# Patient Record
Sex: Female | Born: 1945
Health system: Southern US, Community
[De-identification: ages and names within clinical notes are randomized; demographics above are authoritative.]

## PROBLEM LIST (undated history)

## (undated) DIAGNOSIS — H269 Unspecified cataract: Secondary | ICD-10-CM

## (undated) DIAGNOSIS — E119 Type 2 diabetes mellitus without complications: Secondary | ICD-10-CM

## (undated) DIAGNOSIS — D332 Benign neoplasm of brain, unspecified: Secondary | ICD-10-CM

## (undated) DIAGNOSIS — J45909 Unspecified asthma, uncomplicated: Secondary | ICD-10-CM

## (undated) DIAGNOSIS — I10 Essential (primary) hypertension: Secondary | ICD-10-CM

## (undated) DIAGNOSIS — C55 Malignant neoplasm of uterus, part unspecified: Secondary | ICD-10-CM

## (undated) DIAGNOSIS — K219 Gastro-esophageal reflux disease without esophagitis: Secondary | ICD-10-CM

## (undated) DIAGNOSIS — R569 Unspecified convulsions: Secondary | ICD-10-CM

## (undated) DIAGNOSIS — Z9981 Dependence on supplemental oxygen: Secondary | ICD-10-CM

## (undated) DIAGNOSIS — J3089 Other allergic rhinitis: Secondary | ICD-10-CM

## (undated) HISTORY — PX: PARTIAL HYSTERECTOMY: SHX80

## (undated) HISTORY — PX: BRAIN SURGERY: SHX531

## (undated) HISTORY — DX: Malignant neoplasm of uterus, part unspecified: C55

## (undated) HISTORY — PX: DIAGNOSTIC LAPAROSCOPY: SUR761

## (undated) HISTORY — PX: TONSILLECTOMY: SUR1361

## (undated) HISTORY — PX: BREAST SURGERY: SHX581

## (undated) HISTORY — PX: BREAST BIOPSY: SHX20

---

## 2003-10-12 ENCOUNTER — Ambulatory Visit: Payer: Self-pay | Admitting: Internal Medicine

## 2004-04-16 ENCOUNTER — Emergency Department: Payer: Self-pay | Admitting: Emergency Medicine

## 2004-10-17 ENCOUNTER — Ambulatory Visit: Payer: Self-pay | Admitting: Internal Medicine

## 2005-10-18 ENCOUNTER — Ambulatory Visit: Payer: Self-pay

## 2005-10-29 ENCOUNTER — Ambulatory Visit: Payer: Self-pay

## 2006-05-15 ENCOUNTER — Ambulatory Visit: Payer: Self-pay | Admitting: Surgery

## 2006-05-30 ENCOUNTER — Ambulatory Visit: Payer: Self-pay | Admitting: Surgery

## 2006-08-15 ENCOUNTER — Ambulatory Visit: Payer: Self-pay | Admitting: Internal Medicine

## 2006-10-21 ENCOUNTER — Ambulatory Visit: Payer: Self-pay | Admitting: Internal Medicine

## 2007-10-23 ENCOUNTER — Ambulatory Visit: Payer: Self-pay | Admitting: Internal Medicine

## 2008-10-25 ENCOUNTER — Ambulatory Visit: Payer: Self-pay | Admitting: Internal Medicine

## 2009-01-13 ENCOUNTER — Ambulatory Visit: Payer: Self-pay | Admitting: Internal Medicine

## 2009-10-31 ENCOUNTER — Ambulatory Visit: Payer: Self-pay | Admitting: Internal Medicine

## 2010-11-23 ENCOUNTER — Ambulatory Visit: Payer: Self-pay | Admitting: Internal Medicine

## 2011-12-03 ENCOUNTER — Ambulatory Visit: Payer: Self-pay | Admitting: Physician Assistant

## 2012-02-11 ENCOUNTER — Ambulatory Visit: Payer: Self-pay | Admitting: Unknown Physician Specialty

## 2012-02-11 LAB — CLOSTRIDIUM DIFFICILE BY PCR

## 2012-02-12 LAB — PATHOLOGY REPORT

## 2012-03-13 ENCOUNTER — Ambulatory Visit: Payer: Self-pay | Admitting: Internal Medicine

## 2012-05-16 DIAGNOSIS — J309 Allergic rhinitis, unspecified: Secondary | ICD-10-CM | POA: Diagnosis not present

## 2012-05-30 DIAGNOSIS — J309 Allergic rhinitis, unspecified: Secondary | ICD-10-CM | POA: Diagnosis not present

## 2012-06-13 DIAGNOSIS — J309 Allergic rhinitis, unspecified: Secondary | ICD-10-CM | POA: Diagnosis not present

## 2012-06-19 DIAGNOSIS — I8310 Varicose veins of unspecified lower extremity with inflammation: Secondary | ICD-10-CM | POA: Diagnosis not present

## 2012-06-19 DIAGNOSIS — I10 Essential (primary) hypertension: Secondary | ICD-10-CM | POA: Diagnosis not present

## 2012-06-19 DIAGNOSIS — I6529 Occlusion and stenosis of unspecified carotid artery: Secondary | ICD-10-CM | POA: Diagnosis not present

## 2012-06-19 DIAGNOSIS — H6120 Impacted cerumen, unspecified ear: Secondary | ICD-10-CM | POA: Diagnosis not present

## 2012-06-19 DIAGNOSIS — Z006 Encounter for examination for normal comparison and control in clinical research program: Secondary | ICD-10-CM | POA: Diagnosis not present

## 2012-06-19 DIAGNOSIS — R42 Dizziness and giddiness: Secondary | ICD-10-CM | POA: Diagnosis not present

## 2012-06-19 DIAGNOSIS — G4700 Insomnia, unspecified: Secondary | ICD-10-CM | POA: Diagnosis not present

## 2012-06-27 DIAGNOSIS — R002 Palpitations: Secondary | ICD-10-CM | POA: Diagnosis not present

## 2012-06-27 DIAGNOSIS — I10 Essential (primary) hypertension: Secondary | ICD-10-CM | POA: Diagnosis not present

## 2012-06-27 DIAGNOSIS — R42 Dizziness and giddiness: Secondary | ICD-10-CM | POA: Diagnosis not present

## 2012-06-27 DIAGNOSIS — J309 Allergic rhinitis, unspecified: Secondary | ICD-10-CM | POA: Diagnosis not present

## 2012-07-10 DIAGNOSIS — J309 Allergic rhinitis, unspecified: Secondary | ICD-10-CM | POA: Diagnosis not present

## 2012-07-21 DIAGNOSIS — R002 Palpitations: Secondary | ICD-10-CM | POA: Diagnosis not present

## 2012-07-21 DIAGNOSIS — I10 Essential (primary) hypertension: Secondary | ICD-10-CM | POA: Diagnosis not present

## 2012-07-21 DIAGNOSIS — J309 Allergic rhinitis, unspecified: Secondary | ICD-10-CM | POA: Diagnosis not present

## 2012-07-21 DIAGNOSIS — R350 Frequency of micturition: Secondary | ICD-10-CM | POA: Diagnosis not present

## 2012-07-21 DIAGNOSIS — K219 Gastro-esophageal reflux disease without esophagitis: Secondary | ICD-10-CM | POA: Diagnosis not present

## 2012-07-24 DIAGNOSIS — R0602 Shortness of breath: Secondary | ICD-10-CM | POA: Diagnosis not present

## 2012-07-24 DIAGNOSIS — R079 Chest pain, unspecified: Secondary | ICD-10-CM | POA: Diagnosis not present

## 2012-07-24 DIAGNOSIS — J45909 Unspecified asthma, uncomplicated: Secondary | ICD-10-CM | POA: Diagnosis not present

## 2012-08-08 DIAGNOSIS — J309 Allergic rhinitis, unspecified: Secondary | ICD-10-CM | POA: Diagnosis not present

## 2012-08-22 DIAGNOSIS — J309 Allergic rhinitis, unspecified: Secondary | ICD-10-CM | POA: Diagnosis not present

## 2012-09-04 DIAGNOSIS — G479 Sleep disorder, unspecified: Secondary | ICD-10-CM | POA: Diagnosis not present

## 2012-09-04 DIAGNOSIS — E119 Type 2 diabetes mellitus without complications: Secondary | ICD-10-CM | POA: Diagnosis not present

## 2012-09-04 DIAGNOSIS — J45909 Unspecified asthma, uncomplicated: Secondary | ICD-10-CM | POA: Diagnosis not present

## 2012-09-04 DIAGNOSIS — J309 Allergic rhinitis, unspecified: Secondary | ICD-10-CM | POA: Diagnosis not present

## 2012-09-04 DIAGNOSIS — M79609 Pain in unspecified limb: Secondary | ICD-10-CM | POA: Diagnosis not present

## 2012-09-11 DIAGNOSIS — M7989 Other specified soft tissue disorders: Secondary | ICD-10-CM | POA: Diagnosis not present

## 2012-09-11 DIAGNOSIS — M79609 Pain in unspecified limb: Secondary | ICD-10-CM | POA: Diagnosis not present

## 2012-09-26 DIAGNOSIS — J309 Allergic rhinitis, unspecified: Secondary | ICD-10-CM | POA: Diagnosis not present

## 2012-10-13 DIAGNOSIS — Z23 Encounter for immunization: Secondary | ICD-10-CM | POA: Diagnosis not present

## 2012-10-23 DIAGNOSIS — E669 Obesity, unspecified: Secondary | ICD-10-CM | POA: Diagnosis not present

## 2012-10-23 DIAGNOSIS — J45991 Cough variant asthma: Secondary | ICD-10-CM | POA: Diagnosis not present

## 2012-10-23 DIAGNOSIS — M7989 Other specified soft tissue disorders: Secondary | ICD-10-CM | POA: Diagnosis not present

## 2012-10-23 DIAGNOSIS — M25569 Pain in unspecified knee: Secondary | ICD-10-CM | POA: Diagnosis not present

## 2012-10-23 DIAGNOSIS — J309 Allergic rhinitis, unspecified: Secondary | ICD-10-CM | POA: Diagnosis not present

## 2012-10-23 DIAGNOSIS — I10 Essential (primary) hypertension: Secondary | ICD-10-CM | POA: Diagnosis not present

## 2012-10-23 DIAGNOSIS — K219 Gastro-esophageal reflux disease without esophagitis: Secondary | ICD-10-CM | POA: Diagnosis not present

## 2012-10-23 DIAGNOSIS — E119 Type 2 diabetes mellitus without complications: Secondary | ICD-10-CM | POA: Diagnosis not present

## 2012-10-24 ENCOUNTER — Ambulatory Visit: Payer: Self-pay | Admitting: Physician Assistant

## 2012-10-24 DIAGNOSIS — M1710 Unilateral primary osteoarthritis, unspecified knee: Secondary | ICD-10-CM | POA: Diagnosis not present

## 2012-10-24 DIAGNOSIS — M25569 Pain in unspecified knee: Secondary | ICD-10-CM | POA: Diagnosis not present

## 2012-11-04 DIAGNOSIS — J45909 Unspecified asthma, uncomplicated: Secondary | ICD-10-CM | POA: Diagnosis not present

## 2012-11-06 DIAGNOSIS — G479 Sleep disorder, unspecified: Secondary | ICD-10-CM | POA: Diagnosis not present

## 2012-11-06 DIAGNOSIS — J309 Allergic rhinitis, unspecified: Secondary | ICD-10-CM | POA: Diagnosis not present

## 2012-11-06 DIAGNOSIS — J45991 Cough variant asthma: Secondary | ICD-10-CM | POA: Diagnosis not present

## 2012-11-19 DIAGNOSIS — J309 Allergic rhinitis, unspecified: Secondary | ICD-10-CM | POA: Diagnosis not present

## 2012-12-03 ENCOUNTER — Ambulatory Visit: Payer: Self-pay | Admitting: Physician Assistant

## 2012-12-03 DIAGNOSIS — R0602 Shortness of breath: Secondary | ICD-10-CM | POA: Diagnosis not present

## 2012-12-03 DIAGNOSIS — Z1231 Encounter for screening mammogram for malignant neoplasm of breast: Secondary | ICD-10-CM | POA: Diagnosis not present

## 2012-12-12 DIAGNOSIS — I10 Essential (primary) hypertension: Secondary | ICD-10-CM | POA: Diagnosis not present

## 2012-12-12 DIAGNOSIS — E782 Mixed hyperlipidemia: Secondary | ICD-10-CM | POA: Diagnosis not present

## 2012-12-23 DIAGNOSIS — I10 Essential (primary) hypertension: Secondary | ICD-10-CM | POA: Diagnosis not present

## 2012-12-23 DIAGNOSIS — J301 Allergic rhinitis due to pollen: Secondary | ICD-10-CM | POA: Diagnosis not present

## 2012-12-23 DIAGNOSIS — J45991 Cough variant asthma: Secondary | ICD-10-CM | POA: Diagnosis not present

## 2012-12-23 DIAGNOSIS — E669 Obesity, unspecified: Secondary | ICD-10-CM | POA: Diagnosis not present

## 2012-12-23 DIAGNOSIS — M25569 Pain in unspecified knee: Secondary | ICD-10-CM | POA: Diagnosis not present

## 2012-12-23 DIAGNOSIS — J309 Allergic rhinitis, unspecified: Secondary | ICD-10-CM | POA: Diagnosis not present

## 2013-01-20 DIAGNOSIS — J309 Allergic rhinitis, unspecified: Secondary | ICD-10-CM | POA: Diagnosis not present

## 2013-02-17 DIAGNOSIS — J309 Allergic rhinitis, unspecified: Secondary | ICD-10-CM | POA: Diagnosis not present

## 2013-03-09 DIAGNOSIS — Z6841 Body Mass Index (BMI) 40.0 and over, adult: Secondary | ICD-10-CM | POA: Diagnosis not present

## 2013-03-09 DIAGNOSIS — J309 Allergic rhinitis, unspecified: Secondary | ICD-10-CM | POA: Diagnosis not present

## 2013-03-09 DIAGNOSIS — G479 Sleep disorder, unspecified: Secondary | ICD-10-CM | POA: Diagnosis not present

## 2013-03-09 DIAGNOSIS — J45991 Cough variant asthma: Secondary | ICD-10-CM | POA: Diagnosis not present

## 2013-03-24 DIAGNOSIS — J45991 Cough variant asthma: Secondary | ICD-10-CM | POA: Diagnosis not present

## 2013-03-24 DIAGNOSIS — J309 Allergic rhinitis, unspecified: Secondary | ICD-10-CM | POA: Diagnosis not present

## 2013-03-24 DIAGNOSIS — E119 Type 2 diabetes mellitus without complications: Secondary | ICD-10-CM | POA: Diagnosis not present

## 2013-03-24 DIAGNOSIS — Z6841 Body Mass Index (BMI) 40.0 and over, adult: Secondary | ICD-10-CM | POA: Diagnosis not present

## 2013-03-24 DIAGNOSIS — K219 Gastro-esophageal reflux disease without esophagitis: Secondary | ICD-10-CM | POA: Diagnosis not present

## 2013-03-24 DIAGNOSIS — I10 Essential (primary) hypertension: Secondary | ICD-10-CM | POA: Diagnosis not present

## 2013-03-24 DIAGNOSIS — E669 Obesity, unspecified: Secondary | ICD-10-CM | POA: Diagnosis not present

## 2013-04-09 DIAGNOSIS — J309 Allergic rhinitis, unspecified: Secondary | ICD-10-CM | POA: Diagnosis not present

## 2013-05-13 DIAGNOSIS — J309 Allergic rhinitis, unspecified: Secondary | ICD-10-CM | POA: Diagnosis not present

## 2013-07-09 DIAGNOSIS — J309 Allergic rhinitis, unspecified: Secondary | ICD-10-CM | POA: Diagnosis not present

## 2013-07-09 DIAGNOSIS — J45991 Cough variant asthma: Secondary | ICD-10-CM | POA: Diagnosis not present

## 2013-07-09 DIAGNOSIS — G4730 Sleep apnea, unspecified: Secondary | ICD-10-CM | POA: Diagnosis not present

## 2013-07-09 DIAGNOSIS — G4710 Hypersomnia, unspecified: Secondary | ICD-10-CM | POA: Diagnosis not present

## 2013-07-23 DIAGNOSIS — J309 Allergic rhinitis, unspecified: Secondary | ICD-10-CM | POA: Diagnosis not present

## 2013-07-23 DIAGNOSIS — M25569 Pain in unspecified knee: Secondary | ICD-10-CM | POA: Diagnosis not present

## 2013-07-23 DIAGNOSIS — J45991 Cough variant asthma: Secondary | ICD-10-CM | POA: Diagnosis not present

## 2013-07-23 DIAGNOSIS — Z6841 Body Mass Index (BMI) 40.0 and over, adult: Secondary | ICD-10-CM | POA: Diagnosis not present

## 2013-07-23 DIAGNOSIS — I10 Essential (primary) hypertension: Secondary | ICD-10-CM | POA: Diagnosis not present

## 2013-07-23 DIAGNOSIS — E119 Type 2 diabetes mellitus without complications: Secondary | ICD-10-CM | POA: Diagnosis not present

## 2013-10-20 DIAGNOSIS — Z23 Encounter for immunization: Secondary | ICD-10-CM | POA: Diagnosis not present

## 2013-11-17 DIAGNOSIS — Z0001 Encounter for general adult medical examination with abnormal findings: Secondary | ICD-10-CM | POA: Diagnosis not present

## 2013-11-17 DIAGNOSIS — I10 Essential (primary) hypertension: Secondary | ICD-10-CM | POA: Diagnosis not present

## 2013-11-17 DIAGNOSIS — E119 Type 2 diabetes mellitus without complications: Secondary | ICD-10-CM | POA: Diagnosis not present

## 2013-11-26 DIAGNOSIS — I10 Essential (primary) hypertension: Secondary | ICD-10-CM | POA: Diagnosis not present

## 2013-11-26 DIAGNOSIS — Z6841 Body Mass Index (BMI) 40.0 and over, adult: Secondary | ICD-10-CM | POA: Diagnosis not present

## 2013-11-26 DIAGNOSIS — E1165 Type 2 diabetes mellitus with hyperglycemia: Secondary | ICD-10-CM | POA: Diagnosis not present

## 2013-11-26 DIAGNOSIS — J301 Allergic rhinitis due to pollen: Secondary | ICD-10-CM | POA: Diagnosis not present

## 2013-11-26 DIAGNOSIS — J45991 Cough variant asthma: Secondary | ICD-10-CM | POA: Diagnosis not present

## 2013-11-26 DIAGNOSIS — E780 Pure hypercholesterolemia: Secondary | ICD-10-CM | POA: Diagnosis not present

## 2013-11-26 DIAGNOSIS — G4730 Sleep apnea, unspecified: Secondary | ICD-10-CM | POA: Diagnosis not present

## 2013-12-17 DIAGNOSIS — Z1231 Encounter for screening mammogram for malignant neoplasm of breast: Secondary | ICD-10-CM | POA: Diagnosis not present

## 2013-12-17 DIAGNOSIS — R922 Inconclusive mammogram: Secondary | ICD-10-CM | POA: Diagnosis not present

## 2013-12-17 DIAGNOSIS — Z9289 Personal history of other medical treatment: Secondary | ICD-10-CM | POA: Diagnosis not present

## 2014-01-14 DIAGNOSIS — G4730 Sleep apnea, unspecified: Secondary | ICD-10-CM | POA: Diagnosis not present

## 2014-01-14 DIAGNOSIS — J301 Allergic rhinitis due to pollen: Secondary | ICD-10-CM | POA: Diagnosis not present

## 2014-01-14 DIAGNOSIS — J4530 Mild persistent asthma, uncomplicated: Secondary | ICD-10-CM | POA: Diagnosis not present

## 2014-01-20 DIAGNOSIS — G4733 Obstructive sleep apnea (adult) (pediatric): Secondary | ICD-10-CM | POA: Diagnosis not present

## 2014-04-01 DIAGNOSIS — I10 Essential (primary) hypertension: Secondary | ICD-10-CM | POA: Diagnosis not present

## 2014-04-01 DIAGNOSIS — E119 Type 2 diabetes mellitus without complications: Secondary | ICD-10-CM | POA: Diagnosis not present

## 2014-04-01 DIAGNOSIS — J301 Allergic rhinitis due to pollen: Secondary | ICD-10-CM | POA: Diagnosis not present

## 2014-04-01 DIAGNOSIS — M25562 Pain in left knee: Secondary | ICD-10-CM | POA: Diagnosis not present

## 2014-04-01 DIAGNOSIS — J4530 Mild persistent asthma, uncomplicated: Secondary | ICD-10-CM | POA: Diagnosis not present

## 2014-08-03 DIAGNOSIS — M109 Gout, unspecified: Secondary | ICD-10-CM | POA: Diagnosis not present

## 2014-08-03 DIAGNOSIS — E6609 Other obesity due to excess calories: Secondary | ICD-10-CM | POA: Diagnosis not present

## 2014-08-03 DIAGNOSIS — M1991 Primary osteoarthritis, unspecified site: Secondary | ICD-10-CM | POA: Diagnosis not present

## 2014-08-03 DIAGNOSIS — K219 Gastro-esophageal reflux disease without esophagitis: Secondary | ICD-10-CM | POA: Diagnosis not present

## 2014-08-03 DIAGNOSIS — E119 Type 2 diabetes mellitus without complications: Secondary | ICD-10-CM | POA: Diagnosis not present

## 2014-08-03 DIAGNOSIS — Z0001 Encounter for general adult medical examination with abnormal findings: Secondary | ICD-10-CM | POA: Diagnosis not present

## 2014-08-03 DIAGNOSIS — J4530 Mild persistent asthma, uncomplicated: Secondary | ICD-10-CM | POA: Diagnosis not present

## 2014-08-03 DIAGNOSIS — E2839 Other primary ovarian failure: Secondary | ICD-10-CM | POA: Diagnosis not present

## 2014-08-12 DIAGNOSIS — E2839 Other primary ovarian failure: Secondary | ICD-10-CM | POA: Diagnosis not present

## 2014-08-12 DIAGNOSIS — M8588 Other specified disorders of bone density and structure, other site: Secondary | ICD-10-CM | POA: Diagnosis not present

## 2014-08-19 DIAGNOSIS — G479 Sleep disorder, unspecified: Secondary | ICD-10-CM | POA: Diagnosis not present

## 2014-08-19 DIAGNOSIS — Z6841 Body Mass Index (BMI) 40.0 and over, adult: Secondary | ICD-10-CM | POA: Diagnosis not present

## 2014-08-19 DIAGNOSIS — J4530 Mild persistent asthma, uncomplicated: Secondary | ICD-10-CM | POA: Diagnosis not present

## 2014-10-19 DIAGNOSIS — Z23 Encounter for immunization: Secondary | ICD-10-CM | POA: Diagnosis not present

## 2014-11-03 DIAGNOSIS — L820 Inflamed seborrheic keratosis: Secondary | ICD-10-CM | POA: Diagnosis not present

## 2014-11-03 DIAGNOSIS — L720 Epidermal cyst: Secondary | ICD-10-CM | POA: Diagnosis not present

## 2014-11-03 DIAGNOSIS — L859 Epidermal thickening, unspecified: Secondary | ICD-10-CM | POA: Diagnosis not present

## 2014-11-03 DIAGNOSIS — L923 Foreign body granuloma of the skin and subcutaneous tissue: Secondary | ICD-10-CM | POA: Diagnosis not present

## 2014-11-23 DIAGNOSIS — Z9689 Presence of other specified functional implants: Secondary | ICD-10-CM | POA: Diagnosis not present

## 2014-11-23 DIAGNOSIS — D496 Neoplasm of unspecified behavior of brain: Secondary | ICD-10-CM | POA: Diagnosis not present

## 2014-11-23 DIAGNOSIS — T814XXA Infection following a procedure, initial encounter: Secondary | ICD-10-CM | POA: Diagnosis not present

## 2014-12-07 DIAGNOSIS — J301 Allergic rhinitis due to pollen: Secondary | ICD-10-CM | POA: Diagnosis not present

## 2014-12-07 DIAGNOSIS — Z23 Encounter for immunization: Secondary | ICD-10-CM | POA: Diagnosis not present

## 2014-12-07 DIAGNOSIS — B372 Candidiasis of skin and nail: Secondary | ICD-10-CM | POA: Diagnosis not present

## 2014-12-07 DIAGNOSIS — Z86011 Personal history of benign neoplasm of the brain: Secondary | ICD-10-CM | POA: Diagnosis not present

## 2014-12-07 DIAGNOSIS — R7301 Impaired fasting glucose: Secondary | ICD-10-CM | POA: Diagnosis not present

## 2014-12-07 DIAGNOSIS — E6609 Other obesity due to excess calories: Secondary | ICD-10-CM | POA: Diagnosis not present

## 2014-12-07 DIAGNOSIS — I10 Essential (primary) hypertension: Secondary | ICD-10-CM | POA: Diagnosis not present

## 2014-12-07 DIAGNOSIS — J4530 Mild persistent asthma, uncomplicated: Secondary | ICD-10-CM | POA: Diagnosis not present

## 2014-12-07 DIAGNOSIS — E782 Mixed hyperlipidemia: Secondary | ICD-10-CM | POA: Diagnosis not present

## 2014-12-07 DIAGNOSIS — Z0001 Encounter for general adult medical examination with abnormal findings: Secondary | ICD-10-CM | POA: Diagnosis not present

## 2014-12-07 DIAGNOSIS — E559 Vitamin D deficiency, unspecified: Secondary | ICD-10-CM | POA: Diagnosis not present

## 2014-12-21 DIAGNOSIS — Z1231 Encounter for screening mammogram for malignant neoplasm of breast: Secondary | ICD-10-CM | POA: Diagnosis not present

## 2014-12-21 DIAGNOSIS — R921 Mammographic calcification found on diagnostic imaging of breast: Secondary | ICD-10-CM | POA: Diagnosis not present

## 2015-01-06 DIAGNOSIS — L923 Foreign body granuloma of the skin and subcutaneous tissue: Secondary | ICD-10-CM | POA: Diagnosis not present

## 2015-01-06 DIAGNOSIS — L918 Other hypertrophic disorders of the skin: Secondary | ICD-10-CM | POA: Diagnosis not present

## 2015-01-06 DIAGNOSIS — L820 Inflamed seborrheic keratosis: Secondary | ICD-10-CM | POA: Diagnosis not present

## 2015-01-14 DIAGNOSIS — M545 Low back pain: Secondary | ICD-10-CM | POA: Diagnosis not present

## 2015-01-14 DIAGNOSIS — T849XXS Unspecified complication of internal orthopedic prosthetic device, implant and graft, sequela: Secondary | ICD-10-CM | POA: Diagnosis not present

## 2015-02-02 DIAGNOSIS — T847XXA Infection and inflammatory reaction due to other internal orthopedic prosthetic devices, implants and grafts, initial encounter: Secondary | ICD-10-CM | POA: Diagnosis not present

## 2015-02-02 DIAGNOSIS — Z6833 Body mass index (BMI) 33.0-33.9, adult: Secondary | ICD-10-CM | POA: Diagnosis not present

## 2015-02-02 DIAGNOSIS — R030 Elevated blood-pressure reading, without diagnosis of hypertension: Secondary | ICD-10-CM | POA: Diagnosis not present

## 2015-02-07 ENCOUNTER — Other Ambulatory Visit (HOSPITAL_COMMUNITY): Payer: Self-pay | Admitting: Neurosurgery

## 2015-02-09 ENCOUNTER — Encounter (HOSPITAL_COMMUNITY): Payer: Self-pay | Admitting: *Deleted

## 2015-02-09 MED ORDER — CEFAZOLIN SODIUM-DEXTROSE 2-3 GM-% IV SOLR
2.0000 g | INTRAVENOUS | Status: AC
  Administered 2015-02-10: 2 g via INTRAVENOUS

## 2015-02-09 NOTE — Progress Notes (Signed)
Pt denies SOB, chest pain, and being under the care of a cardiologist. Pt stated that she had a stress test but not sure when, denies having a cardiac cath and is unsure if she had an echo; records requested from Dr. Clayborn Bigness at [facility] in [place], [place]. Pt made aware to stop taking Aspirin, otc vitamins, fish oil and herbal medications. Do not take any NSAIDs ie: Ibuprofen, Advil, Naproxen or any medication containing Aspirin. Pt verbalized understanding of all pre-op instructions.

## 2015-02-09 NOTE — Progress Notes (Signed)
Pt denies having a chest x ray and EKG within the last year. 

## 2015-02-10 ENCOUNTER — Encounter (HOSPITAL_COMMUNITY)
Admission: RE | Disposition: A | Discharge status: Home / Self Care | Payer: Self-pay | Source: Ambulatory Visit | Attending: Neurosurgery

## 2015-02-10 ENCOUNTER — Inpatient Hospital Stay (HOSPITAL_COMMUNITY): Payer: Medicare Other | Admitting: Certified Registered"

## 2015-02-10 ENCOUNTER — Encounter (HOSPITAL_COMMUNITY): Payer: Self-pay | Admitting: Certified Registered"

## 2015-02-10 ENCOUNTER — Ambulatory Visit (HOSPITAL_COMMUNITY)
Admission: RE | Admit: 2015-02-10 | Discharge: 2015-02-10 | Disposition: A | Discharge status: Home / Self Care | Payer: Medicare Other | Source: Ambulatory Visit | Attending: Neurosurgery | Admitting: Neurosurgery

## 2015-02-10 DIAGNOSIS — K219 Gastro-esophageal reflux disease without esophagitis: Secondary | ICD-10-CM | POA: Diagnosis not present

## 2015-02-10 DIAGNOSIS — T84498A Other mechanical complication of other internal orthopedic devices, implants and grafts, initial encounter: Secondary | ICD-10-CM | POA: Diagnosis present

## 2015-02-10 DIAGNOSIS — T8132XA Disruption of internal operation (surgical) wound, not elsewhere classified, initial encounter: Secondary | ICD-10-CM | POA: Diagnosis not present

## 2015-02-10 DIAGNOSIS — I10 Essential (primary) hypertension: Secondary | ICD-10-CM | POA: Diagnosis not present

## 2015-02-10 DIAGNOSIS — T8131XA Disruption of external operation (surgical) wound, not elsewhere classified, initial encounter: Secondary | ICD-10-CM | POA: Insufficient documentation

## 2015-02-10 DIAGNOSIS — E119 Type 2 diabetes mellitus without complications: Secondary | ICD-10-CM | POA: Insufficient documentation

## 2015-02-10 DIAGNOSIS — T8489XA Other specified complication of internal orthopedic prosthetic devices, implants and grafts, initial encounter: Secondary | ICD-10-CM | POA: Diagnosis not present

## 2015-02-10 DIAGNOSIS — J45909 Unspecified asthma, uncomplicated: Secondary | ICD-10-CM | POA: Insufficient documentation

## 2015-02-10 DIAGNOSIS — Z9981 Dependence on supplemental oxygen: Secondary | ICD-10-CM | POA: Diagnosis not present

## 2015-02-10 DIAGNOSIS — Z7982 Long term (current) use of aspirin: Secondary | ICD-10-CM | POA: Insufficient documentation

## 2015-02-10 DIAGNOSIS — Z87891 Personal history of nicotine dependence: Secondary | ICD-10-CM | POA: Diagnosis not present

## 2015-02-10 DIAGNOSIS — Y834 Other reconstructive surgery as the cause of abnormal reaction of the patient, or of later complication, without mention of misadventure at the time of the procedure: Secondary | ICD-10-CM | POA: Insufficient documentation

## 2015-02-10 DIAGNOSIS — Z79899 Other long term (current) drug therapy: Secondary | ICD-10-CM | POA: Insufficient documentation

## 2015-02-10 DIAGNOSIS — M868X8 Other osteomyelitis, other site: Secondary | ICD-10-CM | POA: Diagnosis not present

## 2015-02-10 DIAGNOSIS — M869 Osteomyelitis, unspecified: Secondary | ICD-10-CM | POA: Diagnosis not present

## 2015-02-10 HISTORY — DX: Type 2 diabetes mellitus without complications: E11.9

## 2015-02-10 HISTORY — DX: Unspecified convulsions: R56.9

## 2015-02-10 HISTORY — DX: Gastro-esophageal reflux disease without esophagitis: K21.9

## 2015-02-10 HISTORY — DX: Dependence on supplemental oxygen: Z99.81

## 2015-02-10 HISTORY — DX: Benign neoplasm of brain, unspecified: D33.2

## 2015-02-10 HISTORY — DX: Unspecified asthma, uncomplicated: J45.909

## 2015-02-10 HISTORY — DX: Unspecified cataract: H26.9

## 2015-02-10 HISTORY — DX: Other allergic rhinitis: J30.89

## 2015-02-10 HISTORY — PX: CRANIOTOMY: SHX93

## 2015-02-10 HISTORY — DX: Essential (primary) hypertension: I10

## 2015-02-10 LAB — GLUCOSE, CAPILLARY
GLUCOSE-CAPILLARY: 115 mg/dL — AB (ref 65–99)
Glucose-Capillary: 104 mg/dL — ABNORMAL HIGH (ref 65–99)

## 2015-02-10 LAB — BASIC METABOLIC PANEL
Anion gap: 11 (ref 5–15)
BUN: 15 mg/dL (ref 6–20)
CALCIUM: 9.3 mg/dL (ref 8.9–10.3)
CO2: 27 mmol/L (ref 22–32)
CREATININE: 0.72 mg/dL (ref 0.44–1.00)
Chloride: 103 mmol/L (ref 101–111)
GFR calc Af Amer: >60 mL/min (ref >60)
GFR calc non Af Amer: >60 mL/min (ref >60)
GLUCOSE: 111 mg/dL — AB (ref 65–99)
Potassium: 3.8 mmol/L (ref 3.5–5.1)
Sodium: 141 mmol/L (ref 135–145)

## 2015-02-10 LAB — CBC
HEMATOCRIT: 42.1 % (ref 36.0–46.0)
Hemoglobin: 13.9 g/dL (ref 12.0–15.0)
MCH: 29.2 pg (ref 26.0–34.0)
MCHC: 33 g/dL (ref 30.0–36.0)
MCV: 88.4 fL (ref 78.0–100.0)
Platelets: 264 10*3/uL (ref 150–400)
RBC: 4.76 MIL/uL (ref 3.87–5.11)
RDW: 14.2 % (ref 11.5–15.5)
WBC: 6.3 10*3/uL (ref 4.0–10.5)

## 2015-02-10 SURGERY — CRANIOTOMY BONE FLAP/PROSTHETIC PLATE
Anesthesia: General

## 2015-02-10 MED ORDER — HYDROCODONE-ACETAMINOPHEN 5-325 MG PO TABS: 1.0000 | ORAL_TABLET | ORAL | Status: DC | PRN

## 2015-02-10 MED ORDER — SUGAMMADEX SODIUM 200 MG/2ML IV SOLN
INTRAVENOUS | Status: AC
  Filled 2015-02-10: qty 2

## 2015-02-10 MED ORDER — HYDROCODONE-ACETAMINOPHEN 5-325 MG PO TABS: 1.0000 | ORAL_TABLET | Freq: Four times a day (QID) | ORAL | Status: DC | PRN

## 2015-02-10 MED ORDER — FENTANYL CITRATE (PF) 100 MCG/2ML IJ SOLN
INTRAMUSCULAR | Status: AC
  Filled 2015-02-10: qty 2

## 2015-02-10 MED ORDER — HEMOSTATIC AGENTS (NO CHARGE) OPTIME
TOPICAL | Status: DC | PRN
  Administered 2015-02-10: 1 via TOPICAL

## 2015-02-10 MED ORDER — FENTANYL CITRATE (PF) 100 MCG/2ML IJ SOLN
25.0000 ug | INTRAMUSCULAR | Status: DC | PRN
  Administered 2015-02-10: 25 ug via INTRAVENOUS
  Administered 2015-02-10: 50 ug via INTRAVENOUS

## 2015-02-10 MED ORDER — SUGAMMADEX SODIUM 200 MG/2ML IV SOLN
INTRAVENOUS | Status: DC | PRN
Start: 2015-02-10 — End: 2015-02-10
  Administered 2015-02-10: 180 mg via INTRAVENOUS

## 2015-02-10 MED ORDER — 0.9 % SODIUM CHLORIDE (POUR BTL) OPTIME
TOPICAL | Status: DC | PRN
  Administered 2015-02-10: 1000 mL

## 2015-02-10 MED ORDER — ONDANSETRON HCL 4 MG/2ML IJ SOLN
INTRAMUSCULAR | Status: DC | PRN
  Administered 2015-02-10: 4 mg via INTRAVENOUS

## 2015-02-10 MED ORDER — OXYCODONE-ACETAMINOPHEN 5-325 MG PO TABS
ORAL_TABLET | ORAL | Status: AC
  Filled 2015-02-10: qty 2

## 2015-02-10 MED ORDER — ONDANSETRON HCL 4 MG/2ML IJ SOLN
INTRAMUSCULAR | Status: AC
  Filled 2015-02-10: qty 2

## 2015-02-10 MED ORDER — CEFAZOLIN SODIUM-DEXTROSE 2-3 GM-% IV SOLR
INTRAVENOUS | Status: AC
  Filled 2015-02-10: qty 50

## 2015-02-10 MED ORDER — DOXYCYCLINE HYCLATE 50 MG PO CAPS: 50.0000 mg | ORAL_CAPSULE | Freq: Two times a day (BID) | ORAL | Status: DC

## 2015-02-10 MED ORDER — OXYCODONE-ACETAMINOPHEN 5-325 MG PO TABS
1.0000 | ORAL_TABLET | ORAL | Status: DC | PRN
  Administered 2015-02-10: 2 via ORAL

## 2015-02-10 MED ORDER — LIDOCAINE HCL (CARDIAC) 20 MG/ML IV SOLN
INTRAVENOUS | Status: AC
  Filled 2015-02-10: qty 5

## 2015-02-10 MED ORDER — PROPOFOL 10 MG/ML IV BOLUS
INTRAVENOUS | Status: DC | PRN
  Administered 2015-02-10: 140 mg via INTRAVENOUS

## 2015-02-10 MED ORDER — MIDAZOLAM HCL 5 MG/5ML IJ SOLN
INTRAMUSCULAR | Status: DC | PRN
  Administered 2015-02-10: 1 mg via INTRAVENOUS

## 2015-02-10 MED ORDER — ROCURONIUM BROMIDE 50 MG/5ML IV SOLN
INTRAVENOUS | Status: AC
  Filled 2015-02-10: qty 1

## 2015-02-10 MED ORDER — MIDAZOLAM HCL 2 MG/2ML IJ SOLN
INTRAMUSCULAR | Status: AC
  Filled 2015-02-10: qty 2

## 2015-02-10 MED ORDER — FENTANYL CITRATE (PF) 100 MCG/2ML IJ SOLN
INTRAMUSCULAR | Status: DC | PRN
  Administered 2015-02-10: 100 ug via INTRAVENOUS
  Administered 2015-02-10 (×3): 50 ug via INTRAVENOUS

## 2015-02-10 MED ORDER — PROPOFOL 10 MG/ML IV BOLUS
INTRAVENOUS | Status: AC
  Filled 2015-02-10: qty 20

## 2015-02-10 MED ORDER — SODIUM CHLORIDE 0.9 % IR SOLN
Status: DC | PRN
  Administered 2015-02-10: 09:00:00

## 2015-02-10 MED ORDER — ROCURONIUM BROMIDE 100 MG/10ML IV SOLN
INTRAVENOUS | Status: DC | PRN
  Administered 2015-02-10: 35 mg via INTRAVENOUS
  Administered 2015-02-10: 15 mg via INTRAVENOUS

## 2015-02-10 MED ORDER — LIDOCAINE HCL (CARDIAC) 20 MG/ML IV SOLN
INTRAVENOUS | Status: DC | PRN
  Administered 2015-02-10: 65 mg via INTRAVENOUS

## 2015-02-10 MED ORDER — LACTATED RINGERS IV SOLN
INTRAVENOUS | Status: DC | PRN
  Administered 2015-02-10: 08:00:00 via INTRAVENOUS

## 2015-02-10 MED ORDER — FENTANYL CITRATE (PF) 250 MCG/5ML IJ SOLN
INTRAMUSCULAR | Status: AC
  Filled 2015-02-10: qty 5

## 2015-02-10 MED ORDER — THROMBIN 5000 UNITS EX SOLR
CUTANEOUS | Status: DC | PRN
  Administered 2015-02-10 (×2): 5000 [IU] via TOPICAL

## 2015-02-10 MED ORDER — CYCLOBENZAPRINE HCL 10 MG PO TABS: 10.0000 mg | ORAL_TABLET | Freq: Three times a day (TID) | ORAL | Status: DC | PRN

## 2015-02-10 MED ORDER — DEXAMETHASONE SODIUM PHOSPHATE 10 MG/ML IJ SOLN
10.0000 mg | INTRAMUSCULAR | Status: AC
  Administered 2015-02-10: 10 mg via INTRAVENOUS
  Filled 2015-02-10: qty 1

## 2015-02-10 SURGICAL SUPPLY — 61 items
BAG DECANTER FOR FLEXI CONT (MISCELLANEOUS) ×3 IMPLANT
BLADE SURG 10 STRL SS (BLADE) ×3 IMPLANT
BLADE SURG 15 STRL LF DISP TIS (BLADE) ×1 IMPLANT
BLADE SURG 15 STRL SS (BLADE) ×2
BNDG COHESIVE 4X5 TAN NS LF (GAUZE/BANDAGES/DRESSINGS) IMPLANT
BRUSH SCRUB EZ 1% IODOPHOR (MISCELLANEOUS) IMPLANT
BRUSH SCRUB EZ PLAIN DRY (MISCELLANEOUS) ×3 IMPLANT
CANISTER SUCT 3000ML PPV (MISCELLANEOUS) ×3 IMPLANT
CLIP RANEY DISP (INSTRUMENTS) ×9 IMPLANT
CORDS BIPOLAR (ELECTRODE) ×3 IMPLANT
COVER BACK TABLE 60X90IN (DRAPES) IMPLANT
COVER MAYO STAND STRL (DRAPES) ×3 IMPLANT
DRAIN JACKSON PRATT 1/4 1325 (MISCELLANEOUS) IMPLANT
DRAPE INCISE IOBAN 66X45 STRL (DRAPES) ×3 IMPLANT
DRAPE NEUROLOGICAL W/INCISE (DRAPES) ×3 IMPLANT
DRAPE POUCH INSTRU U-SHP 10X18 (DRAPES) ×3 IMPLANT
DRAPE SURG 17X23 STRL (DRAPES) IMPLANT
ELECT CAUTERY BLADE 6.4 (BLADE) IMPLANT
ELECT REM PT RETURN 9FT ADLT (ELECTROSURGICAL) ×3
ELECTRODE REM PT RTRN 9FT ADLT (ELECTROSURGICAL) ×1 IMPLANT
EVACUATOR SILICONE 100CC (DRAIN) IMPLANT
GAUZE SPONGE 4X4 12PLY STRL (GAUZE/BANDAGES/DRESSINGS) ×3 IMPLANT
GAUZE SPONGE 4X4 16PLY XRAY LF (GAUZE/BANDAGES/DRESSINGS) ×6 IMPLANT
GLOVE ECLIPSE 9.0 STRL (GLOVE) ×3 IMPLANT
GLOVE EXAM NITRILE LRG STRL (GLOVE) IMPLANT
GLOVE EXAM NITRILE MD LF STRL (GLOVE) IMPLANT
GLOVE EXAM NITRILE XL STR (GLOVE) IMPLANT
GLOVE EXAM NITRILE XS STR PU (GLOVE) IMPLANT
GOWN STRL REUS W/ TWL LRG LVL3 (GOWN DISPOSABLE) ×1 IMPLANT
GOWN STRL REUS W/ TWL XL LVL3 (GOWN DISPOSABLE) ×2 IMPLANT
GOWN STRL REUS W/TWL 2XL LVL3 (GOWN DISPOSABLE) IMPLANT
GOWN STRL REUS W/TWL LRG LVL3 (GOWN DISPOSABLE) ×2
GOWN STRL REUS W/TWL XL LVL3 (GOWN DISPOSABLE) ×4
HEMOSTAT SURGICEL 2X14 (HEMOSTASIS) IMPLANT
KIT BASIN OR (CUSTOM PROCEDURE TRAY) ×3 IMPLANT
KIT ROOM TURNOVER OR (KITS) ×3 IMPLANT
LIQUID BAND (GAUZE/BANDAGES/DRESSINGS) ×3 IMPLANT
NEEDLE HYPO 25X1 1.5 SAFETY (NEEDLE) ×3 IMPLANT
NS IRRIG 1000ML POUR BTL (IV SOLUTION) ×3 IMPLANT
PACK EENT II TURBAN DRAPE (CUSTOM PROCEDURE TRAY) ×3 IMPLANT
PAD ARMBOARD 7.5X6 YLW CONV (MISCELLANEOUS) ×9 IMPLANT
PENCIL BUTTON HOLSTER BLD 10FT (ELECTRODE) ×3 IMPLANT
PIN MAYFIELD SKULL DISP (PIN) IMPLANT
RUBBERBAND STERILE (MISCELLANEOUS) IMPLANT
SPONGE LAP 4X18 X RAY DECT (DISPOSABLE) ×3 IMPLANT
SPONGE SURGIFOAM ABS GEL SZ50 (HEMOSTASIS) IMPLANT
STAPLER VISISTAT 35W (STAPLE) ×3 IMPLANT
STOCKINETTE 6  STRL (DRAPES) ×2
STOCKINETTE 6 STRL (DRAPES) ×1 IMPLANT
SUT BONE WAX W31G (SUTURE) ×3 IMPLANT
SUT ETHILON 2 0 FS 18 (SUTURE) ×9 IMPLANT
SUT VIC AB 2-0 CT2 18 VCP726D (SUTURE) ×6 IMPLANT
SYR BULB IRRIGATION 50ML (SYRINGE) ×3 IMPLANT
SYR CONTROL 10ML LL (SYRINGE) ×3 IMPLANT
TOWEL OR 17X24 6PK STRL BLUE (TOWEL DISPOSABLE) ×3 IMPLANT
TOWEL OR 17X26 10 PK STRL BLUE (TOWEL DISPOSABLE) ×3 IMPLANT
TRAY FOLEY W/METER SILVER 14FR (SET/KITS/TRAYS/PACK) IMPLANT
TUBE CONNECTING 12'X1/4 (SUCTIONS) ×1
TUBE CONNECTING 12X1/4 (SUCTIONS) ×2 IMPLANT
UNDERPAD 30X30 INCONTINENT (UNDERPADS AND DIAPERS) IMPLANT
WATER STERILE IRR 1000ML POUR (IV SOLUTION) ×3 IMPLANT

## 2015-02-10 NOTE — OR Nursing (Signed)
Pt ambulated to bathroom with assistant and voided. No difficulty. Pt c/o nausea but refused nausea medications and stated that sometimes nausea medications make her nausea worse. Stated that she was ready to go home. Pt husband and neighbors are with pt and will drive pt home. D/c paperwork was explained to pt and family and pt and family had no questions after receiving d/c paper work.

## 2015-02-10 NOTE — Brief Op Note (Signed)
02/10/2015  8:47 AM  PATIENT:  Cassidy Parsons  70 y.o. female  PRE-OPERATIVE DIAGNOSIS:  hardware complicating wound infection  POST-OPERATIVE DIAGNOSIS:  hardware complicating wound infection  PROCEDURE:  Procedure(s): Removal of cranial plate with scalp wound revision (N/A)  SURGEON:  Surgeon(s) and Role:    * Earnie Larsson, MD - Primary  PHYSICIAN ASSISTANT:   ASSISTANTS:    ANESTHESIA:   general  EBL:  Total I/O In: -  Out: 50 [Blood:50]  BLOOD ADMINISTERED:none  DRAINS: none   LOCAL MEDICATIONS USED:  NONE  SPECIMEN:  No Specimen  DISPOSITION OF SPECIMEN:  N/A  COUNTS:  YES  TOURNIQUET:  * No tourniquets in log *  DICTATION: .Dragon Dictation  PLAN OF CARE: Discharge to home after PACU  PATIENT DISPOSITION:  PACU - hemodynamically stable.   Delay start of Pharmacological VTE agent (>24hrs) due to surgical blood loss or risk of bleeding: yes

## 2015-02-10 NOTE — Discharge Instructions (Signed)

## 2015-02-10 NOTE — Transfer of Care (Signed)
Immediate Anesthesia Transfer of Care Note  Patient: Cassidy Parsons  Procedure(s) Performed: Procedure(s): Removal of cranial plate with scalp wound revision (N/A)  Patient Location: PACU  Anesthesia Type:General  Level of Consciousness: awake, alert  and oriented  Airway & Oxygen Therapy: Patient Spontanous Breathing and Patient connected to nasal cannula oxygen  Post-op Assessment: Report given to RN and Patient moving all extremities X 4  Post vital signs: Reviewed and stable  Last Vitals:  Filed Vitals:   02/10/15 0620  BP: 138/52  Pulse: 75  Temp: 0000000 C    Complications: No apparent anesthesia complications

## 2015-02-10 NOTE — Anesthesia Postprocedure Evaluation (Signed)
Anesthesia Post Note  Patient: Cassidy Parsons  Procedure(s) Performed: Procedure(s) (LRB): Removal of cranial plate with scalp wound revision (N/A)  Patient location during evaluation: PACU Anesthesia Type: General Level of consciousness: awake Pain management: pain level controlled Vital Signs Assessment: post-procedure vital signs reviewed and stable Respiratory status: spontaneous breathing Cardiovascular status: stable Anesthetic complications: no    Last Vitals:  Filed Vitals:   02/10/15 0955 02/10/15 1025  BP: 150/77 148/93  Pulse: 73 72  Temp:  36.4 C  Resp: 18 18    Last Pain:  Filed Vitals:   02/10/15 1102  PainSc: 3                  EDWARDS,Cebastian Neis

## 2015-02-10 NOTE — H&P (Signed)
Cassidy Parsons is an 70 y.o. female.   Chief Complaint: Exposed Hardware HPI: The patient is a 70 year old female status post previous bilateral parietal craniotomy for treatment of  meningioma approximate 16 years ago. the patient has done well following her surgery with no evidence of recurrent or residual disease. She presents now with an intermittently draining cranial wound and exposed hardware from her craniotomy plate. Plan is for reexploration of her craniotomy and removal of hardware.   Past Medical History  Diagnosis Date  . Hypertension     PMH:  . Environmental and seasonal allergies   . GERD (gastroesophageal reflux disease)   . Asthma   . On home oxygen therapy     at night only  . Diabetes mellitus without complication (HCC)     borderline  . Seizures ([place])     haven't had a seizure since brain surgery in 2001  . Brain tumor (benign) ([place])     right side  . Early cataracts, bilateral     Past Surgical History  Procedure Laterality Date  . Diagnostic laparoscopy      panniculectomy  . Tonsillectomy    . Brain surgery    . Breast surgery      biopsy right breast    Family History  Problem Relation Age of Onset  . Cancer Mother   . Heart failure Father   . Stroke Sister   . Diabetes Other    Social History:  reports that she has quit smoking. Her smoking use included Cigarettes. She has never used smokeless tobacco. She reports that she does not drink alcohol or use illicit drugs.  Allergies:  Allergies  Allergen Reactions  . Roxanol [Morphine] Other (See Comments)    Extreme pain  . Meperidine And Related Itching and Rash  . Other Itching and Rash    All pain relievers except hydocodone Trees, mold, dust, and perfume allergies (sneezing, eyes swell up)  . Propoxyphene Itching and Rash  . Tape Itching and Rash    With paper tape.  Please use adhesive tape  . Tramadol Itching and Rash    Medications Prior to Admission  Medication Sig Dispense  Refill  . aspirin EC 81 MG tablet Take 81 mg by mouth daily.    Marland Kitchen azelastine (ASTELIN) 0.1 % nasal spray Place 1 spray into both nostrils daily. Use in each nostril as directed    . Calcium Carb-Cholecalciferol (CALCIUM 600 + D) 600-200 MG-UNIT TABS Take 1 tablet by mouth daily.    . cetirizine (ZYRTEC) 10 MG tablet Take 10 mg by mouth daily.    Marland Kitchen docusate sodium (COLACE) 100 MG capsule Take 200 mg by mouth at bedtime.    Marland Kitchen esomeprazole (NEXIUM) 40 MG capsule Take 40 mg by mouth daily at 12 noon.    . fluticasone (FLONASE) 50 MCG/ACT nasal spray Place 1 spray into both nostrils at bedtime.    . Fluticasone-Salmeterol (ADVAIR) 250-50 MCG/DOSE AEPB Inhale 1 puff into the lungs 2 (two) times daily.    . montelukast (SINGULAIR) 10 MG tablet Take 10 mg by mouth at bedtime.    . Multiple Vitamins-Minerals (MULTIVITAMIN ADULT PO) Take 1 tablet by mouth daily.    Marland Kitchen oxybutynin (DITROPAN-XL) 5 MG 24 hr tablet Take 5 mg by mouth at bedtime.    . potassium citrate (UROCIT-K) 10 MEQ (1080 MG) SR tablet Take 20 mEq by mouth daily.    . fluocinonide (LIDEX) 0.05 % external solution Apply 1 application topically 2 (  two) times daily as needed (for infection).      Results for orders placed or performed during the hospital encounter of 02/10/15 (from the past 48 hour(s))  Glucose, capillary     Status: Abnormal   Collection Time: 02/10/15  6:25 AM  Result Value Ref Range   Glucose-Capillary 104 (H) 65 - 99 mg/dL   No results found.  Pertinent items noted in HPI and remainder of comprehensive ROS otherwise negative.  Blood pressure 138/52, pulse 75, temperature 98.9 F (37.2 C), temperature source Oral, height 5\' 1"  (1.549 m), weight 88.905 kg (196 lb), SpO2 99 %.  The patient is awake and alert. She is oriented and appropriate. Cranial nerve function is intact. Speech is fluent. Judgment and insight are intact. Motor and sensory function of the extremities are normal. Reflexes are normal. Examination head  ears eyes and throat demonstrates evidence of her craniotomy flap with a small area of dehiscence and exposed hardware. There is no active purulence. Examination of the oropharynx nasopharynx and external ear canals are normal. Neck is supple with full active range of motion. Chest and abdomen benign. Extremities are free from injury deformity. Assessment/Plan   Scalp wound dehiscence with exposed hardware and possible underlying osteomyelitis. Plan reexploration of scalp wound with removal of hardware and possible debridement of the craniotomy flap. Risks and benefits of been explained. Patient wishes to proceed.  TRUE Garciamartinez A 02/10/2015, 7:46 AM

## 2015-02-10 NOTE — Op Note (Signed)
Date of procedure:02/10/2015 Date of dictation: same  Service: Neurosurgery  Preoperative diagnosis: status post biparietal craniotomy with delayed wound dehiscence and exposed hardware with underlying osteomyelitis of the skull  Postoperative diagnosis: same  Procedure Name: reexploration of craniotomy with removal of hardware. Debridement of cranial bone. Primary wound reclosure.  Surgeon:Sherod Cisse A.Desmond Szabo, M.D.  Asst. Surgeon: none  Anesthesia: General  Indication:70 year old female 74 years status post craniotomy and resection of parasagittal meningioma. Patient presents with draining scalp wound and exposed hardware. Scalp retracted and thinned over craniotomy. Plan reexploration of cranial wound with removal of exposed hardware  Operative note:after induction of anesthesia, patient position supine with head turned toward the left. Patient's right parietal scalp prepped and draped sterilely. Area of skin dehiscence cleaned and wound opened excising the small area of disc dehiscence. Star-shaped burr hole cover was dissected free and removed. Underlying bone scraped with curet and found to be free from obvious deeper infection. Wound is irrigated. Scalp undermined and released to the best of my ability. The scalp was then closed primarily with 2-0 nylon interrupted vertical mattress sutures. Closure itself was difficult secondary to the scalp wound retraction but good apposition of the skin edges was achieved. No apparent complications. Patient tolerated the procedure well. She returns to the recovery room postop.

## 2015-02-10 NOTE — Anesthesia Preprocedure Evaluation (Addendum)
Anesthesia Evaluation  Patient identified by MRN, date of birth, ID band Patient awake    Reviewed: Allergy & Precautions, NPO status , Patient's Chart, lab work & pertinent test results  Airway Mallampati: I  TM Distance: >3 FB Neck ROM: Full    Dental  (+) Edentulous Upper, Partial Lower, Dental Advisory Given   Pulmonary asthma , former smoker,    breath sounds clear to auscultation       Cardiovascular hypertension,  Rhythm:Regular Rate:Normal     Neuro/Psych    GI/Hepatic Neg liver ROS, GERD  ,  Endo/Other  diabetes, Type 2  Renal/GU      Musculoskeletal   Abdominal   Peds  Hematology   Anesthesia Other Findings   Reproductive/Obstetrics                          Anesthesia Physical Anesthesia Plan  ASA: III  Anesthesia Plan: General   Post-op Pain Management:    Induction: Intravenous  Airway Management Planned: Oral ETT  Additional Equipment:   Intra-op Plan:   Post-operative Plan: Extubation in OR  Informed Consent: I have reviewed the patients History and Physical, chart, labs and discussed the procedure including the risks, benefits and alternatives for the proposed anesthesia with the patient or authorized representative who has indicated his/her understanding and acceptance.   Dental advisory given  Plan Discussed with: CRNA and Anesthesiologist  Anesthesia Plan Comments:         Anesthesia Quick Evaluation

## 2015-02-10 NOTE — Anesthesia Procedure Notes (Signed)
Procedure Name: Intubation Date/Time: 02/10/2015 8:05 AM Performed by: Sampson Si E Pre-anesthesia Checklist: Patient identified, Emergency Drugs available, Suction available, Patient being monitored and Timeout performed Patient Re-evaluated:Patient Re-evaluated prior to inductionOxygen Delivery Method: Circle system utilized Preoxygenation: Pre-oxygenation with 100% oxygen Intubation Type: IV induction Ventilation: Mask ventilation without difficulty Laryngoscope Size: Mac and 3 Grade View: Grade I Tube type: Oral Tube size: 7.0 mm Number of attempts: 1 Airway Equipment and Method: Stylet Placement Confirmation: ETT inserted through vocal cords under direct vision,  positive ETCO2 and breath sounds checked- equal and bilateral Secured at: 21 cm Tube secured with: Tape Dental Injury: Teeth and Oropharynx as per pre-operative assessment

## 2015-02-11 ENCOUNTER — Encounter (HOSPITAL_COMMUNITY): Payer: Self-pay | Admitting: Neurosurgery

## 2015-02-11 LAB — HEMOGLOBIN A1C
HEMOGLOBIN A1C: 5.8 % — AB (ref 4.8–5.6)
Mean Plasma Glucose: 120 mg/dL

## 2015-02-17 DIAGNOSIS — G479 Sleep disorder, unspecified: Secondary | ICD-10-CM | POA: Diagnosis not present

## 2015-02-17 DIAGNOSIS — J4530 Mild persistent asthma, uncomplicated: Secondary | ICD-10-CM | POA: Diagnosis not present

## 2015-02-17 DIAGNOSIS — J301 Allergic rhinitis due to pollen: Secondary | ICD-10-CM | POA: Diagnosis not present

## 2015-03-31 DIAGNOSIS — L918 Other hypertrophic disorders of the skin: Secondary | ICD-10-CM | POA: Diagnosis not present

## 2015-03-31 DIAGNOSIS — L720 Epidermal cyst: Secondary | ICD-10-CM | POA: Diagnosis not present

## 2015-03-31 DIAGNOSIS — L820 Inflamed seborrheic keratosis: Secondary | ICD-10-CM | POA: Diagnosis not present

## 2015-03-31 DIAGNOSIS — L821 Other seborrheic keratosis: Secondary | ICD-10-CM | POA: Diagnosis not present

## 2015-04-07 DIAGNOSIS — G4700 Insomnia, unspecified: Secondary | ICD-10-CM | POA: Diagnosis not present

## 2015-04-07 DIAGNOSIS — E119 Type 2 diabetes mellitus without complications: Secondary | ICD-10-CM | POA: Diagnosis not present

## 2015-04-07 DIAGNOSIS — I10 Essential (primary) hypertension: Secondary | ICD-10-CM | POA: Diagnosis not present

## 2015-04-07 DIAGNOSIS — J301 Allergic rhinitis due to pollen: Secondary | ICD-10-CM | POA: Diagnosis not present

## 2015-08-01 DIAGNOSIS — S29012A Strain of muscle and tendon of back wall of thorax, initial encounter: Secondary | ICD-10-CM | POA: Diagnosis not present

## 2015-08-09 DIAGNOSIS — E119 Type 2 diabetes mellitus without complications: Secondary | ICD-10-CM | POA: Diagnosis not present

## 2015-08-09 DIAGNOSIS — Z0001 Encounter for general adult medical examination with abnormal findings: Secondary | ICD-10-CM | POA: Diagnosis not present

## 2015-08-18 DIAGNOSIS — J4530 Mild persistent asthma, uncomplicated: Secondary | ICD-10-CM | POA: Diagnosis not present

## 2015-08-18 DIAGNOSIS — G4700 Insomnia, unspecified: Secondary | ICD-10-CM | POA: Diagnosis not present

## 2015-08-18 DIAGNOSIS — J301 Allergic rhinitis due to pollen: Secondary | ICD-10-CM | POA: Diagnosis not present

## 2015-09-06 ENCOUNTER — Other Ambulatory Visit: Payer: Self-pay

## 2015-10-18 DIAGNOSIS — Z23 Encounter for immunization: Secondary | ICD-10-CM | POA: Diagnosis not present

## 2015-11-03 DIAGNOSIS — L400 Psoriasis vulgaris: Secondary | ICD-10-CM | POA: Diagnosis not present

## 2016-01-31 DIAGNOSIS — Z0001 Encounter for general adult medical examination with abnormal findings: Secondary | ICD-10-CM | POA: Diagnosis not present

## 2016-01-31 DIAGNOSIS — E782 Mixed hyperlipidemia: Secondary | ICD-10-CM | POA: Diagnosis not present

## 2016-01-31 DIAGNOSIS — E119 Type 2 diabetes mellitus without complications: Secondary | ICD-10-CM | POA: Diagnosis not present

## 2016-02-07 DIAGNOSIS — M720 Palmar fascial fibromatosis [Dupuytren]: Secondary | ICD-10-CM | POA: Diagnosis not present

## 2016-02-07 DIAGNOSIS — K219 Gastro-esophageal reflux disease without esophagitis: Secondary | ICD-10-CM | POA: Diagnosis not present

## 2016-02-07 DIAGNOSIS — J309 Allergic rhinitis, unspecified: Secondary | ICD-10-CM | POA: Diagnosis not present

## 2016-02-07 DIAGNOSIS — J301 Allergic rhinitis due to pollen: Secondary | ICD-10-CM | POA: Diagnosis not present

## 2016-02-13 DIAGNOSIS — Z1231 Encounter for screening mammogram for malignant neoplasm of breast: Secondary | ICD-10-CM | POA: Diagnosis not present

## 2016-08-10 DIAGNOSIS — Z0001 Encounter for general adult medical examination with abnormal findings: Secondary | ICD-10-CM | POA: Diagnosis not present

## 2016-08-10 DIAGNOSIS — E6609 Other obesity due to excess calories: Secondary | ICD-10-CM | POA: Diagnosis not present

## 2016-08-10 DIAGNOSIS — I10 Essential (primary) hypertension: Secondary | ICD-10-CM | POA: Diagnosis not present

## 2016-08-10 DIAGNOSIS — K219 Gastro-esophageal reflux disease without esophagitis: Secondary | ICD-10-CM | POA: Diagnosis not present

## 2016-08-10 DIAGNOSIS — J309 Allergic rhinitis, unspecified: Secondary | ICD-10-CM | POA: Diagnosis not present

## 2016-08-10 DIAGNOSIS — N390 Urinary tract infection, site not specified: Secondary | ICD-10-CM | POA: Diagnosis not present

## 2016-08-10 DIAGNOSIS — E119 Type 2 diabetes mellitus without complications: Secondary | ICD-10-CM | POA: Diagnosis not present

## 2016-08-16 DIAGNOSIS — J4530 Mild persistent asthma, uncomplicated: Secondary | ICD-10-CM | POA: Diagnosis not present

## 2016-08-16 DIAGNOSIS — G4700 Insomnia, unspecified: Secondary | ICD-10-CM | POA: Diagnosis not present

## 2016-08-16 DIAGNOSIS — J309 Allergic rhinitis, unspecified: Secondary | ICD-10-CM | POA: Diagnosis not present

## 2016-10-05 DIAGNOSIS — M545 Low back pain: Secondary | ICD-10-CM | POA: Diagnosis not present

## 2016-10-05 DIAGNOSIS — Z23 Encounter for immunization: Secondary | ICD-10-CM | POA: Diagnosis not present

## 2016-10-05 DIAGNOSIS — M720 Palmar fascial fibromatosis [Dupuytren]: Secondary | ICD-10-CM | POA: Insufficient documentation

## 2016-10-05 DIAGNOSIS — J4530 Mild persistent asthma, uncomplicated: Secondary | ICD-10-CM | POA: Diagnosis not present

## 2017-01-12 ENCOUNTER — Other Ambulatory Visit: Payer: Self-pay | Admitting: Internal Medicine

## 2017-01-26 ENCOUNTER — Other Ambulatory Visit: Payer: Self-pay | Admitting: Internal Medicine

## 2017-02-05 ENCOUNTER — Encounter: Payer: Self-pay | Admitting: Internal Medicine

## 2017-02-05 ENCOUNTER — Ambulatory Visit: Payer: Medicare Other | Admitting: Internal Medicine

## 2017-02-05 VITALS — BP 140/85 | HR 81 | Resp 16 | Ht 61.0 in | Wt 229.6 lb

## 2017-02-05 DIAGNOSIS — G4730 Sleep apnea, unspecified: Secondary | ICD-10-CM | POA: Insufficient documentation

## 2017-02-05 DIAGNOSIS — Z1231 Encounter for screening mammogram for malignant neoplasm of breast: Secondary | ICD-10-CM

## 2017-02-05 DIAGNOSIS — E119 Type 2 diabetes mellitus without complications: Secondary | ICD-10-CM

## 2017-02-05 DIAGNOSIS — J4530 Mild persistent asthma, uncomplicated: Secondary | ICD-10-CM | POA: Diagnosis not present

## 2017-02-05 DIAGNOSIS — R030 Elevated blood-pressure reading, without diagnosis of hypertension: Secondary | ICD-10-CM | POA: Insufficient documentation

## 2017-02-05 DIAGNOSIS — I6523 Occlusion and stenosis of bilateral carotid arteries: Secondary | ICD-10-CM | POA: Insufficient documentation

## 2017-02-05 DIAGNOSIS — J309 Allergic rhinitis, unspecified: Secondary | ICD-10-CM | POA: Diagnosis not present

## 2017-02-05 DIAGNOSIS — G4700 Insomnia, unspecified: Secondary | ICD-10-CM

## 2017-02-05 DIAGNOSIS — M545 Low back pain, unspecified: Secondary | ICD-10-CM | POA: Insufficient documentation

## 2017-02-05 DIAGNOSIS — M109 Gout, unspecified: Secondary | ICD-10-CM | POA: Diagnosis not present

## 2017-02-05 DIAGNOSIS — F411 Generalized anxiety disorder: Secondary | ICD-10-CM | POA: Insufficient documentation

## 2017-02-05 DIAGNOSIS — T849XXA Unspecified complication of internal orthopedic prosthetic device, implant and graft, initial encounter: Secondary | ICD-10-CM | POA: Insufficient documentation

## 2017-02-05 MED ORDER — FLUTICASONE PROPIONATE 50 MCG/ACT NA SUSP: 1.0000 | Freq: Every day | NASAL | 6 refills | Status: DC

## 2017-02-05 NOTE — Progress Notes (Signed)
[facility] [place], [place] [address]  Internal MEDICINE  Office Visit Note  Patient Name: Cassidy Parsons  [id number]  [id number]  Date of Service: 02/05/2017    HPI  Pt is here for routine follow up. She has been doing well, However concerned about her husband's worsening health. She is unable to go to the gym due to that. Has gained some weight.  Current Medication: Outpatient Encounter Medications as of 02/05/2017  Medication Sig  . aspirin EC 81 MG tablet Take 81 mg by mouth daily.  Marland Kitchen azelastine (ASTELIN) 0.1 % nasal spray Place 1 spray into both nostrils daily. Use in each nostril as directed  . Calcium Carb-Cholecalciferol (CALCIUM 600 + D) 600-200 MG-UNIT TABS Take 1 tablet by mouth daily.  . cetirizine (ZYRTEC) 10 MG tablet Take 10 mg by mouth daily.  Marland Kitchen docusate sodium (COLACE) 100 MG capsule Take 200 mg by mouth at bedtime.  Marland Kitchen doxycycline (VIBRAMYCIN) 50 MG capsule Take 1 capsule (50 mg total) by mouth 2 (two) times daily.  Marland Kitchen esomeprazole (NEXIUM) 40 MG capsule Take 40 mg by mouth daily at 12 noon.  . fluocinonide (LIDEX) 0.05 % external solution Apply 1 application topically 2 (two) times daily as needed (for infection).  . fluticasone (FLONASE) 50 MCG/ACT nasal spray Place 1 spray into both nostrils at bedtime.  . Fluticasone-Salmeterol (ADVAIR) 250-50 MCG/DOSE AEPB Inhale 1 puff into the lungs 2 (two) times daily.  Marland Kitchen HYDROcodone-acetaminophen (NORCO) 5-325 MG tablet Take 1 tablet by mouth every 6 (six) hours as needed for moderate pain.  . montelukast (SINGULAIR) 10 MG tablet Take 10 mg by mouth at bedtime.  . Multiple Vitamins-Minerals (MULTIVITAMIN ADULT PO) Take 1 tablet by mouth daily.  Marland Kitchen oxybutynin (DITROPAN) 5 MG tablet TAKE ONE (1) TABLET EACH DAY FOR OVERACTIVE BLADDER  . oxybutynin (DITROPAN-XL) 5 MG 24 hr tablet Take 5 mg by mouth at bedtime.  . potassium citrate (UROCIT-K) 10 MEQ (1080 MG) SR tablet TAKE ONE TABLET TWICE DAILY    No facility-administered encounter medications on file as of 02/05/2017.     Surgical History: Past Surgical History:  Procedure Laterality Date  . BRAIN SURGERY    . BREAST SURGERY     biopsy right breast  . CRANIOTOMY N/A 02/10/2015   Procedure: Removal of cranial plate with scalp wound revision;  Surgeon: Earnie Larsson, MD;  Location: [facility];  Service: Neurosurgery;  Laterality: N/A;  . DIAGNOSTIC LAPAROSCOPY     panniculectomy  . TONSILLECTOMY      Medical History: Past Medical History:  Diagnosis Date  . Asthma   . Brain tumor (benign) ([place])    right side  . Diabetes mellitus without complication (HCC)    borderline  . Early cataracts, bilateral   . Environmental and seasonal allergies   . GERD (gastroesophageal reflux disease)   . Hypertension    PMH:  . On home oxygen therapy    at night only  . Seizures ([place])    haven't had a seizure since brain surgery in 2001    Family History: Family History  Problem Relation Age of Onset  . Cancer Mother   . Heart failure Father   . Stroke Sister   . Diabetes Other     Social History   Socioeconomic History  . Marital status: Married    Spouse name: Not on file  . Number of children: Not on file  . Years of education: Not on file  .  Highest education level: Not on file  Social Needs  . Financial resource strain: Not on file  . Food insecurity - worry: Not on file  . Food insecurity - inability: Not on file  . Transportation needs - medical: Not on file  . Transportation needs - non-medical: Not on file  Occupational History  . Not on file  Tobacco Use  . Smoking status: Former Smoker    Types: Cigarettes  . Smokeless tobacco: Never Used  . Tobacco comment: quit smoking cigarettes 34 years ago  Substance and Sexual Activity  . Alcohol use: No  . Drug use: No  . Sexual activity: Not on file  Other Topics Concern  . Not on file  Social History Narrative  . Not on file    Review of Systems   Constitutional: Negative for chills, fatigue and unexpected weight change.  HENT: Positive for postnasal drip. Negative for congestion, rhinorrhea, sneezing and sore throat.   Eyes: Negative for redness.  Respiratory: Negative for cough, chest tightness and shortness of breath.   Cardiovascular: Negative for chest pain and palpitations.  Gastrointestinal: Negative for abdominal pain, constipation, diarrhea, nausea and vomiting.  Genitourinary: Negative for dysuria and frequency.  Musculoskeletal: Negative for arthralgias, back pain, joint swelling and neck pain.  Skin: Negative for rash.  Neurological: Negative.  Negative for tremors and numbness.  Hematological: Negative for adenopathy. Does not bruise/bleed easily.  Psychiatric/Behavioral: Negative for behavioral problems (Depression), sleep disturbance and suicidal ideas. The patient is not nervous/anxious.     Vital Signs: BP 140/85 (BP Location: Left Arm, Patient Position: Sitting)   Pulse 81   Resp 16   Ht 5\' 1"  (1.549 m)   Wt 229 lb 9.6 oz (104.1 kg)   SpO2 96%   BMI 43.38 kg/m    Physical Exam  Constitutional: She is oriented to person, place, and time. She appears well-developed and well-nourished. No distress.  HENT:  Head: Normocephalic and atraumatic.  Mouth/Throat: Oropharynx is clear and moist. No oropharyngeal exudate.  Eyes: EOM are normal. Pupils are equal, round, and reactive to light.  Neck: Normal range of motion. Neck supple. No thyromegaly present.  Cardiovascular: Normal rate, regular rhythm and normal heart sounds.  Pulmonary/Chest: Effort normal. No respiratory distress. She has no wheezes. She has no rales. She exhibits no tenderness.  Abdominal: Soft. Bowel sounds are normal.  Musculoskeletal: Normal range of motion.  Neurological: She is alert and oriented to person, place, and time. No cranial nerve deficit.  Skin: Skin is warm and dry. She is not diaphoretic.  Psychiatric: She has a normal mood  and affect.     Assessment/Plan: 1. Mild persistent asthma, uncomplicated -Stable  2. Allergic rhinitis, unspecified seasonality, unspecified trigger -Stable  3. Diabetes mellitus without complication ([place]) - POCT HgB A1C, results reviewed with her   4. Gout, unspecified cause, unspecified chronicity, unspecified site - Controlled   5. Insomnia, unspecified type - Controlled   6. Breast cancer screening by mammogram  - MM DIGITAL SCREENING BILATERAL; Future  General Counseling: joaquina nissen understanding of the findings of todays visit and agrees with plan of treatment. I have discussed any further diagnostic evaluation that may be needed or ordered today. We also reviewed her medications today. she has been encouraged to call the office with any questions or concerns that should arise related to todays visit.    Orders Placed This Encounter  Procedures  . POCT HgB A1C     Time spent: 30 Minutes  Dr Lavera Guise Internal medicine

## 2017-02-07 ENCOUNTER — Ambulatory Visit: Payer: Self-pay | Admitting: Nurse Practitioner

## 2017-02-11 LAB — POCT GLYCOSYLATED HEMOGLOBIN (HGB A1C): HEMOGLOBIN A1C: 5.9

## 2017-03-01 DIAGNOSIS — M1711 Unilateral primary osteoarthritis, right knee: Secondary | ICD-10-CM | POA: Diagnosis not present

## 2017-03-01 DIAGNOSIS — Z1231 Encounter for screening mammogram for malignant neoplasm of breast: Secondary | ICD-10-CM | POA: Diagnosis not present

## 2017-03-01 DIAGNOSIS — M1712 Unilateral primary osteoarthritis, left knee: Secondary | ICD-10-CM | POA: Diagnosis not present

## 2017-03-14 DIAGNOSIS — L400 Psoriasis vulgaris: Secondary | ICD-10-CM | POA: Diagnosis not present

## 2017-03-14 DIAGNOSIS — L219 Seborrheic dermatitis, unspecified: Secondary | ICD-10-CM | POA: Diagnosis not present

## 2017-03-26 ENCOUNTER — Other Ambulatory Visit: Payer: Self-pay

## 2017-03-26 MED ORDER — MONTELUKAST SODIUM 10 MG PO TABS: 10.0000 mg | ORAL_TABLET | Freq: Every day | ORAL | 2 refills | Status: DC

## 2017-04-04 DIAGNOSIS — M170 Bilateral primary osteoarthritis of knee: Secondary | ICD-10-CM | POA: Diagnosis not present

## 2017-04-11 DIAGNOSIS — M170 Bilateral primary osteoarthritis of knee: Secondary | ICD-10-CM | POA: Diagnosis not present

## 2017-04-11 DIAGNOSIS — M179 Osteoarthritis of knee, unspecified: Secondary | ICD-10-CM | POA: Insufficient documentation

## 2017-04-18 DIAGNOSIS — M170 Bilateral primary osteoarthritis of knee: Secondary | ICD-10-CM | POA: Diagnosis not present

## 2017-04-25 DIAGNOSIS — M170 Bilateral primary osteoarthritis of knee: Secondary | ICD-10-CM | POA: Diagnosis not present

## 2017-05-16 DIAGNOSIS — L408 Other psoriasis: Secondary | ICD-10-CM | POA: Diagnosis not present

## 2017-05-16 DIAGNOSIS — L400 Psoriasis vulgaris: Secondary | ICD-10-CM | POA: Diagnosis not present

## 2017-05-16 DIAGNOSIS — L219 Seborrheic dermatitis, unspecified: Secondary | ICD-10-CM | POA: Diagnosis not present

## 2017-05-22 ENCOUNTER — Other Ambulatory Visit: Payer: Self-pay | Admitting: Internal Medicine

## 2017-05-22 MED ORDER — AZELASTINE HCL 0.1 % NA SOLN: 1.0000 | Freq: Every day | NASAL | 1 refills | Status: DC

## 2017-08-05 ENCOUNTER — Other Ambulatory Visit: Payer: Self-pay

## 2017-08-05 ENCOUNTER — Telehealth: Payer: Self-pay | Admitting: Internal Medicine

## 2017-08-05 MED ORDER — POTASSIUM CITRATE ER 10 MEQ (1080 MG) PO TBCR: 10.0000 meq | EXTENDED_RELEASE_TABLET | Freq: Two times a day (BID) | ORAL | 0 refills | Status: DC

## 2017-08-05 NOTE — Telephone Encounter (Signed)
FAXED SIGNED CMN TO [facility] AND PUT IN THEIR [email or web address]

## 2017-08-12 ENCOUNTER — Ambulatory Visit: Payer: Self-pay | Admitting: Internal Medicine

## 2017-08-13 ENCOUNTER — Encounter: Payer: Self-pay | Admitting: Internal Medicine

## 2017-08-22 ENCOUNTER — Ambulatory Visit (INDEPENDENT_AMBULATORY_CARE_PROVIDER_SITE_OTHER): Payer: Medicare Other | Admitting: Internal Medicine

## 2017-08-22 ENCOUNTER — Encounter: Payer: Self-pay | Admitting: Internal Medicine

## 2017-08-22 VITALS — BP 128/72 | HR 74 | Resp 16 | Ht 60.0 in | Wt 249.0 lb

## 2017-08-22 DIAGNOSIS — R0602 Shortness of breath: Secondary | ICD-10-CM

## 2017-08-22 DIAGNOSIS — J4520 Mild intermittent asthma, uncomplicated: Secondary | ICD-10-CM

## 2017-08-22 DIAGNOSIS — G4700 Insomnia, unspecified: Secondary | ICD-10-CM

## 2017-08-22 NOTE — Progress Notes (Signed)
[facility] [place], [place] [address]  Pulmonary Sleep Medicine   Office Visit Note  Patient Name: Cassidy Parsons DOB: 1945-03-23 MRN [id number]  Date of Service: 08/22/2017  Complaints/HPI: she states that she is doing well overall. She has not had any flareups of her asthma. Busy with taking care of her husband. As far as sleep she is having some bad nights with not being able to get to sleep. Still has not been able to lose any more weight. She has been busy with her husband. Arlyce Harman looks good today. She has not been using her oxygen at night so as to keep an eye on her husband  ROS  General: (-) fever, (-) chills, (-) night sweats, (-) weakness Skin: (-) rashes, (-) itching,. Eyes: (-) visual changes, (-) redness, (-) itching. Nose and Sinuses: (-) nasal stuffiness or itchiness, (-) postnasal drip, (-) nosebleeds, (-) sinus trouble. Mouth and Throat: (-) sore throat, (-) hoarseness. Neck: (-) swollen glands, (-) enlarged thyroid, (-) neck pain. Respiratory: - cough, (-) bloody sputum, - shortness of breath, - wheezing. Cardiovascular: - ankle swelling, (-) chest pain. Lymphatic: (-) lymph node enlargement. Neurologic: (-) numbness, (-) tingling. Psychiatric: (-) anxiety, (-) depression   Current Medication: Outpatient Encounter Medications as of 08/22/2017  Medication Sig  . aspirin EC 81 MG tablet Take 81 mg by mouth daily.  Marland Kitchen azelastine (ASTELIN) 0.1 % nasal spray Place 1 spray into both nostrils daily. Use in each nostril as directed  . Calcium Carb-Cholecalciferol (CALCIUM 600 + D) 600-200 MG-UNIT TABS Take 1 tablet by mouth daily.  . cetirizine (ZYRTEC) 10 MG tablet Take 10 mg by mouth daily.  Marland Kitchen docusate sodium (COLACE) 100 MG capsule Take 200 mg by mouth at bedtime.  Marland Kitchen doxycycline (VIBRAMYCIN) 50 MG capsule Take 1 capsule (50 mg total) by mouth 2 (two) times daily.  Marland Kitchen esomeprazole (NEXIUM) 40 MG capsule Take 40 mg by mouth daily at 12 noon.   . fluocinonide (LIDEX) 0.05 % external solution Apply 1 application topically 2 (two) times daily as needed (for infection).  . fluticasone (FLONASE) 50 MCG/ACT nasal spray Place 1 spray into both nostrils at bedtime.  . Fluticasone-Salmeterol (ADVAIR) 250-50 MCG/DOSE AEPB Inhale 1 puff into the lungs 2 (two) times daily.  Marland Kitchen HYDROcodone-acetaminophen (NORCO) 5-325 MG tablet Take 1 tablet by mouth every 6 (six) hours as needed for moderate pain.  . montelukast (SINGULAIR) 10 MG tablet Take 1 tablet (10 mg total) by mouth at bedtime.  . Multiple Vitamins-Minerals (MULTIVITAMIN ADULT PO) Take 1 tablet by mouth daily.  Marland Kitchen oxybutynin (DITROPAN) 5 MG tablet TAKE ONE (1) TABLET EACH DAY FOR OVERACTIVE BLADDER  . oxybutynin (DITROPAN-XL) 5 MG 24 hr tablet Take 5 mg by mouth at bedtime.  . potassium citrate (UROCIT-K) 10 MEQ (1080 MG) SR tablet Take 1 tablet (10 mEq total) by mouth 2 (two) times daily.   No facility-administered encounter medications on file as of 08/22/2017.     Surgical History: Past Surgical History:  Procedure Laterality Date  . BRAIN SURGERY    . BREAST SURGERY     biopsy right breast  . CRANIOTOMY N/A 02/10/2015   Procedure: Removal of cranial plate with scalp wound revision;  Surgeon: Earnie Larsson, MD;  Location: [facility];  Service: Neurosurgery;  Laterality: N/A;  . DIAGNOSTIC LAPAROSCOPY     panniculectomy  . TONSILLECTOMY      Medical History: Past Medical History:  Diagnosis Date  . Asthma   .  Brain tumor (benign) ([place])    right side  . Diabetes mellitus without complication (HCC)    borderline  . Early cataracts, bilateral   . Environmental and seasonal allergies   . GERD (gastroesophageal reflux disease)   . Hypertension    PMH:  . On home oxygen therapy    at night only  . Seizures ([place])    haven't had a seizure since brain surgery in 2001    Family History: Family History  Problem Relation Age of Onset  . Cancer Mother   . Heart failure Father    . Stroke Sister   . Diabetes Other     Social History: Social History   Socioeconomic History  . Marital status: Married    Spouse name: Not on file  . Number of children: Not on file  . Years of education: Not on file  . Highest education level: Not on file  Occupational History  . Not on file  Social Needs  . Financial resource strain: Not on file  . Food insecurity:    Worry: Not on file    Inability: Not on file  . Transportation needs:    Medical: Not on file    Non-medical: Not on file  Tobacco Use  . Smoking status: Former Smoker    Types: Cigarettes  . Smokeless tobacco: Never Used  . Tobacco comment: quit smoking cigarettes 34 years ago  Substance and Sexual Activity  . Alcohol use: No  . Drug use: No  . Sexual activity: Not on file  Lifestyle  . Physical activity:    Days per week: Not on file    Minutes per session: Not on file  . Stress: Not on file  Relationships  . Social connections:    Talks on phone: Not on file    Gets together: Not on file    Attends religious service: Not on file    Active member of club or organization: Not on file    Attends meetings of clubs or organizations: Not on file    Relationship status: Not on file  . Intimate partner violence:    Fear of current or ex partner: Not on file    Emotionally abused: Not on file    Physically abused: Not on file    Forced sexual activity: Not on file  Other Topics Concern  . Not on file  Social History Narrative  . Not on file    Vital Signs: Blood pressure 128/72, pulse 74, resp. rate 16, height 5' (1.524 m), weight 249 lb (112.9 kg), SpO2 97 %.  Examination: General Appearance: The patient is well-developed, well-nourished, and in no distress. Skin: Gross inspection of skin unremarkable. Head: normocephalic, no gross deformities. Eyes: no gross deformities noted. ENT: ears appear grossly normal no exudates. Neck: Supple. No thyromegaly. No LAD. Respiratory: no rhonchi  noted. Cardiovascular: Normal S1 and S2 without murmur or rub. Extremities: No cyanosis. pulses are equal. Neurologic: Alert and oriented. No involuntary movements.  LABS: No results found for this or any previous visit (from the past 2160 hour(s)).  Radiology: No results found.  No results found.  No results found.    Assessment and Plan: Patient Active Problem List   Diagnosis Date Noted  . Low back pain 02/05/2017  . Mild persistent asthma, uncomplicated 40/98/1191  . Rhinitis, allergic 02/05/2017  . GAD (generalized anxiety disorder) 02/05/2017  . Unspecified complication of internal orthopedic prosthetic device, implant and graft, initial encounter ([place]) 02/05/2017  .  Diabetes mellitus without complication ([place]) 31/59/4585  . Gout, unspecified 02/05/2017  . Occlusion and stenosis of bilateral carotid arteries 02/05/2017  . Insomnia, unspecified 02/05/2017  . Sleep apnea 02/05/2017  . Elevated blood-pressure reading without diagnosis of hypertension 02/05/2017  . Dupuytren's disease of palm 10/05/2016  . Exposed orthopaedic hardware ([place]) 02/10/2015    1. Asthma well controlled spiro looks good. Doing well with current regimen 2. Insomnia related to her current situation with her husband is ill. She has to keep an ey on him so her sleep is disturbed 3. Morbid obesity she has not been able to lose weight  General Counseling: I have discussed the findings of the evaluation and examination with Horris Latino.  I have also discussed any further diagnostic evaluation thatmay be needed or ordered today. Kassaundra verbalizes understanding of the findings of todays visit. We also reviewed her medications today and discussed drug interactions and side effects including but not limited excessive drowsiness and altered mental states. We also discussed that there is always a risk not just to her but also people around her. she has been encouraged to call the office with any questions or concerns  that should arise related to todays visit.    Time spent: 76min  I have personally obtained a history, examined the patient, evaluated laboratory and imaging results, formulated the assessment and plan and placed orders.    Allyne Gee, MD [facility] Pulmonary and Critical Care Sleep medicine

## 2017-08-22 NOTE — Patient Instructions (Signed)

## 2017-08-27 ENCOUNTER — Ambulatory Visit (INDEPENDENT_AMBULATORY_CARE_PROVIDER_SITE_OTHER): Payer: Medicare Other | Admitting: Internal Medicine

## 2017-08-27 ENCOUNTER — Encounter: Payer: Self-pay | Admitting: Internal Medicine

## 2017-08-27 DIAGNOSIS — R0602 Shortness of breath: Secondary | ICD-10-CM

## 2017-08-27 DIAGNOSIS — E288 Other ovarian dysfunction: Secondary | ICD-10-CM | POA: Diagnosis not present

## 2017-08-27 DIAGNOSIS — M8580 Other specified disorders of bone density and structure, unspecified site: Secondary | ICD-10-CM | POA: Diagnosis not present

## 2017-08-27 DIAGNOSIS — Z1239 Encounter for other screening for malignant neoplasm of breast: Secondary | ICD-10-CM

## 2017-08-27 DIAGNOSIS — E039 Hypothyroidism, unspecified: Secondary | ICD-10-CM

## 2017-08-27 DIAGNOSIS — Z1231 Encounter for screening mammogram for malignant neoplasm of breast: Secondary | ICD-10-CM | POA: Diagnosis not present

## 2017-08-27 DIAGNOSIS — E1165 Type 2 diabetes mellitus with hyperglycemia: Secondary | ICD-10-CM | POA: Diagnosis not present

## 2017-08-27 DIAGNOSIS — J4530 Mild persistent asthma, uncomplicated: Secondary | ICD-10-CM

## 2017-08-27 DIAGNOSIS — E7849 Other hyperlipidemia: Secondary | ICD-10-CM

## 2017-08-27 DIAGNOSIS — Z0001 Encounter for general adult medical examination with abnormal findings: Secondary | ICD-10-CM

## 2017-08-27 LAB — POCT GLYCOSYLATED HEMOGLOBIN (HGB A1C): Hemoglobin A1C: 6.2 % — AB (ref 4.0–5.6)

## 2017-08-27 NOTE — Progress Notes (Signed)
[facility] [place], [place] [address]  Internal MEDICINE  Office Visit Note  Patient Name: Cassidy Parsons  [id number]  [id number]  Date of Service: 09/03/2017  Chief Complaint  Patient presents with  . Diabetes  . Asthma  . Quality Metric Gaps    bone density, eye exam     HPI Pt is here for routine health maintenance examination. She is under stress due to her husband's stickness. He falls a lot. She is unable to leave him alone, she is not going to the gym. Feels depressed at times  Current Medication: Outpatient Encounter Medications as of 08/27/2017  Medication Sig  . aspirin EC 81 MG tablet Take 81 mg by mouth daily.  Marland Kitchen azelastine (ASTELIN) 0.1 % nasal spray Place 1 spray into both nostrils daily. Use in each nostril as directed  . Calcium Carb-Cholecalciferol (CALCIUM 600 + D) 600-200 MG-UNIT TABS Take 1 tablet by mouth daily.  . cetirizine (ZYRTEC) 10 MG tablet Take 10 mg by mouth daily.  Marland Kitchen docusate sodium (COLACE) 100 MG capsule Take 200 mg by mouth at bedtime.  Marland Kitchen doxycycline (VIBRAMYCIN) 50 MG capsule Take 1 capsule (50 mg total) by mouth 2 (two) times daily.  Marland Kitchen esomeprazole (NEXIUM) 40 MG capsule Take 40 mg by mouth daily at 12 noon.  . fluocinonide (LIDEX) 0.05 % external solution Apply 1 application topically 2 (two) times daily as needed (for infection).  . fluticasone (FLONASE) 50 MCG/ACT nasal spray Place 1 spray into both nostrils at bedtime.  . Fluticasone-Salmeterol (ADVAIR) 250-50 MCG/DOSE AEPB Inhale 1 puff into the lungs 2 (two) times daily.  Marland Kitchen HYDROcodone-acetaminophen (NORCO) 5-325 MG tablet Take 1 tablet by mouth every 6 (six) hours as needed for moderate pain.  . montelukast (SINGULAIR) 10 MG tablet Take 1 tablet (10 mg total) by mouth at bedtime.  . Multiple Vitamins-Minerals (MULTIVITAMIN ADULT PO) Take 1 tablet by mouth daily.  Marland Kitchen oxybutynin (DITROPAN-XL) 5 MG 24 hr tablet Take 5 mg by mouth at bedtime.  . potassium  citrate (UROCIT-K) 10 MEQ (1080 MG) SR tablet Take 1 tablet (10 mEq total) by mouth 2 (two) times daily.  . [DISCONTINUED] oxybutynin (DITROPAN) 5 MG tablet TAKE ONE (1) TABLET EACH DAY FOR OVERACTIVE BLADDER   No facility-administered encounter medications on file as of 08/27/2017.     Surgical History: Past Surgical History:  Procedure Laterality Date  . BRAIN SURGERY    . BREAST SURGERY     biopsy right breast  . CRANIOTOMY N/A 02/10/2015   Procedure: Removal of cranial plate with scalp wound revision;  Surgeon: Cassidy Larsson, MD;  Location: [facility];  Service: Neurosurgery;  Laterality: N/A;  . DIAGNOSTIC LAPAROSCOPY     panniculectomy  . TONSILLECTOMY      Medical History: Past Medical History:  Diagnosis Date  . Asthma   . Brain tumor (benign) ([place])    right side  . Diabetes mellitus without complication (HCC)    borderline  . Early cataracts, bilateral   . Environmental and seasonal allergies   . GERD (gastroesophageal reflux disease)   . Hypertension    PMH:  . On home oxygen therapy    at night only  . Seizures ([place])    haven't had a seizure since brain surgery in 2001    Family History: Family History  Problem Relation Age of Onset  . Cancer Mother   . Heart failure Father   . Stroke Sister   . Diabetes Other  Review of Systems  Constitutional: Negative for chills, diaphoresis and fatigue.  HENT: Negative for ear pain, postnasal drip and sinus pressure.   Eyes: Negative for photophobia, discharge, redness, itching and visual disturbance.  Respiratory: Negative for cough, shortness of breath and wheezing.   Cardiovascular: Negative for chest pain, palpitations and leg swelling.  Gastrointestinal: Negative for abdominal pain, constipation, diarrhea, nausea and vomiting.  Genitourinary: Negative for dysuria and flank pain.  Musculoskeletal: Negative for arthralgias, back pain, gait problem and neck pain.  Skin: Negative for color change.   Allergic/Immunologic: Negative for environmental allergies and food allergies.  Neurological: Negative for dizziness and headaches.  Hematological: Does not bruise/bleed easily.  Psychiatric/Behavioral: Negative for agitation, behavioral problems (depression) and hallucinations.    Vital Signs: BP 120/82   Pulse 75   Resp 16   Ht 5\' 1"  (1.549 m)   Wt 248 lb (112.5 kg)   SpO2 95%   BMI 46.86 kg/m   Physical Exam  Constitutional: She is oriented to person, place, and time. She appears well-developed and well-nourished. No distress.  HENT:  Head: Normocephalic and atraumatic.  Mouth/Throat: Oropharynx is clear and moist. No oropharyngeal exudate.  Eyes: Pupils are equal, round, and reactive to light. EOM are normal.  Neck: Normal range of motion. Neck supple. No JVD present. No tracheal deviation present. No thyromegaly present.  Cardiovascular: Normal rate, regular rhythm and normal heart sounds. Exam reveals no gallop and no friction rub.  No murmur heard. Pulmonary/Chest: Effort normal. No respiratory distress. She has no wheezes. She has no rales. She exhibits no tenderness.  Abdominal: Soft. Bowel sounds are normal.  Musculoskeletal: Normal range of motion.  Lymphadenopathy:    She has no cervical adenopathy.  Neurological: She is alert and oriented to person, place, and time. No cranial nerve deficit.  Skin: Skin is warm and dry. She is not diaphoretic.  Psychiatric: She has a normal mood and affect. Her behavior is normal. Judgment and thought content normal.   Assessment/Plan:  1. Encounter for general adult medical examination with abnormal findings - PHM is updated   2. Uncontrolled type 2 diabetes mellitus with hyperglycemia ([place]) - Diabetic eye exam  - Microalbumin, urine - POCT HgB A1C  3. Morbid obesity ([place]) - Diet and nutrition counseling   4. Mild persistent asthma, uncomplicated - Controlled   5. SOB (shortness of breath) - Deconditioning   6.  Other ovarian dysfunction - BMD   7. Other hyperlipidemia - Stable    General Counseling: [name] verbalizes understanding of the findings of todays visit and agrees with plan of treatment. I have discussed any further diagnostic evaluation that may be needed or ordered today. We also reviewed her medications today. she has been encouraged to call the office with any questions or concerns that should arise related to todays visit.   Orders Placed This Encounter  Procedures  . Microalbumin, urine  . POCT HgB A1C    Time spent:25 [place], MD  Internal Medicine

## 2017-08-28 ENCOUNTER — Encounter: Payer: Self-pay | Admitting: Internal Medicine

## 2017-08-28 LAB — MICROALBUMIN, URINE: Microalbumin, Urine: 29.5 ug/mL

## 2017-09-15 ENCOUNTER — Other Ambulatory Visit: Payer: Self-pay | Admitting: Internal Medicine

## 2017-09-15 DIAGNOSIS — M545 Low back pain: Secondary | ICD-10-CM

## 2017-09-15 DIAGNOSIS — J4530 Mild persistent asthma, uncomplicated: Secondary | ICD-10-CM

## 2017-09-15 DIAGNOSIS — E1165 Type 2 diabetes mellitus with hyperglycemia: Secondary | ICD-10-CM

## 2017-09-15 DIAGNOSIS — R5383 Other fatigue: Secondary | ICD-10-CM

## 2017-09-15 NOTE — Progress Notes (Signed)
d 

## 2017-09-16 ENCOUNTER — Telehealth: Payer: Self-pay

## 2017-09-16 DIAGNOSIS — H40003 Preglaucoma, unspecified, bilateral: Secondary | ICD-10-CM | POA: Diagnosis not present

## 2017-09-16 NOTE — Telephone Encounter (Signed)
Left message and advised pt's husband her lab order is at [facility] and she can go anytime for her labs. Beth

## 2017-09-20 ENCOUNTER — Telehealth: Payer: Self-pay

## 2017-09-20 NOTE — Telephone Encounter (Signed)
Pt advised Mailed labslip  To pt

## 2017-09-20 NOTE — Telephone Encounter (Signed)
Spoke with pt and mailed labslip

## 2017-09-24 ENCOUNTER — Other Ambulatory Visit: Payer: Self-pay | Admitting: Internal Medicine

## 2017-09-24 DIAGNOSIS — E1165 Type 2 diabetes mellitus with hyperglycemia: Secondary | ICD-10-CM | POA: Diagnosis not present

## 2017-09-24 DIAGNOSIS — R5383 Other fatigue: Secondary | ICD-10-CM | POA: Diagnosis not present

## 2017-09-24 DIAGNOSIS — M545 Low back pain: Secondary | ICD-10-CM | POA: Diagnosis not present

## 2017-09-25 LAB — CBC WITH DIFFERENTIAL/PLATELET
BASOS ABS: 0 10*3/uL (ref 0.0–0.2)
Basos: 1 %
EOS (ABSOLUTE): 0.2 10*3/uL (ref 0.0–0.4)
Eos: 4 %
HEMOGLOBIN: 14 g/dL (ref 11.1–15.9)
Hematocrit: 41.3 % (ref 34.0–46.6)
Immature Grans (Abs): 0 10*3/uL (ref 0.0–0.1)
Immature Granulocytes: 0 %
LYMPHS ABS: 1.5 10*3/uL (ref 0.7–3.1)
Lymphs: 26 %
MCH: 28.7 pg (ref 26.6–33.0)
MCHC: 33.9 g/dL (ref 31.5–35.7)
MCV: 85 fL (ref 79–97)
MONOCYTES: 9 %
Monocytes Absolute: 0.5 10*3/uL (ref 0.1–0.9)
NEUTROS PCT: 60 %
Neutrophils Absolute: 3.4 10*3/uL (ref 1.4–7.0)
Platelets: 283 10*3/uL (ref 150–450)
RBC: 4.88 x10E6/uL (ref 3.77–5.28)
RDW: 14.9 % (ref 12.3–15.4)
WBC: 5.7 10*3/uL (ref 3.4–10.8)

## 2017-09-25 LAB — LIPID PANEL WITH LDL/HDL RATIO
CHOLESTEROL TOTAL: 153 mg/dL (ref 100–199)
HDL: 56 mg/dL (ref >39)
LDL CALC: 76 mg/dL (ref 0–99)
LDl/HDL Ratio: 1.4 ratio (ref 0.0–3.2)
Triglycerides: 107 mg/dL (ref 0–149)
VLDL Cholesterol Cal: 21 mg/dL (ref 5–40)

## 2017-09-25 LAB — COMPREHENSIVE METABOLIC PANEL
ALK PHOS: 90 IU/L (ref 39–117)
ALT: 17 IU/L (ref 0–32)
AST: 21 IU/L (ref 0–40)
Albumin/Globulin Ratio: 1.3 (ref 1.2–2.2)
Albumin: 3.9 g/dL (ref 3.5–4.8)
BUN / CREAT RATIO: 17 (ref 12–28)
BUN: 12 mg/dL (ref 8–27)
Bilirubin Total: 0.4 mg/dL (ref 0.0–1.2)
CO2: 25 mmol/L (ref 20–29)
CREATININE: 0.72 mg/dL (ref 0.57–1.00)
Calcium: 9.4 mg/dL (ref 8.7–10.3)
Chloride: 101 mmol/L (ref 96–106)
GFR calc non Af Amer: 85 mL/min/{1.73_m2} (ref >59)
GFR, EST AFRICAN AMERICAN: 98 mL/min/{1.73_m2} (ref >59)
GLUCOSE: 106 mg/dL — AB (ref 65–99)
Globulin, Total: 3 g/dL (ref 1.5–4.5)
Potassium: 4.5 mmol/L (ref 3.5–5.2)
Sodium: 141 mmol/L (ref 134–144)
Total Protein: 6.9 g/dL (ref 6.0–8.5)

## 2017-09-25 LAB — T4, FREE: FREE T4: 1.45 ng/dL (ref 0.82–1.77)

## 2017-09-25 LAB — TSH: TSH: 1.32 u[IU]/mL (ref 0.450–4.500)

## 2017-10-07 ENCOUNTER — Other Ambulatory Visit: Payer: Self-pay | Admitting: Internal Medicine

## 2017-10-07 MED ORDER — FLUTICASONE-SALMETEROL 250-50 MCG/DOSE IN AEPB: 1.0000 | INHALATION_SPRAY | Freq: Two times a day (BID) | RESPIRATORY_TRACT | 4 refills | Status: DC

## 2017-10-08 ENCOUNTER — Telehealth: Payer: Self-pay

## 2017-10-08 DIAGNOSIS — Z23 Encounter for immunization: Secondary | ICD-10-CM | POA: Diagnosis not present

## 2017-10-08 NOTE — Progress Notes (Signed)
Pt was notified.  

## 2017-10-08 NOTE — Telephone Encounter (Signed)
Pt was notified that lab results are normal.

## 2017-11-04 ENCOUNTER — Other Ambulatory Visit: Payer: Self-pay | Admitting: Internal Medicine

## 2017-11-04 MED ORDER — FLUTICASONE-SALMETEROL 250-50 MCG/DOSE IN AEPB: 1.0000 | INHALATION_SPRAY | Freq: Two times a day (BID) | RESPIRATORY_TRACT | 4 refills | Status: DC

## 2017-11-06 ENCOUNTER — Other Ambulatory Visit: Payer: Self-pay

## 2017-11-06 MED ORDER — POTASSIUM CITRATE ER 10 MEQ (1080 MG) PO TBCR: 10.0000 meq | EXTENDED_RELEASE_TABLET | Freq: Two times a day (BID) | ORAL | 1 refills | Status: DC

## 2017-11-28 DIAGNOSIS — L400 Psoriasis vulgaris: Secondary | ICD-10-CM | POA: Diagnosis not present

## 2018-02-03 ENCOUNTER — Other Ambulatory Visit: Payer: Self-pay | Admitting: Adult Health

## 2018-02-03 DIAGNOSIS — L400 Psoriasis vulgaris: Secondary | ICD-10-CM | POA: Diagnosis not present

## 2018-02-03 MED ORDER — OXYBUTYNIN CHLORIDE 5 MG PO TABS: 5.0000 mg | ORAL_TABLET | Freq: Every day | ORAL | 1 refills | Status: DC

## 2018-02-03 MED ORDER — OXYBUTYNIN CHLORIDE ER 5 MG PO TB24: 5.0000 mg | ORAL_TABLET | Freq: Every day | ORAL | 2 refills | Status: DC

## 2018-02-03 MED ORDER — MONTELUKAST SODIUM 10 MG PO TABS: 10.0000 mg | ORAL_TABLET | Freq: Every day | ORAL | 2 refills | Status: DC

## 2018-02-17 ENCOUNTER — Other Ambulatory Visit: Payer: Self-pay

## 2018-02-17 DIAGNOSIS — Z23 Encounter for immunization: Secondary | ICD-10-CM | POA: Diagnosis not present

## 2018-02-17 MED ORDER — FLUOCINONIDE 0.05 % EX SOLN: 1.0000 "application " | Freq: Two times a day (BID) | CUTANEOUS | 1 refills | Status: DC | PRN

## 2018-03-04 ENCOUNTER — Ambulatory Visit (INDEPENDENT_AMBULATORY_CARE_PROVIDER_SITE_OTHER): Payer: Medicare Other | Admitting: Adult Health

## 2018-03-04 ENCOUNTER — Encounter: Payer: Self-pay | Admitting: Adult Health

## 2018-03-04 VITALS — BP 132/88 | HR 74 | Resp 16 | Ht 61.0 in | Wt 251.0 lb

## 2018-03-04 DIAGNOSIS — Z6841 Body Mass Index (BMI) 40.0 and over, adult: Secondary | ICD-10-CM

## 2018-03-04 DIAGNOSIS — E1165 Type 2 diabetes mellitus with hyperglycemia: Secondary | ICD-10-CM | POA: Diagnosis not present

## 2018-03-04 DIAGNOSIS — Z1231 Encounter for screening mammogram for malignant neoplasm of breast: Secondary | ICD-10-CM | POA: Diagnosis not present

## 2018-03-04 DIAGNOSIS — I10 Essential (primary) hypertension: Secondary | ICD-10-CM

## 2018-03-04 DIAGNOSIS — E7849 Other hyperlipidemia: Secondary | ICD-10-CM

## 2018-03-04 DIAGNOSIS — K219 Gastro-esophageal reflux disease without esophagitis: Secondary | ICD-10-CM

## 2018-03-04 DIAGNOSIS — E119 Type 2 diabetes mellitus without complications: Secondary | ICD-10-CM | POA: Diagnosis not present

## 2018-03-04 DIAGNOSIS — M8588 Other specified disorders of bone density and structure, other site: Secondary | ICD-10-CM | POA: Diagnosis not present

## 2018-03-04 DIAGNOSIS — E2839 Other primary ovarian failure: Secondary | ICD-10-CM | POA: Diagnosis not present

## 2018-03-04 DIAGNOSIS — Z780 Asymptomatic menopausal state: Secondary | ICD-10-CM | POA: Diagnosis not present

## 2018-03-04 LAB — POCT GLYCOSYLATED HEMOGLOBIN (HGB A1C): HEMOGLOBIN A1C: 6.6 % — AB (ref 4.0–5.6)

## 2018-03-04 NOTE — Patient Instructions (Signed)
Diabetes Mellitus and Nutrition, Adult  When you have diabetes (diabetes mellitus), it is very important to have healthy eating habits because your blood sugar (glucose) levels are greatly affected by what you eat and drink. Eating healthy foods in the appropriate amounts, at about the same times every day, can help you:  · Control your blood glucose.  · Lower your risk of heart disease.  · Improve your blood pressure.  · Reach or maintain a healthy weight.  Every person with diabetes is different, and each person has different needs for a meal plan. Your health care provider may recommend that you work with a diet and nutrition specialist (dietitian) to make a meal plan that is best for you. Your meal plan may vary depending on factors such as:  · The calories you need.  · The medicines you take.  · Your weight.  · Your blood glucose, blood pressure, and cholesterol levels.  · Your activity level.  · Other health conditions you have, such as heart or kidney disease.  How do carbohydrates affect me?  Carbohydrates, also called carbs, affect your blood glucose level more than any other type of food. Eating carbs naturally raises the amount of glucose in your blood. Carb counting is a method for keeping track of how many carbs you eat. Counting carbs is important to keep your blood glucose at a healthy level, especially if you use insulin or take certain oral diabetes medicines.  It is important to know how many carbs you can safely have in each meal. This is different for every person. Your dietitian can help you calculate how many carbs you should have at each meal and for each snack.  Foods that contain carbs include:  · Bread, cereal, rice, pasta, and crackers.  · Potatoes and corn.  · Peas, beans, and lentils.  · Milk and yogurt.  · Fruit and juice.  · Desserts, such as cakes, cookies, ice cream, and candy.  How does alcohol affect me?  Alcohol can cause a sudden decrease in blood glucose (hypoglycemia),  especially if you use insulin or take certain oral diabetes medicines. Hypoglycemia can be a life-threatening condition. Symptoms of hypoglycemia (sleepiness, dizziness, and confusion) are similar to symptoms of having too much alcohol.  If your health care provider says that alcohol is safe for you, follow these guidelines:  · Limit alcohol intake to no more than 1 drink per day for nonpregnant women and 2 drinks per day for men. One drink equals 12 oz of beer, 5 oz of wine, or 1½ oz of hard liquor.  · Do not drink on an empty stomach.  · Keep yourself hydrated with water, diet soda, or unsweetened iced tea.  · Keep in mind that regular soda, juice, and other mixers may contain a lot of sugar and must be counted as carbs.  What are tips for following this plan?    Reading food labels  · Start by checking the serving size on the "Nutrition Facts" label of packaged foods and drinks. The amount of calories, carbs, fats, and other nutrients listed on the label is based on one serving of the item. Many items contain more than one serving per package.  · Check the total grams (g) of carbs in one serving. You can calculate the number of servings of carbs in one serving by dividing the total carbs by 15. For example, if a food has 30 g of total carbs, it would be equal to 2   servings of carbs.  · Check the number of grams (g) of saturated and trans fats in one serving. Choose foods that have low or no amount of these fats.  · Check the number of milligrams (mg) of salt (sodium) in one serving. Most people should limit total sodium intake to less than 2,300 mg per day.  · Always check the nutrition information of foods labeled as "low-fat" or "nonfat". These foods may be higher in added sugar or refined carbs and should be avoided.  · Talk to your dietitian to identify your daily goals for nutrients listed on the label.  Shopping  · Avoid buying canned, premade, or processed foods. These foods tend to be high in fat, sodium,  and added sugar.  · Shop around the outside edge of the grocery store. This includes fresh fruits and vegetables, bulk grains, fresh meats, and fresh dairy.  Cooking  · Use low-heat cooking methods, such as baking, instead of high-heat cooking methods like deep frying.  · Cook using healthy oils, such as olive, canola, or sunflower oil.  · Avoid cooking with butter, cream, or high-fat meats.  Meal planning  · Eat meals and snacks regularly, preferably at the same times every day. Avoid going long periods of time without eating.  · Eat foods high in fiber, such as fresh fruits, vegetables, beans, and whole grains. Talk to your dietitian about how many servings of carbs you can eat at each meal.  · Eat 4-6 ounces (oz) of lean protein each day, such as lean meat, chicken, fish, eggs, or tofu. One oz of lean protein is equal to:  ? 1 oz of meat, chicken, or fish.  ? 1 egg.  ? ¼ cup of tofu.  · Eat some foods each day that contain healthy fats, such as avocado, nuts, seeds, and fish.  Lifestyle  · Check your blood glucose regularly.  · Exercise regularly as told by your health care provider. This may include:  ? 150 minutes of moderate-intensity or vigorous-intensity exercise each week. This could be brisk walking, biking, or water aerobics.  ? Stretching and doing strength exercises, such as yoga or weightlifting, at least 2 times a week.  · Take medicines as told by your health care provider.  · Do not use any products that contain nicotine or tobacco, such as cigarettes and e-cigarettes. If you need help quitting, ask your health care provider.  · Work with a counselor or diabetes educator to identify strategies to manage stress and any emotional and social challenges.  Questions to ask a health care provider  · Do I need to meet with a diabetes educator?  · Do I need to meet with a dietitian?  · What number can I call if I have questions?  · When are the best times to check my blood glucose?  Where to find more  information:  · American Diabetes Association: diabetes.org  · Academy of Nutrition and Dietetics: www.eatright.org  · National Institute of Diabetes and Digestive and Kidney Diseases (NIH): www.niddk.nih.gov  Summary  · A healthy meal plan will help you control your blood glucose and maintain a healthy lifestyle.  · Working with a diet and nutrition specialist (dietitian) can help you make a meal plan that is best for you.  · Keep in mind that carbohydrates (carbs) and alcohol have immediate effects on your blood glucose levels. It is important to count carbs and to use alcohol carefully.  This information is not intended to   replace advice given to you by your health care provider. Make sure you discuss any questions you have with your health care provider.  Document Released: 09/21/2004 Document Revised: 07/25/2016 Document Reviewed: 01/30/2016  Elsevier Interactive Patient Education © 2019 [employer]

## 2018-03-04 NOTE — Progress Notes (Signed)
[facility] [place], [place] [address]  Internal MEDICINE  Office Visit Note  Patient Name: Cassidy Parsons  [id number]  [id number]  Date of Service: 03/04/2018  Chief Complaint  Patient presents with  . Gastroesophageal Reflux  . Hypertension  . Quality Metric Gaps    foot exam     HPI Pt is here for follow up on GERD, and HTN. Overall she is doing well.  She is experiencing some caregiver strain due to taking care of her ailing husband who has recently been placed on hospice services.  Pts blood pressure is well controlled at this time 132/88.  She denies any GERD symptoms at this time.  She is in need of a diabetic foot exam to close her quality metric gaps, it will be done at this time.    Current Medication: Outpatient Encounter Medications as of 03/04/2018  Medication Sig  . aspirin EC 81 MG tablet Take 81 mg by mouth daily.  Marland Kitchen azelastine (ASTELIN) 0.1 % nasal spray Place 1 spray into both nostrils daily. Use in each nostril as directed  . Calcium Carb-Cholecalciferol (CALCIUM 600 + D) 600-200 MG-UNIT TABS Take 1 tablet by mouth daily.  . cetirizine (ZYRTEC) 10 MG tablet Take 10 mg by mouth daily.  Marland Kitchen docusate sodium (COLACE) 100 MG capsule Take 200 mg by mouth at bedtime.  Marland Kitchen esomeprazole (NEXIUM) 40 MG capsule Take 40 mg by mouth daily at 12 noon.  . fluocinonide (LIDEX) 0.05 % external solution Apply 1 application topically 2 (two) times daily as needed (for infection).  . fluticasone (FLONASE) 50 MCG/ACT nasal spray Place 1 spray into both nostrils at bedtime.  . Fluticasone-Salmeterol (ADVAIR) 250-50 MCG/DOSE AEPB Inhale 1 puff into the lungs 2 (two) times daily.  . montelukast (SINGULAIR) 10 MG tablet Take 1 tablet (10 mg total) by mouth at bedtime.  . Multiple Vitamins-Minerals (MULTIVITAMIN ADULT PO) Take 1 tablet by mouth daily.  Marland Kitchen oxybutynin (DITROPAN) 5 MG tablet Take 1 tablet (5 mg total) by mouth at bedtime.  . potassium citrate  (UROCIT-K) 10 MEQ (1080 MG) SR tablet Take 1 tablet (10 mEq total) by mouth 2 (two) times daily.  . [DISCONTINUED] doxycycline (VIBRAMYCIN) 50 MG capsule Take 1 capsule (50 mg total) by mouth 2 (two) times daily. (Patient not taking: Reported on 03/04/2018)  . [DISCONTINUED] HYDROcodone-acetaminophen (NORCO) 5-325 MG tablet Take 1 tablet by mouth every 6 (six) hours as needed for moderate pain. (Patient not taking: Reported on 03/04/2018)   No facility-administered encounter medications on file as of 03/04/2018.     Surgical History: Past Surgical History:  Procedure Laterality Date  . BRAIN SURGERY    . BREAST SURGERY     biopsy right breast  . CRANIOTOMY N/A 02/10/2015   Procedure: Removal of cranial plate with scalp wound revision;  Surgeon: Earnie Larsson, MD;  Location: [facility];  Service: Neurosurgery;  Laterality: N/A;  . DIAGNOSTIC LAPAROSCOPY     panniculectomy  . TONSILLECTOMY      Medical History: Past Medical History:  Diagnosis Date  . Asthma   . Brain tumor (benign) ([place])    right side  . Diabetes mellitus without complication (HCC)    borderline  . Early cataracts, bilateral   . Environmental and seasonal allergies   . GERD (gastroesophageal reflux disease)   . Hypertension    PMH:  . On home oxygen therapy    at night only  . Seizures ([place])    haven't  had a seizure since brain surgery in 2001    Family History: Family History  Problem Relation Age of Onset  . Cancer Mother   . Heart failure Father   . Stroke Sister   . Diabetes Other     Social History   Socioeconomic History  . Marital status: Married    Spouse name: Not on file  . Number of children: Not on file  . Years of education: Not on file  . Highest education level: Not on file  Occupational History  . Not on file  Social Needs  . Financial resource strain: Not on file  . Food insecurity:    Worry: Not on file    Inability: Not on file  . Transportation needs:    Medical: Not on  file    Non-medical: Not on file  Tobacco Use  . Smoking status: Former Smoker    Types: Cigarettes  . Smokeless tobacco: Never Used  . Tobacco comment: quit smoking cigarettes 34 years ago  Substance and Sexual Activity  . Alcohol use: No  . Drug use: No  . Sexual activity: Not on file  Lifestyle  . Physical activity:    Days per week: Not on file    Minutes per session: Not on file  . Stress: Not on file  Relationships  . Social connections:    Talks on phone: Not on file    Gets together: Not on file    Attends religious service: Not on file    Active member of club or organization: Not on file    Attends meetings of clubs or organizations: Not on file    Relationship status: Not on file  . Intimate partner violence:    Fear of current or ex partner: Not on file    Emotionally abused: Not on file    Physically abused: Not on file    Forced sexual activity: Not on file  Other Topics Concern  . Not on file  Social History Narrative  . Not on file      Review of Systems  Constitutional: Negative for chills, fatigue and unexpected weight change.  HENT: Negative for congestion, rhinorrhea, sneezing and sore throat.   Eyes: Negative for photophobia, pain and redness.  Respiratory: Negative for cough, chest tightness and shortness of breath.   Cardiovascular: Negative for chest pain and palpitations.  Gastrointestinal: Negative for abdominal pain, constipation, diarrhea, nausea and vomiting.  Endocrine: Negative.   Genitourinary: Negative for dysuria and frequency.  Musculoskeletal: Negative for arthralgias, back pain, joint swelling and neck pain.  Skin: Negative for rash.  Allergic/Immunologic: Negative.   Neurological: Negative for tremors and numbness.  Hematological: Negative for adenopathy. Does not bruise/bleed easily.  Psychiatric/Behavioral: Negative for behavioral problems and sleep disturbance. The patient is not nervous/anxious.     Vital Signs: BP  132/88   Pulse 74   Resp 16   Ht 5\' 1"  (1.549 m)   Wt 251 lb (113.9 kg)   SpO2 97%   BMI 47.43 kg/m    Physical Exam Vitals signs and nursing note reviewed.  Constitutional:      General: She is not in acute distress.    Appearance: She is well-developed. She is not diaphoretic.  HENT:     Head: Normocephalic and atraumatic.     Mouth/Throat:     Pharynx: No oropharyngeal exudate.  Eyes:     Pupils: Pupils are equal, round, and reactive to light.  Neck:  Musculoskeletal: Normal range of motion and neck supple.     Thyroid: No thyromegaly.     Vascular: No JVD.     Trachea: No tracheal deviation.  Cardiovascular:     Rate and Rhythm: Normal rate and regular rhythm.     Heart sounds: Normal heart sounds. No murmur. No friction rub. No gallop.   Pulmonary:     Effort: Pulmonary effort is normal. No respiratory distress.     Breath sounds: Normal breath sounds. No wheezing or rales.  Chest:     Chest wall: No tenderness.  Abdominal:     Palpations: Abdomen is soft.     Tenderness: There is no abdominal tenderness. There is no guarding.  Musculoskeletal: Normal range of motion.  Lymphadenopathy:     Cervical: No cervical adenopathy.  Skin:    General: Skin is warm and dry.  Neurological:     Mental Status: She is alert and oriented to person, place, and time.     Cranial Nerves: No cranial nerve deficit.  Psychiatric:        Behavior: Behavior normal.        Thought Content: Thought content normal.        Judgment: Judgment normal.    Assessment/Plan: 1. Uncontrolled type 2 diabetes mellitus with hyperglycemia (HCC) Hemoglobin A1c at this visit increased from 6.1-6.6.  Patient reports she has not been doing well with diet and exercise since her husband has been increasingly sick.  She is able to take any medications like metformin as they have made her very sick in the past.  She reports that she has hospice help now and she is hopeful to be able to take some time  to work on her diet and begin walking again. - POCT HgB A1C  2. Essential hypertension Stable, continue current medications as prescribed.  3. Gastroesophageal reflux disease without esophagitis Stable, continue current medications.  4. Other hyperlipidemia Most recent lipid panel is within normal limits.  Continue current therapy.  5. Encounter for diabetic foot exam ([place]) Foot exam provided at this time.   6. Class 3 severe obesity due to excess calories with serious comorbidity and body mass index (BMI) of 45.0 to 49.9 in adult [facility]) Obesity Counseling: Risk Assessment: An assessment of behavioral risk factors was made today and includes lack of exercise sedentary lifestyle, lack of portion control and poor dietary habits.  Risk Modification Advice: She was counseled on portion control guidelines. Restricting daily caloric intake to. . The detrimental long term effects of obesity on her health and ongoing poor compliance was also discussed with the patient.    General Counseling: romaine maciolek understanding of the findings of todays visit and agrees with plan of treatment. I have discussed any further diagnostic evaluation that may be needed or ordered today. We also reviewed her medications today. she has been encouraged to call the office with any questions or concerns that should arise related to todays visit.    Orders Placed This Encounter  Procedures  . POCT HgB A1C    No orders of the defined types were placed in this encounter.   Time spent: 25 Minutes   This patient was seen by Orson Gear AGNP-C in Collaboration with Dr Lavera Guise as a part of collaborative care agreement     Kendell Bane AGNP-C Internal medicine

## 2018-03-05 ENCOUNTER — Other Ambulatory Visit: Payer: Self-pay

## 2018-03-05 MED ORDER — AZELASTINE HCL 0.1 % NA SOLN: 1.0000 | Freq: Every day | NASAL | 1 refills | Status: DC

## 2018-04-02 ENCOUNTER — Other Ambulatory Visit: Payer: Self-pay

## 2018-04-02 DIAGNOSIS — J309 Allergic rhinitis, unspecified: Secondary | ICD-10-CM

## 2018-04-02 MED ORDER — FLUTICASONE PROPIONATE 50 MCG/ACT NA SUSP: 1.0000 | Freq: Every day | NASAL | 6 refills | Status: DC

## 2018-04-29 ENCOUNTER — Other Ambulatory Visit: Payer: Self-pay

## 2018-04-29 MED ORDER — POTASSIUM CITRATE ER 10 MEQ (1080 MG) PO TBCR: 10.0000 meq | EXTENDED_RELEASE_TABLET | Freq: Two times a day (BID) | ORAL | 1 refills | Status: DC

## 2018-05-13 ENCOUNTER — Other Ambulatory Visit: Payer: Self-pay | Admitting: Adult Health

## 2018-07-19 ENCOUNTER — Other Ambulatory Visit: Payer: Self-pay | Admitting: Adult Health

## 2018-07-23 DIAGNOSIS — L400 Psoriasis vulgaris: Secondary | ICD-10-CM | POA: Diagnosis not present

## 2018-08-07 ENCOUNTER — Encounter: Payer: Self-pay | Admitting: Nurse Practitioner

## 2018-08-07 ENCOUNTER — Other Ambulatory Visit: Payer: Self-pay

## 2018-08-07 ENCOUNTER — Ambulatory Visit (INDEPENDENT_AMBULATORY_CARE_PROVIDER_SITE_OTHER): Payer: Medicare Other | Admitting: Nurse Practitioner

## 2018-08-07 VITALS — BP 145/90 | HR 91 | Temp 98.1°F | Resp 16 | Ht 61.0 in | Wt 252.0 lb

## 2018-08-07 DIAGNOSIS — R300 Dysuria: Secondary | ICD-10-CM | POA: Diagnosis not present

## 2018-08-07 DIAGNOSIS — N390 Urinary tract infection, site not specified: Secondary | ICD-10-CM

## 2018-08-07 LAB — POCT URINALYSIS DIPSTICK
Bilirubin, UA: NEGATIVE
Glucose, UA: NEGATIVE
Ketones, UA: NEGATIVE
Nitrite, UA: POSITIVE
Protein, UA: POSITIVE — AB
Spec Grav, UA: 1.01 (ref 1.010–1.025)
Urobilinogen, UA: 0.2 E.U./dL
pH, UA: 5 (ref 5.0–8.0)

## 2018-08-07 MED ORDER — SULFAMETHOXAZOLE-TRIMETHOPRIM 800-160 MG PO TABS: 1.0000 | ORAL_TABLET | Freq: Two times a day (BID) | ORAL | 0 refills | Status: DC

## 2018-08-07 MED ORDER — PHENAZOPYRIDINE HCL 200 MG PO TABS: 200.0000 mg | ORAL_TABLET | Freq: Three times a day (TID) | ORAL | 0 refills | Status: DC | PRN

## 2018-08-07 NOTE — Progress Notes (Signed)
[facility] [place], [place] [address]  Internal MEDICINE  Office Visit Note  Patient Name: Cassidy Parsons  [id number]  [id number]  Date of Service: 08/07/2018   Pt is here for a sick visit.  Chief Complaint  Patient presents with  . Urinary Tract Infection    burning sensation     The patient is here for sick visit. She has had she has had burning with urination and dysuria for psat two days. Gradually getting worse. Had slight, low-grade fever yesterday. She feels some malaise as well. Denies nausea, vomiting, or diarrhea. Feels good otherwise        Current Medication:  Outpatient Encounter Medications as of 08/07/2018  Medication Sig  . aspirin EC 81 MG tablet Take 81 mg by mouth daily.  Marland Kitchen azelastine (ASTELIN) 0.1 % nasal spray Place 1 spray into both nostrils daily. Use in each nostril as directed  . Calcium Carb-Cholecalciferol (CALCIUM 600 + D) 600-200 MG-UNIT TABS Take 1 tablet by mouth daily.  . cetirizine (ZYRTEC) 10 MG tablet Take 10 mg by mouth daily.  Marland Kitchen docusate sodium (COLACE) 100 MG capsule Take 200 mg by mouth at bedtime.  Marland Kitchen esomeprazole (NEXIUM) 40 MG capsule Take 40 mg by mouth daily at 12 noon.  . fluocinonide (LIDEX) 0.05 % external solution Apply 1 application topically 2 (two) times daily as needed (for infection).  . fluticasone (FLONASE) 50 MCG/ACT nasal spray Place 1 spray into both nostrils at bedtime.  . Fluticasone-Salmeterol (ADVAIR) 250-50 MCG/DOSE AEPB Inhale 1 puff into the lungs 2 (two) times daily.  . montelukast (SINGULAIR) 10 MG tablet Take 1 tablet (10 mg total) by mouth at bedtime.  . Multiple Vitamins-Minerals (MULTIVITAMIN ADULT PO) Take 1 tablet by mouth daily.  Marland Kitchen oxybutynin (DITROPAN) 5 MG tablet TAKE (1) TABLET BY MOUTH EVERY DAY  . potassium citrate (UROCIT-K) 10 MEQ (1080 MG) SR tablet TAKE (1) TABLET BY MOUTH TWICE DAILY  . phenazopyridine (PYRIDIUM) 200 MG tablet Take 1 tablet (200 mg total) by  mouth 3 (three) times daily as needed for pain.  Marland Kitchen sulfamethoxazole-trimethoprim (BACTRIM DS) 800-160 MG tablet Take 1 tablet by mouth 2 (two) times daily.  . [DISCONTINUED] phenazopyridine (PYRIDIUM) 200 MG tablet Take 1 tablet (200 mg total) by mouth 3 (three) times daily as needed for pain.  . [DISCONTINUED] sulfamethoxazole-trimethoprim (BACTRIM DS) 800-160 MG tablet Take 1 tablet by mouth 2 (two) times daily.   No facility-administered encounter medications on file as of 08/07/2018.       Medical History: Past Medical History:  Diagnosis Date  . Asthma   . Brain tumor (benign) ([place])    right side  . Diabetes mellitus without complication (HCC)    borderline  . Early cataracts, bilateral   . Environmental and seasonal allergies   . GERD (gastroesophageal reflux disease)   . Hypertension    PMH:  . On home oxygen therapy    at night only  . Seizures ([place])    haven't had a seizure since brain surgery in 2001     Today's Vitals   08/07/18 1424  BP: (!) 145/90  Pulse: 91  Resp: 16  Temp: 98.1 F (36.7 C)  SpO2: 95%  Weight: 252 lb (114.3 kg)  Height: 5\' 1"  (1.549 m)   Body mass index is 47.61 kg/m.  Review of Systems  Constitutional: Positive for fatigue and fever. Negative for chills and unexpected weight change.       Low-grade fever yesterday  HENT: Negative for congestion, postnasal drip, rhinorrhea, sneezing and sore throat.   Respiratory: Negative for cough, chest tightness and shortness of breath.   Cardiovascular: Negative for chest pain and palpitations.  Gastrointestinal: Negative for abdominal pain, constipation, diarrhea, nausea and vomiting.  Genitourinary: Positive for dysuria, flank pain, frequency and urgency.  Musculoskeletal: Negative for arthralgias, back pain, joint swelling and neck pain.  Skin: Negative for rash.  Neurological: Negative for tremors, numbness and headaches.  Hematological: Negative for adenopathy. Does not bruise/bleed  easily.  Psychiatric/Behavioral: Negative for behavioral problems (Depression), sleep disturbance and suicidal ideas. The patient is not nervous/anxious.     Physical Exam Vitals signs and nursing note reviewed.  Constitutional:      General: She is not in acute distress.    Appearance: Normal appearance. She is well-developed. She is not diaphoretic.  HENT:     Head: Normocephalic and atraumatic.     Mouth/Throat:     Pharynx: No oropharyngeal exudate.  Eyes:     Pupils: Pupils are equal, round, and reactive to light.  Neck:     Musculoskeletal: Normal range of motion and neck supple.     Thyroid: No thyromegaly.     Vascular: No JVD.     Trachea: No tracheal deviation.  Cardiovascular:     Rate and Rhythm: Normal rate and regular rhythm.     Heart sounds: Normal heart sounds. No murmur. No friction rub. No gallop.   Pulmonary:     Effort: Pulmonary effort is normal. No respiratory distress.     Breath sounds: Normal breath sounds. No wheezing or rales.  Chest:     Chest wall: No tenderness.  Abdominal:     General: Bowel sounds are normal.     Palpations: Abdomen is soft.  Genitourinary:    Comments: The urine sample is positive for protein and nitrites. Also positive for moderate WBC and large blood.  Musculoskeletal: Normal range of motion.  Lymphadenopathy:     Cervical: No cervical adenopathy.  Skin:    General: Skin is warm and dry.  Neurological:     Mental Status: She is alert and oriented to person, place, and time.     Cranial Nerves: No cranial nerve deficit.  Psychiatric:        Behavior: Behavior normal.        Thought Content: Thought content normal.        Judgment: Judgment normal.    Assessment/Plan: 1. Urinary tract infection without hematuria, site unspecified Start bactrim DS bid for 7 days. Send urine for culture and sensitivity and will adjust antibiotics as indicated.  - sulfamethoxazole-trimethoprim (BACTRIM DS) 800-160 MG tablet; Take 1  tablet by mouth 2 (two) times daily.  Dispense: 14 tablet; Refill: 0  2. Dysuria Pyridu=ium 200mg  may be taken up to three times daily if needed for bladder pain and spasms.  - POCT Urinalysis Dipstick - CULTURE, URINE COMPREHENSIVE - phenazopyridine (PYRIDIUM) 200 MG tablet; Take 1 tablet (200 mg total) by mouth 3 (three) times daily as needed for pain.  Dispense: 10 tablet; Refill: 0  General Counseling: Roselin verbalizes understanding of the findings of todays visit and agrees with plan of treatment. I have discussed any further diagnostic evaluation that may be needed or ordered today. We also reviewed her medications today. she has been encouraged to call the office with any questions or concerns that should arise related to todays visit.    Counseling:  This patient was seen by Leretha Pol FNP  Collaboration with Dr Lavera Guise as a part of collaborative care agreement  Orders Placed This Encounter  Procedures  . CULTURE, URINE COMPREHENSIVE  . POCT Urinalysis Dipstick    Meds ordered this encounter  Medications  . DISCONTD: sulfamethoxazole-trimethoprim (BACTRIM DS) 800-160 MG tablet    Sig: Take 1 tablet by mouth 2 (two) times daily.    Dispense:  14 tablet    Refill:  0    Order Specific Question:   Supervising Provider    Answer:   Lavera Guise [[id number]]  . DISCONTD: phenazopyridine (PYRIDIUM) 200 MG tablet    Sig: Take 1 tablet (200 mg total) by mouth 3 (three) times daily as needed for pain.    Dispense:  10 tablet    Refill:  0    Order Specific Question:   Supervising Provider    Answer:   Lavera Guise [[id number]]  . sulfamethoxazole-trimethoprim (BACTRIM DS) 800-160 MG tablet    Sig: Take 1 tablet by mouth 2 (two) times daily.    Dispense:  14 tablet    Refill:  0    Order Specific Question:   Supervising Provider    Answer:   Lavera Guise [[id number]]  . phenazopyridine (PYRIDIUM) 200 MG tablet    Sig: Take 1 tablet (200 mg total) by mouth 3 (three) times daily as  needed for pain.    Dispense:  10 tablet    Refill:  0    Order Specific Question:   Supervising Provider    Answer:   Lavera Guise [[id number]]    Time spent: 25 Minutes

## 2018-08-10 LAB — CULTURE, URINE COMPREHENSIVE

## 2018-08-20 NOTE — Progress Notes (Signed)
Patient started on bactrim

## 2018-08-28 ENCOUNTER — Ambulatory Visit: Payer: Self-pay | Admitting: Internal Medicine

## 2018-09-03 ENCOUNTER — Ambulatory Visit: Payer: Self-pay | Admitting: Adult Health

## 2018-09-23 DIAGNOSIS — Z23 Encounter for immunization: Secondary | ICD-10-CM | POA: Diagnosis not present

## 2018-10-27 ENCOUNTER — Other Ambulatory Visit: Payer: Self-pay | Admitting: Adult Health

## 2018-10-30 ENCOUNTER — Encounter: Payer: Self-pay | Admitting: Internal Medicine

## 2018-10-30 ENCOUNTER — Other Ambulatory Visit: Payer: Self-pay

## 2018-10-30 ENCOUNTER — Ambulatory Visit (INDEPENDENT_AMBULATORY_CARE_PROVIDER_SITE_OTHER): Payer: Medicare Other | Admitting: Internal Medicine

## 2018-10-30 VITALS — BP 128/84 | HR 88 | Temp 97.2°F | Resp 16 | Ht 61.0 in | Wt 255.0 lb

## 2018-10-30 DIAGNOSIS — J4530 Mild persistent asthma, uncomplicated: Secondary | ICD-10-CM

## 2018-10-30 DIAGNOSIS — J309 Allergic rhinitis, unspecified: Secondary | ICD-10-CM

## 2018-10-30 DIAGNOSIS — G4700 Insomnia, unspecified: Secondary | ICD-10-CM

## 2018-10-30 DIAGNOSIS — R0602 Shortness of breath: Secondary | ICD-10-CM

## 2018-10-30 NOTE — Progress Notes (Signed)
[facility] [place], [place] [address]  Pulmonary Sleep Medicine   Office Visit Note  Patient Name: Cassidy Parsons DOB: 1945-03-28 MRN [id number]  Date of Service: 10/30/2018  Complaints/HPI: Patient at this time is doing well she has had no flareups of her asthma.  She continues to use her medications as prescribed.  Denies any cough.  Only stress is dealing with her husband's illness who is at home but is with hospice.  No admissions to the hospital.  Denies having any fevers or chills  ROS  General: (-) fever, (-) chills, (-) night sweats, (-) weakness Skin: (-) rashes, (-) itching,. Eyes: (-) visual changes, (-) redness, (-) itching. Nose and Sinuses: (-) nasal stuffiness or itchiness, (-) postnasal drip, (-) nosebleeds, (-) sinus trouble. Mouth and Throat: (-) sore throat, (-) hoarseness. Neck: (-) swollen glands, (-) enlarged thyroid, (-) neck pain. Respiratory: - cough, (-) bloody sputum, - shortness of breath, - wheezing. Cardiovascular: - ankle swelling, (-) chest pain. Lymphatic: (-) lymph node enlargement. Neurologic: (-) numbness, (-) tingling. Psychiatric: (-) anxiety, (-) depression   Current Medication: Outpatient Encounter Medications as of 10/30/2018  Medication Sig  . aspirin EC 81 MG tablet Take 81 mg by mouth daily.  Marland Kitchen azelastine (ASTELIN) 0.1 % nasal spray Place 1 spray into both nostrils daily. Use in each nostril as directed  . Calcium Carb-Cholecalciferol (CALCIUM 600 + D) 600-200 MG-UNIT TABS Take 1 tablet by mouth daily.  . cetirizine (ZYRTEC) 10 MG tablet Take 10 mg by mouth daily.  Marland Kitchen docusate sodium (COLACE) 100 MG capsule Take 200 mg by mouth at bedtime.  Marland Kitchen esomeprazole (NEXIUM) 40 MG capsule Take 40 mg by mouth daily at 12 noon.  . fluocinonide (LIDEX) 0.05 % external solution Apply 1 application topically 2 (two) times daily as needed (for infection).  . fluticasone (FLONASE) 50 MCG/ACT nasal spray Place 1 spray into  both nostrils at bedtime.  . Fluticasone-Salmeterol (ADVAIR) 250-50 MCG/DOSE AEPB Inhale 1 puff into the lungs 2 (two) times daily.  . montelukast (SINGULAIR) 10 MG tablet TAKE ONE TABLET BY MOUTH AT BEDTIME.  . Multiple Vitamins-Minerals (MULTIVITAMIN ADULT PO) Take 1 tablet by mouth daily.  Marland Kitchen oxybutynin (DITROPAN) 5 MG tablet TAKE (1) TABLET BY MOUTH EVERY DAY  . phenazopyridine (PYRIDIUM) 200 MG tablet Take 1 tablet (200 mg total) by mouth 3 (three) times daily as needed for pain.  . potassium citrate (UROCIT-K) 10 MEQ (1080 MG) SR tablet TAKE (1) TABLET BY MOUTH TWICE DAILY  . [DISCONTINUED] sulfamethoxazole-trimethoprim (BACTRIM DS) 800-160 MG tablet Take 1 tablet by mouth 2 (two) times daily.   No facility-administered encounter medications on file as of 10/30/2018.     Surgical History: Past Surgical History:  Procedure Laterality Date  . BRAIN SURGERY    . BREAST SURGERY     biopsy right breast  . CRANIOTOMY N/A 02/10/2015   Procedure: Removal of cranial plate with scalp wound revision;  Surgeon: Earnie Larsson, MD;  Location: [facility];  Service: Neurosurgery;  Laterality: N/A;  . DIAGNOSTIC LAPAROSCOPY     panniculectomy  . TONSILLECTOMY      Medical History: Past Medical History:  Diagnosis Date  . Asthma   . Brain tumor (benign) ([place])    right side  . Diabetes mellitus without complication (HCC)    borderline  . Early cataracts, bilateral   . Environmental and seasonal allergies   . GERD (gastroesophageal reflux disease)   . Hypertension  PMH:  . On home oxygen therapy    at night only  . Seizures ([place])    haven't had a seizure since brain surgery in 2001    Family History: Family History  Problem Relation Age of Onset  . Cancer Mother   . Heart failure Father   . Stroke Sister   . Diabetes Other     Social History: Social History   Socioeconomic History  . Marital status: Married    Spouse name: Not on file  . Number of children: Not on file  .  Years of education: Not on file  . Highest education level: Not on file  Occupational History  . Not on file  Social Needs  . Financial resource strain: Not on file  . Food insecurity    Worry: Not on file    Inability: Not on file  . Transportation needs    Medical: Not on file    Non-medical: Not on file  Tobacco Use  . Smoking status: Former Smoker    Types: Cigarettes  . Smokeless tobacco: Never Used  . Tobacco comment: quit smoking cigarettes 34 years ago  Substance and Sexual Activity  . Alcohol use: No  . Drug use: No  . Sexual activity: Not on file  Lifestyle  . Physical activity    Days per week: Not on file    Minutes per session: Not on file  . Stress: Not on file  Relationships  . Social Herbalist on phone: Not on file    Gets together: Not on file    Attends religious service: Not on file    Active member of club or organization: Not on file    Attends meetings of clubs or organizations: Not on file    Relationship status: Not on file  . Intimate partner violence    Fear of current or ex partner: Not on file    Emotionally abused: Not on file    Physically abused: Not on file    Forced sexual activity: Not on file  Other Topics Concern  . Not on file  Social History Narrative  . Not on file    Vital Signs: Blood pressure 128/84, pulse 88, temperature (!) 97.2 F (36.2 C), resp. rate 16, height 5\' 1"  (1.549 m), weight 255 lb (115.7 kg), SpO2 95 %.  Examination: General Appearance: The patient is well-developed, well-nourished, and in no distress. Skin: Gross inspection of skin unremarkable. Head: normocephalic, no gross deformities. Eyes: no gross deformities noted. ENT: ears appear grossly normal no exudates. Neck: Supple. No thyromegaly. No LAD. Respiratory: no rhonchi noted at this time. Cardiovascular: Normal S1 and S2 without murmur or rub. Extremities: No cyanosis. pulses are equal. Neurologic: Alert and oriented. No involuntary  movements.  LABS: Recent Results (from the past 2160 hour(s))  CULTURE, URINE COMPREHENSIVE     Status: Abnormal   Collection Time: 08/07/18  2:35 AM   Specimen: Urine   URINE  Result Value Ref Range   Urine Culture, Comprehensive Final report (A)    Organism ID, Bacteria Escherichia coli (A)     Comment: Greater than 100,000 colony forming units per mL Cefazolin <=4 ug/mL Cefazolin with an MIC <=16 predicts susceptibility to the oral agents cefaclor, cefdinir, cefpodoxime, cefprozil, cefuroxime, cephalexin, and loracarbef when used for therapy of uncomplicated urinary tract infections due to E. coli, Klebsiella pneumoniae, and Proteus mirabilis.    Organism ID, Bacteria Comment     Comment: Mixed urogenital  flora 10,000-25,000 colony forming units per mL    ANTIMICROBIAL SUSCEPTIBILITY Comment     Comment:       ** S = Susceptible; I = Intermediate; R = Resistant **                    P = Positive; N = Negative             MICS are expressed in micrograms per mL    Antibiotic                 RSLT#1    RSLT#2    RSLT#3    RSLT#4 Amoxicillin/Clavulanic Acid    S Ampicillin                     S Cefepime                       S Ceftriaxone                    S Cefuroxime                     S Ciprofloxacin                  S Ertapenem                      S Gentamicin                     S Imipenem                       S Levofloxacin                   S Meropenem                      S Nitrofurantoin                 S Piperacillin/Tazobactam        S Tetracycline                   S Tobramycin                     S Trimethoprim/Sulfa             S   POCT Urinalysis Dipstick     Status: Abnormal   Collection Time: 08/07/18  2:32 PM  Result Value Ref Range   Color, UA     Clarity, UA     Glucose, UA Negative Negative   Bilirubin, UA negative    Ketones, UA negative    Spec Grav, UA 1.010 1.010 - 1.025   Blood, UA large    pH, UA 5.0 5.0 - 8.0   Protein, UA  Positive (A) Negative   Urobilinogen, UA 0.2 0.2 or 1.0 E.U./dL   Nitrite, UA positive    Leukocytes, UA Moderate (2+) (A) Negative   Appearance     Odor      Radiology: No results found.  No results found.  No results found.    Assessment and Plan: Patient Active Problem List   Diagnosis Date Noted  . Urinary tract infection without hematuria 08/07/2018  . Dysuria 08/07/2018  . Low back pain 02/05/2017  . Mild persistent asthma, uncomplicated Q000111Q  . Rhinitis, allergic 02/05/2017  .  GAD (generalized anxiety disorder) 02/05/2017  . Unspecified complication of internal orthopedic prosthetic device, implant and graft, initial encounter ([place]) 02/05/2017  . Diabetes mellitus without complication ([place]) Q000111Q  . Gout, unspecified 02/05/2017  . Occlusion and stenosis of bilateral carotid arteries 02/05/2017  . Insomnia, unspecified 02/05/2017  . Sleep apnea 02/05/2017  . Elevated blood-pressure reading without diagnosis of hypertension 02/05/2017  . Dupuytren's disease of palm 10/05/2016  . Exposed orthopaedic hardware ([place]) 02/10/2015    1. Asthma controlled we will continue with present management.  Will continue to use bronchodilators. 2. Allergic rhiniits on antihistamines we will continue with supportive care 3. Morbid obesity dietary management 4. Insomnia stable she has a lot of stressors in her life which probably can attributing  General Counseling: I have discussed the findings of the evaluation and examination with Horris Latino.  I have also discussed any further diagnostic evaluation thatmay be needed or ordered today. Kindra verbalizes understanding of the findings of todays visit. We also reviewed her medications today and discussed drug interactions and side effects including but not limited excessive drowsiness and altered mental states. We also discussed that there is always a risk not just to her but also people around her. she has been encouraged to call the  office with any questions or concerns that should arise related to todays visit.    Time spent: 10min  I have personally obtained a history, examined the patient, evaluated laboratory and imaging results, formulated the assessment and plan and placed orders.    Allyne Gee, MD [facility] Pulmonary and Critical Care Sleep medicine

## 2018-10-30 NOTE — Patient Instructions (Signed)
Asthma, Adult ° °Asthma is a long-term (chronic) condition in which the airways get tight and narrow. The airways are the breathing passages that lead from the nose and mouth down into the lungs. A person with asthma will have times when symptoms get worse. These are called asthma attacks. They can cause coughing, whistling sounds when you breathe (wheezing), shortness of breath, and chest pain. They can make it hard to breathe. There is no cure for asthma, but medicines and lifestyle changes can help control it. °There are many things that can bring on an asthma attack or make asthma symptoms worse (triggers). Common triggers include: °· Mold. °· Dust. °· Cigarette smoke. °· Cockroaches. °· Things that can cause allergy symptoms (allergens). These include animal skin flakes (dander) and pollen from trees or grass. °· Things that pollute the air. These may include household cleaners, wood smoke, smog, or chemical odors. °· Cold air, weather changes, and wind. °· Crying or laughing hard. °· Stress. °· Certain medicines or drugs. °· Certain foods such as dried fruit, potato chips, and grape juice. °· Infections, such as a cold or the flu. °· Certain medical conditions or diseases. °· Exercise or tiring activities. °Asthma may be treated with medicines and by staying away from the things that cause asthma attacks. Types of medicines may include: °· Controller medicines. These help prevent asthma symptoms. They are usually taken every day. °· Fast-acting reliever or rescue medicines. These quickly relieve asthma symptoms. They are used as needed and provide short-term relief. °· Allergy medicines if your attacks are brought on by allergens. °· Medicines to help control the body's defense (immune) system. °Follow these instructions at home: °Avoiding triggers in your home °· Change your heating and air conditioning filter often. °· Limit your use of fireplaces and wood stoves. °· Get rid of pests (such as roaches and  mice) and their droppings. °· Throw away plants if you see mold on them. °· Clean your floors. Dust regularly. Use cleaning products that do not smell. °· Have someone vacuum when you are not home. Use a vacuum cleaner with a HEPA filter if possible. °· Replace carpet with wood, tile, or vinyl flooring. Carpet can trap animal skin flakes and dust. °· Use allergy-proof pillows, mattress covers, and box spring covers. °· Wash bed sheets and blankets every week in hot water. Dry them in a dryer. °· Keep your bedroom free of any triggers. °· Avoid pets and keep windows closed when things that cause allergy symptoms are in the air. °· Use blankets that are made of polyester or cotton. °· Clean bathrooms and kitchens with bleach. If possible, have someone repaint the walls in these rooms with mold-resistant paint. Keep out of the rooms that are being cleaned and painted. °· Wash your hands often with soap and water. If soap and water are not available, use hand sanitizer. °· Do not allow anyone to smoke in your home. °General instructions °· Take over-the-counter and prescription medicines only as told by your doctor. °? Talk with your doctor if you have questions about how or when to take your medicines. °? Make note if you need to use your medicines more often than usual. °· Do not use any products that contain nicotine or tobacco, such as cigarettes and e-cigarettes. If you need help quitting, ask your doctor. °· Stay away from secondhand smoke. °· Avoid doing things outdoors when allergen counts are high and when air quality is low. °· Wear a ski mask   when doing outdoor activities in the winter. The mask should cover your nose and mouth. Exercise indoors on cold days if you can. °· Warm up before you exercise. Take time to cool down after exercise. °· Use a peak flow meter as told by your doctor. A peak flow meter is a tool that measures how well the lungs are working. °· Keep track of the peak flow meter's readings.  Write them down. °· Follow your asthma action plan. This is a written plan for taking care of your asthma and treating your attacks. °· Make sure you get all the shots (vaccines) that your doctor recommends. Ask your doctor about a flu shot and a pneumonia shot. °· Keep all follow-up visits as told by your doctor. This is important. °Contact a doctor if: °· You have wheezing, shortness of breath, or a cough even while taking medicine to prevent attacks. °· The mucus you cough up (sputum) is thicker than usual. °· The mucus you cough up changes from clear or white to yellow, green, gray, or bloody. °· You have problems from the medicine you are taking, such as: °? A rash. °? Itching. °? Swelling. °? Trouble breathing. °· You need reliever medicines more than 2-3 times a week. °· Your peak flow reading is still at 50-79% of your personal best after following the action plan for 1 hour. °· You have a fever. °Get help right away if: °· You seem to be worse and are not responding to medicine during an asthma attack. °· You are short of breath even at rest. °· You get short of breath when doing very little activity. °· You have trouble eating, drinking, or talking. °· You have chest pain or tightness. °· You have a fast heartbeat. °· Your lips or fingernails start to turn blue. °· You are light-headed or dizzy, or you faint. °· Your peak flow is less than 50% of your personal best. °· You feel too tired to breathe normally. °Summary °· Asthma is a long-term (chronic) condition in which the airways get tight and narrow. An asthma attack can make it hard to breathe. °· Asthma cannot be cured, but medicines and lifestyle changes can help control it. °· Make sure you understand how to avoid triggers and how and when to use your medicines. °This information is not intended to replace advice given to you by your health care provider. Make sure you discuss any questions you have with your health care provider. °Document  Released: 06/13/2007 Document Revised: 02/27/2018 Document Reviewed: 01/30/2016 °[employer]vier Patient Education © 2020 [employer] ° °

## 2018-11-10 ENCOUNTER — Telehealth: Payer: Self-pay

## 2018-11-10 ENCOUNTER — Encounter: Payer: Self-pay | Admitting: Internal Medicine

## 2018-11-10 ENCOUNTER — Other Ambulatory Visit: Payer: Self-pay | Admitting: Internal Medicine

## 2018-11-10 ENCOUNTER — Other Ambulatory Visit: Payer: Self-pay

## 2018-11-10 ENCOUNTER — Ambulatory Visit (INDEPENDENT_AMBULATORY_CARE_PROVIDER_SITE_OTHER): Payer: Medicare Other | Admitting: Internal Medicine

## 2018-11-10 DIAGNOSIS — K219 Gastro-esophageal reflux disease without esophagitis: Secondary | ICD-10-CM | POA: Diagnosis not present

## 2018-11-10 DIAGNOSIS — E1165 Type 2 diabetes mellitus with hyperglycemia: Secondary | ICD-10-CM

## 2018-11-10 DIAGNOSIS — J309 Allergic rhinitis, unspecified: Secondary | ICD-10-CM | POA: Diagnosis not present

## 2018-11-10 DIAGNOSIS — J4520 Mild intermittent asthma, uncomplicated: Secondary | ICD-10-CM

## 2018-11-10 DIAGNOSIS — D518 Other vitamin B12 deficiency anemias: Secondary | ICD-10-CM | POA: Diagnosis not present

## 2018-11-10 DIAGNOSIS — Z0001 Encounter for general adult medical examination with abnormal findings: Secondary | ICD-10-CM | POA: Diagnosis not present

## 2018-11-10 DIAGNOSIS — M8588 Other specified disorders of bone density and structure, other site: Secondary | ICD-10-CM

## 2018-11-10 DIAGNOSIS — R300 Dysuria: Secondary | ICD-10-CM

## 2018-11-10 DIAGNOSIS — E119 Type 2 diabetes mellitus without complications: Secondary | ICD-10-CM

## 2018-11-10 LAB — POCT GLYCOSYLATED HEMOGLOBIN (HGB A1C): Hemoglobin A1C: 6.8 % — AB (ref 4.0–5.6)

## 2018-11-10 MED ORDER — RISEDRONATE SODIUM 30 MG PO TABS: 30.0000 mg | ORAL_TABLET | ORAL | 3 refills | Status: DC

## 2018-11-10 MED ORDER — RISEDRONATE SODIUM 35 MG PO TABS: 35.0000 mg | ORAL_TABLET | ORAL | 3 refills | Status: DC

## 2018-11-10 NOTE — Progress Notes (Signed)
[facility] [place], [place] [address]  Internal MEDICINE  Office Visit Note  Patient Name: Cassidy Parsons  [id number]  [id number]  Date of Service: 11/13/2018  Chief Complaint  Patient presents with  . Annual Exam  . Diabetes  . Gastroesophageal Reflux    HPI Pt is here for routine health maintenance examination. Pt is feeling well at her baseline. Taking care of her husband with worsening Parkinsonism, does not have help at home and cannot leave him alone. Asthma is under good control. Jerrye Bushy is stable. Recent BMD showed more bone loss than before. She is on calcium and Vit D supplement. Struggles with er weight but tries to watch her diet   Current Medication: Outpatient Encounter Medications as of 11/10/2018  Medication Sig  . aspirin EC 81 MG tablet Take 81 mg by mouth daily.  Marland Kitchen azelastine (ASTELIN) 0.1 % nasal spray Place 1 spray into both nostrils daily. Use in each nostril as directed  . Calcium Carb-Cholecalciferol (CALCIUM 600 + D) 600-200 MG-UNIT TABS Take 1 tablet by mouth daily.  . cetirizine (ZYRTEC) 10 MG tablet Take 10 mg by mouth daily.  Marland Kitchen docusate sodium (COLACE) 100 MG capsule Take 200 mg by mouth at bedtime.  Marland Kitchen esomeprazole (NEXIUM) 40 MG capsule Take 40 mg by mouth daily at 12 noon.  . fluocinonide (LIDEX) 0.05 % external solution Apply 1 application topically 2 (two) times daily as needed (for infection).  . fluticasone (FLONASE) 50 MCG/ACT nasal spray Place 1 spray into both nostrils at bedtime.  . Fluticasone-Salmeterol (ADVAIR) 250-50 MCG/DOSE AEPB Inhale 1 puff into the lungs 2 (two) times daily.  . montelukast (SINGULAIR) 10 MG tablet TAKE ONE TABLET BY MOUTH AT BEDTIME.  . Multiple Vitamins-Minerals (MULTIVITAMIN ADULT PO) Take 1 tablet by mouth daily.  Marland Kitchen oxybutynin (DITROPAN) 5 MG tablet TAKE (1) TABLET BY MOUTH EVERY DAY  . potassium citrate (UROCIT-K) 10 MEQ (1080 MG) SR tablet TAKE (1) TABLET BY MOUTH TWICE DAILY  .  [DISCONTINUED] phenazopyridine (PYRIDIUM) 200 MG tablet Take 1 tablet (200 mg total) by mouth 3 (three) times daily as needed for pain. (Patient not taking: Reported on 11/10/2018)  . [DISCONTINUED] risedronate (ACTONEL) 30 MG tablet Take 1 tablet (30 mg total) by mouth every 7 (seven) days. with water on empty stomach, nothing by mouth or lie down for next 30 minutes.   No facility-administered encounter medications on file as of 11/10/2018.     Surgical History: Past Surgical History:  Procedure Laterality Date  . BRAIN SURGERY    . BREAST SURGERY     biopsy right breast  . CRANIOTOMY N/A 02/10/2015   Procedure: Removal of cranial plate with scalp wound revision;  Surgeon: Earnie Larsson, MD;  Location: [facility];  Service: Neurosurgery;  Laterality: N/A;  . DIAGNOSTIC LAPAROSCOPY     panniculectomy  . TONSILLECTOMY      Medical History: Past Medical History:  Diagnosis Date  . Asthma   . Brain tumor (benign) ([place])    right side  . Diabetes mellitus without complication (HCC)    borderline  . Early cataracts, bilateral   . Environmental and seasonal allergies   . GERD (gastroesophageal reflux disease)   . Hypertension    PMH:  . On home oxygen therapy    at night only  . Seizures ([place])    haven't had a seizure since brain surgery in 2001    Family History: Family History  Problem Relation Age of Onset  .  Cancer Mother   . Heart failure Father   . Stroke Sister   . Diabetes Other     Review of Systems  Constitutional: Negative for chills, diaphoresis and fatigue.  HENT: Negative for ear pain, postnasal drip and sinus pressure.   Eyes: Negative for photophobia, discharge, redness, itching and visual disturbance.  Respiratory: Negative for cough, shortness of breath and wheezing.   Cardiovascular: Negative for chest pain, palpitations and leg swelling.  Gastrointestinal: Negative for abdominal pain, constipation, diarrhea, nausea and vomiting.  Genitourinary: Negative  for dysuria and flank pain.  Musculoskeletal: Negative for arthralgias, back pain, gait problem and neck pain.  Skin: Negative for color change.  Allergic/Immunologic: Negative for environmental allergies and food allergies.  Neurological: Negative for dizziness and headaches.  Hematological: Does not bruise/bleed easily.  Psychiatric/Behavioral: Negative for agitation, behavioral problems (depression) and hallucinations.    Vital Signs: BP 130/80   Pulse 73   Temp (!) 97.4 F (36.3 C)   Resp 16   Ht 5\' 1"  (1.549 m)   Wt 254 lb (115.2 kg)   SpO2 97%   BMI 47.99 kg/m    Physical Exam Constitutional:      General: She is not in acute distress.    Appearance: She is well-developed. She is not diaphoretic.  HENT:     Head: Normocephalic and atraumatic.     Mouth/Throat:     Pharynx: No oropharyngeal exudate.  Eyes:     Pupils: Pupils are equal, round, and reactive to light.  Neck:     Musculoskeletal: Normal range of motion and neck supple.     Thyroid: No thyromegaly.     Vascular: No JVD.     Trachea: No tracheal deviation.  Cardiovascular:     Rate and Rhythm: Normal rate and regular rhythm.     Heart sounds: Normal heart sounds. No murmur. No friction rub. No gallop.   Pulmonary:     Effort: Pulmonary effort is normal. No respiratory distress.     Breath sounds: No wheezing or rales.  Chest:     Chest wall: No tenderness.     Breasts:        Right: Normal.        Left: Normal.  Abdominal:     General: Bowel sounds are normal.     Palpations: Abdomen is soft.  Musculoskeletal: Normal range of motion.  Lymphadenopathy:     Cervical: No cervical adenopathy.  Skin:    General: Skin is warm and dry.  Neurological:     Mental Status: She is alert and oriented to person, place, and time.     Cranial Nerves: No cranial nerve deficit.  Psychiatric:        Behavior: Behavior normal.        Thought Content: Thought content normal.        Judgment: Judgment normal.     LABS: Recent Results (from the past 2160 hour(s))  Microalbumin, urine     Status: None   Collection Time: 11/10/18 10:20 AM  Result Value Ref Range   Microalbumin, Urine 7.8 Not Estab. ug/mL  UA/M w/rflx Culture, Routine     Status: None   Collection Time: 11/10/18 10:20 AM   Specimen: Urine   URINE  Result Value Ref Range   Specific Gravity, UA CANCELED     Comment: Quantity was not sufficient for analysis.  Result canceled by the ancillary.    pH, UA CANCELED     Comment: Test not performed  Result canceled by the ancillary.    Protein,UA CANCELED     Comment: Test not performed  Result canceled by the ancillary.    Glucose, UA CANCELED     Comment: Test not performed  Result canceled by the ancillary.    Ketones, UA CANCELED     Comment: Test not performed  Result canceled by the ancillary.   POCT HgB A1C     Status: Abnormal   Collection Time: 11/10/18 10:36 AM  Result Value Ref Range   Hemoglobin A1C 6.8 (A) 4.0 - 5.6 %   HbA1c POC (<> result, manual entry)     HbA1c, POC (prediabetic range)     HbA1c, POC (controlled diabetic range)    CBC with Differential/Platelet     Status: None   Collection Time: 11/10/18 11:41 AM  Result Value Ref Range   WBC 6.3 3.4 - 10.8 x10E3/uL   RBC 4.80 3.77 - 5.28 x10E6/uL   Hemoglobin 14.1 11.1 - 15.9 g/dL   Hematocrit 41.9 34.0 - 46.6 %   MCV 87 79 - 97 fL   MCH 29.4 26.6 - 33.0 pg   MCHC 33.7 31.5 - 35.7 g/dL   RDW 13.8 11.7 - 15.4 %   Platelets 283 150 - 450 x10E3/uL   Neutrophils 63 Not Estab. %   Lymphs 24 Not Estab. %   Monocytes 9 Not Estab. %   Eos 3 Not Estab. %   Basos 1 Not Estab. %   Neutrophils Absolute 3.9 1.4 - 7.0 x10E3/uL   Lymphocytes Absolute 1.5 0.7 - 3.1 x10E3/uL   Monocytes Absolute 0.6 0.1 - 0.9 x10E3/uL   EOS (ABSOLUTE) 0.2 0.0 - 0.4 x10E3/uL   Basophils Absolute 0.1 0.0 - 0.2 x10E3/uL   Immature Granulocytes 0 Not Estab. %   Immature Grans (Abs) 0.0 0.0 - 0.1 x10E3/uL  Lipid Panel  With LDL/HDL Ratio     Status: None   Collection Time: 11/10/18 11:41 AM  Result Value Ref Range   Cholesterol, Total 155 100 - 199 mg/dL   Triglycerides 110 0 - 149 mg/dL   HDL 54 >39 mg/dL   VLDL Cholesterol Cal 20 5 - 40 mg/dL   LDL Chol Calc (NIH) 81 0 - 99 mg/dL   LDL/HDL Ratio 1.5 0.0 - 3.2 ratio    Comment:                                     LDL/HDL Ratio                                             Men  Women                               1/2 Avg.Risk  1.0    1.5                                   Avg.Risk  3.6    3.2                                2X Avg.Risk  6.2  5.0                                3X Avg.Risk  8.0    6.1   TSH     Status: None   Collection Time: 11/10/18 11:41 AM  Result Value Ref Range   TSH 1.010 0.450 - 4.500 uIU/mL  T4, free     Status: None   Collection Time: 11/10/18 11:41 AM  Result Value Ref Range   Free T4 1.49 0.82 - 1.77 ng/dL  Comprehensive metabolic panel     Status: Abnormal   Collection Time: 11/10/18 11:41 AM  Result Value Ref Range   Glucose 113 (H) 65 - 99 mg/dL   BUN 16 8 - 27 mg/dL   Creatinine, Ser 0.76 0.57 - 1.00 mg/dL   GFR calc non Af Amer 79 >59 mL/min/1.73   GFR calc Af Amer 91 >59 mL/min/1.73   BUN/Creatinine Ratio 21 12 - 28   Sodium 141 134 - 144 mmol/L   Potassium 4.4 3.5 - 5.2 mmol/L   Chloride 102 96 - 106 mmol/L   CO2 25 20 - 29 mmol/L   Calcium 9.3 8.7 - 10.3 mg/dL   Total Protein 6.8 6.0 - 8.5 g/dL   Albumin 3.6 (L) 3.7 - 4.7 g/dL   Globulin, Total 3.2 1.5 - 4.5 g/dL   Albumin/Globulin Ratio 1.1 (L) 1.2 - 2.2   Bilirubin Total 0.4 0.0 - 1.2 mg/dL   Alkaline Phosphatase 93 39 - 117 IU/L   AST 20 0 - 40 IU/L   ALT 16 0 - 32 IU/L  B12 and Folate Panel     Status: None   Collection Time: 11/10/18 11:41 AM  Result Value Ref Range   Vitamin B-12 411 232 - 1,245 pg/mL   Folate >20.0 >3.0 ng/mL    Comment: A serum folate concentration of less than 3.1 ng/mL is considered to represent clinical deficiency.     Assessment/Plan: 1. Encounter for general adult medical examination with abnormal findings - She is current on her preventive health maintenance, mammogram will be due in Feb 2021  2. Osteopenia of lumbar spine - Continue cal /vit D supplement, add actonel, monitor GI side effects   3. Diet-controlled diabetes mellitus ([place]) - ADA diet  - POCT HgB A1C   4. Gastroesophageal reflux disease without esophagitis - Controlled   5. Asthma in adult, mild intermittent, uncomplicated - No recent flare up, controlled   6. Allergic rhinitis, unspecified seasonality, unspecified trigger - Stable  7. Vitamin B12 deficiency (dietary) anemia - H/O deficiency will monitor   8. Dysuria - UA/M w/rflx Culture, Routine - Vitamin D 1,25 dihydroxy  General Counseling: Shericka verbalizes understanding of the findings of todays visit and agrees with plan of treatment. I have discussed any further diagnostic evaluation that may be needed or ordered today. We also reviewed her medications today. she has been encouraged to call the office with any questions or concerns that should arise related to todays visit. Obesity Counseling: Risk Assessment: An assessment of behavioral risk factors was made today and includes lack of exercise sedentary lifestyle, lack of portion control and poor dietary habits.  Risk Modification Advice: She was counseled on portion control guidelines. Restricting daily caloric intake to. . The detrimental long term effects of obesity on her health and ongoing poor compliance was also discussed with the patient.  Orders Placed This Encounter  Procedures  . Microalbumin,  urine  . UA/M w/rflx Culture, Routine  . CBC with Differential/Platelet  . Lipid Panel With LDL/HDL Ratio  . TSH  . T4, free  . Comprehensive metabolic panel  . Vitamin D 1,25 dihydroxy  . B12 and Folate Panel  . POCT HgB A1C   Meds ordered this encounter  Medications  . DISCONTD: risedronate (ACTONEL)  30 MG tablet    Sig: Take 1 tablet (30 mg total) by mouth every 7 (seven) days. with water on empty stomach, nothing by mouth or lie down for next 30 minutes.    Dispense:  4 tablet    Refill:  3   Time spent: 30Minutes  Lavera Guise, MD  Internal Medicine

## 2018-11-10 NOTE — Telephone Encounter (Signed)
Patient to call back to confirm appointment scheduled @ [place] 03/09/2019 @ 11:10am.

## 2018-11-11 LAB — COMPREHENSIVE METABOLIC PANEL
ALT: 16 IU/L (ref 0–32)
AST: 20 IU/L (ref 0–40)
Albumin/Globulin Ratio: 1.1 — ABNORMAL LOW (ref 1.2–2.2)
Albumin: 3.6 g/dL — ABNORMAL LOW (ref 3.7–4.7)
Alkaline Phosphatase: 93 IU/L (ref 39–117)
BUN/Creatinine Ratio: 21 (ref 12–28)
BUN: 16 mg/dL (ref 8–27)
Bilirubin Total: 0.4 mg/dL (ref 0.0–1.2)
CO2: 25 mmol/L (ref 20–29)
Calcium: 9.3 mg/dL (ref 8.7–10.3)
Chloride: 102 mmol/L (ref 96–106)
Creatinine, Ser: 0.76 mg/dL (ref 0.57–1.00)
GFR calc Af Amer: 91 mL/min/{1.73_m2} (ref >59)
GFR calc non Af Amer: 79 mL/min/{1.73_m2} (ref >59)
Globulin, Total: 3.2 g/dL (ref 1.5–4.5)
Glucose: 113 mg/dL — ABNORMAL HIGH (ref 65–99)
Potassium: 4.4 mmol/L (ref 3.5–5.2)
Sodium: 141 mmol/L (ref 134–144)
Total Protein: 6.8 g/dL (ref 6.0–8.5)

## 2018-11-11 LAB — CBC WITH DIFFERENTIAL/PLATELET
Basophils Absolute: 0.1 10*3/uL (ref 0.0–0.2)
Basos: 1 %
EOS (ABSOLUTE): 0.2 10*3/uL (ref 0.0–0.4)
Eos: 3 %
Hematocrit: 41.9 % (ref 34.0–46.6)
Hemoglobin: 14.1 g/dL (ref 11.1–15.9)
Immature Grans (Abs): 0 10*3/uL (ref 0.0–0.1)
Immature Granulocytes: 0 %
Lymphocytes Absolute: 1.5 10*3/uL (ref 0.7–3.1)
Lymphs: 24 %
MCH: 29.4 pg (ref 26.6–33.0)
MCHC: 33.7 g/dL (ref 31.5–35.7)
MCV: 87 fL (ref 79–97)
Monocytes Absolute: 0.6 10*3/uL (ref 0.1–0.9)
Monocytes: 9 %
Neutrophils Absolute: 3.9 10*3/uL (ref 1.4–7.0)
Neutrophils: 63 %
Platelets: 283 10*3/uL (ref 150–450)
RBC: 4.8 x10E6/uL (ref 3.77–5.28)
RDW: 13.8 % (ref 11.7–15.4)
WBC: 6.3 10*3/uL (ref 3.4–10.8)

## 2018-11-11 LAB — B12 AND FOLATE PANEL
Folate: >20 ng/mL (ref >3.0)
Vitamin B-12: 411 pg/mL (ref 232–1245)

## 2018-11-11 LAB — LIPID PANEL WITH LDL/HDL RATIO
Cholesterol, Total: 155 mg/dL (ref 100–199)
HDL: 54 mg/dL (ref >39)
LDL Chol Calc (NIH): 81 mg/dL (ref 0–99)
LDL/HDL Ratio: 1.5 ratio (ref 0.0–3.2)
Triglycerides: 110 mg/dL (ref 0–149)
VLDL Cholesterol Cal: 20 mg/dL (ref 5–40)

## 2018-11-11 LAB — TSH: TSH: 1.01 u[IU]/mL (ref 0.450–4.500)

## 2018-11-11 LAB — T4, FREE: Free T4: 1.49 ng/dL (ref 0.82–1.77)

## 2018-11-12 LAB — UA/M W/RFLX CULTURE, ROUTINE

## 2018-11-12 LAB — MICROALBUMIN, URINE: Microalbumin, Urine: 7.8 ug/mL

## 2018-11-19 ENCOUNTER — Other Ambulatory Visit: Payer: Self-pay

## 2018-11-19 MED ORDER — FLUTICASONE-SALMETEROL 250-50 MCG/DOSE IN AEPB: 1.0000 | INHALATION_SPRAY | Freq: Two times a day (BID) | RESPIRATORY_TRACT | 4 refills | Status: DC

## 2018-12-09 ENCOUNTER — Other Ambulatory Visit: Payer: Self-pay | Admitting: Adult Health

## 2019-01-17 ENCOUNTER — Other Ambulatory Visit: Payer: Self-pay | Admitting: Adult Health

## 2019-02-12 ENCOUNTER — Telehealth: Payer: Self-pay

## 2019-02-12 NOTE — Telephone Encounter (Signed)
Rescheduled virtual visit on 02/16/2019 to 02/23/2019. klh

## 2019-02-16 ENCOUNTER — Ambulatory Visit: Payer: Medicare Other | Admitting: Adult Health

## 2019-02-19 ENCOUNTER — Telehealth: Payer: Self-pay

## 2019-02-19 NOTE — Telephone Encounter (Signed)
Called lmom informing patient of appointment on 02/23/2019. klh

## 2019-02-20 ENCOUNTER — Telehealth: Payer: Self-pay

## 2019-02-20 NOTE — Telephone Encounter (Signed)
Confirmed telephone visit on 02/24/2019. klh

## 2019-02-23 ENCOUNTER — Ambulatory Visit: Payer: Medicare Other | Admitting: Adult Health

## 2019-02-24 ENCOUNTER — Ambulatory Visit: Payer: Medicare Other | Admitting: Adult Health

## 2019-03-02 ENCOUNTER — Ambulatory Visit: Payer: Medicare Other | Attending: Internal Medicine

## 2019-03-02 DIAGNOSIS — Z23 Encounter for immunization: Secondary | ICD-10-CM | POA: Insufficient documentation

## 2019-03-02 NOTE — Progress Notes (Signed)
   Covid-19 Vaccination Clinic  Name:  Cassidy Parsons    MRN: [id number] DOB: 10/06/1945  03/02/2019  Cassidy Parsons was observed post Covid-19 immunization for 15 minutes without incidence. She was provided with Vaccine Information Sheet and instruction to access the V-Safe system.   Cassidy Parsons was instructed to call 911 with any severe reactions post vaccine: Marland Kitchen Difficulty breathing  . Swelling of your face and throat  . A fast heartbeat  . A bad rash all over your body  . Dizziness and weakness    Immunizations Administered    Name Date Dose VIS Date Route   Moderna COVID-19 Vaccine 03/02/2019 10:18 AM 0.5 mL 12/09/2018 Intramuscular   Manufacturer: Moderna   Lot: [id number]   [id number]

## 2019-03-31 ENCOUNTER — Ambulatory Visit: Payer: Medicare Other | Attending: Internal Medicine

## 2019-03-31 DIAGNOSIS — Z23 Encounter for immunization: Secondary | ICD-10-CM

## 2019-03-31 NOTE — Progress Notes (Signed)
   Covid-19 Vaccination Clinic  Name:  SWETA MAIDA    MRN: [id number] DOB: 21-Jul-1945  03/31/2019  Ms. Vassar was observed post Covid-19 immunization for 15 minutes without incident. She was provided with Vaccine Information Sheet and instruction to access the V-Safe system.   Ms. Clearman was instructed to call 911 with any severe reactions post vaccine: Marland Kitchen Difficulty breathing  . Swelling of face and throat  . A fast heartbeat  . A bad rash all over body  . Dizziness and weakness   Immunizations Administered    Name Date Dose VIS Date Route   Moderna COVID-19 Vaccine 03/31/2019 10:28 AM 0.5 mL 12/09/2018 Intramuscular   Manufacturer: Levan Hurst   LotUT:[id number]   [id number]

## 2019-04-06 ENCOUNTER — Other Ambulatory Visit: Payer: Self-pay | Admitting: Adult Health

## 2019-04-06 DIAGNOSIS — J309 Allergic rhinitis, unspecified: Secondary | ICD-10-CM

## 2019-04-15 ENCOUNTER — Telehealth: Payer: Self-pay

## 2019-04-15 NOTE — Telephone Encounter (Signed)
Called lmom informing patient of appointment on 04/17/2019. klh

## 2019-04-17 ENCOUNTER — Ambulatory Visit (INDEPENDENT_AMBULATORY_CARE_PROVIDER_SITE_OTHER): Payer: Medicare Other | Admitting: Adult Health

## 2019-04-17 ENCOUNTER — Encounter: Payer: Self-pay | Admitting: Adult Health

## 2019-04-17 VITALS — BP 130/80 | HR 70 | Temp 97.3°F | Resp 16 | Ht 61.0 in | Wt 253.0 lb

## 2019-04-17 DIAGNOSIS — I10 Essential (primary) hypertension: Secondary | ICD-10-CM

## 2019-04-17 DIAGNOSIS — E1165 Type 2 diabetes mellitus with hyperglycemia: Secondary | ICD-10-CM

## 2019-04-17 DIAGNOSIS — J309 Allergic rhinitis, unspecified: Secondary | ICD-10-CM

## 2019-04-17 DIAGNOSIS — K219 Gastro-esophageal reflux disease without esophagitis: Secondary | ICD-10-CM | POA: Diagnosis not present

## 2019-04-17 DIAGNOSIS — J4520 Mild intermittent asthma, uncomplicated: Secondary | ICD-10-CM

## 2019-04-17 LAB — POCT GLYCOSYLATED HEMOGLOBIN (HGB A1C): Hemoglobin A1C: 7.1 % — AB (ref 4.0–5.6)

## 2019-04-17 NOTE — Progress Notes (Signed)
[facility] [place], [place] [address]  Internal MEDICINE  Office Visit Note  Patient Name: Cassidy Parsons  [id number]  [id number]  Date of Service: 04/17/2019  Chief Complaint  Patient presents with  . Hypertension  . Gastroesophageal Reflux  . Diabetes  . Asthma    dry cough    HPI  Pt is here for follow up.  She has a history of DM, HTN, GERD and asthma.  Her DM remains stable with an A1C of 7.1.  She denies any issues at this time.  Her blood pressure and GERD are well controlled.  She does report some intermittent dry coughing, due to her asthma.  It seems to be worse recently due to pollen.    Current Medication: Outpatient Encounter Medications as of 04/17/2019  Medication Sig  . aspirin EC 81 MG tablet Take 81 mg by mouth daily.  Marland Kitchen azelastine (ASTELIN) 0.1 % nasal spray PLACE 1 SPRAY INTO EACH NOSTRIL DAILY ASDIRECTED.  . Calcium Carb-Cholecalciferol (CALCIUM 600 + D) 600-200 MG-UNIT TABS Take 1 tablet by mouth daily.  . cetirizine (ZYRTEC) 10 MG tablet Take 10 mg by mouth daily.  Marland Kitchen docusate sodium (COLACE) 100 MG capsule Take 200 mg by mouth at bedtime.  Marland Kitchen esomeprazole (NEXIUM) 40 MG capsule Take 40 mg by mouth daily at 12 noon.  . fluocinonide (LIDEX) 0.05 % external solution APPLY 1 APPLICATION TOPICALLY TWICE DAILY AS NEEDED FOR INFECTION  . fluticasone (FLONASE) 50 MCG/ACT nasal spray PLACE 1 SPRAY INTO BOTH NOSTRILS AT BEDTIME  . Fluticasone-Salmeterol (ADVAIR) 250-50 MCG/DOSE AEPB Inhale 1 puff into the lungs 2 (two) times daily.  . montelukast (SINGULAIR) 10 MG tablet TAKE ONE TABLET BY MOUTH AT BEDTIME.  . Multiple Vitamins-Minerals (MULTIVITAMIN ADULT PO) Take 1 tablet by mouth daily.  Marland Kitchen oxybutynin (DITROPAN) 5 MG tablet TAKE (1) TABLET BY MOUTH EVERY DAY  . potassium citrate (UROCIT-K) 10 MEQ (1080 MG) SR tablet TAKE (1) TABLET BY MOUTH TWICE DAILY  . risedronate (ACTONEL) 35 MG tablet Take 1 tablet (35 mg total) by mouth every 7  (seven) days. with water on empty stomach, nothing by mouth or lie down for next 30 minutes.   No facility-administered encounter medications on file as of 04/17/2019.    Surgical History: Past Surgical History:  Procedure Laterality Date  . BRAIN SURGERY    . BREAST SURGERY     biopsy right breast  . CRANIOTOMY N/A 02/10/2015   Procedure: Removal of cranial plate with scalp wound revision;  Surgeon: Earnie Larsson, MD;  Location: [facility];  Service: Neurosurgery;  Laterality: N/A;  . DIAGNOSTIC LAPAROSCOPY     panniculectomy  . TONSILLECTOMY      Medical History: Past Medical History:  Diagnosis Date  . Asthma   . Brain tumor (benign) ([place])    right side  . Diabetes mellitus without complication (HCC)    borderline  . Early cataracts, bilateral   . Environmental and seasonal allergies   . GERD (gastroesophageal reflux disease)   . Hypertension    PMH:  . On home oxygen therapy    at night only  . Seizures ([place])    haven't had a seizure since brain surgery in 2001    Family History: Family History  Problem Relation Age of Onset  . Cancer Mother   . Heart failure Father   . Stroke Sister   . Diabetes Other     Social History   Socioeconomic History  .  Marital status: Married    Spouse name: Not on file  . Number of children: Not on file  . Years of education: Not on file  . Highest education level: Not on file  Occupational History  . Not on file  Tobacco Use  . Smoking status: Former Smoker    Types: Cigarettes  . Smokeless tobacco: Never Used  . Tobacco comment: quit smoking cigarettes 34 years ago  Substance and Sexual Activity  . Alcohol use: No  . Drug use: No  . Sexual activity: Not on file  Other Topics Concern  . Not on file  Social History Narrative  . Not on file   Social Determinants of Health   Financial Resource Strain:   . Difficulty of Paying Living Expenses:   Food Insecurity:   . Worried About Charity fundraiser in the Last Year:    . Arboriculturist in the Last Year:   Transportation Needs:   . Film/video editor (Medical):   Marland Kitchen Lack of Transportation (Non-Medical):   Physical Activity:   . Days of Exercise per Week:   . Minutes of Exercise per Session:   Stress:   . Feeling of Stress :   Social Connections:   . Frequency of Communication with Friends and Family:   . Frequency of Social Gatherings with Friends and Family:   . Attends Religious Services:   . Active Member of Clubs or Organizations:   . Attends Archivist Meetings:   Marland Kitchen Marital Status:   Intimate Partner Violence:   . Fear of Current or Ex-Partner:   . Emotionally Abused:   Marland Kitchen Physically Abused:   . Sexually Abused:       Review of Systems  Constitutional: Negative for chills, fatigue and unexpected weight change.  HENT: Negative for congestion, rhinorrhea, sneezing and sore throat.   Eyes: Negative for photophobia, pain and redness.  Respiratory: Negative for cough, chest tightness and shortness of breath.   Cardiovascular: Negative for chest pain and palpitations.  Gastrointestinal: Negative for abdominal pain, constipation, diarrhea, nausea and vomiting.  Endocrine: Negative.   Genitourinary: Negative for dysuria and frequency.  Musculoskeletal: Negative for arthralgias, back pain, joint swelling and neck pain.  Skin: Negative for rash.  Allergic/Immunologic: Negative.   Neurological: Negative for tremors and numbness.  Hematological: Negative for adenopathy. Does not bruise/bleed easily.  Psychiatric/Behavioral: Negative for behavioral problems and sleep disturbance. The patient is not nervous/anxious.     Vital Signs: BP 130/80   Pulse 70   Temp (!) 97.3 F (36.3 C)   Resp 16   Ht 5\' 1"  (1.549 m)   Wt 253 lb (114.8 kg)   SpO2 95%   BMI 47.80 kg/m    Physical Exam Vitals and nursing note reviewed.  Constitutional:      General: She is not in acute distress.    Appearance: She is well-developed. She is  not diaphoretic.  HENT:     Head: Normocephalic and atraumatic.     Mouth/Throat:     Pharynx: No oropharyngeal exudate.  Eyes:     Pupils: Pupils are equal, round, and reactive to light.  Neck:     Thyroid: No thyromegaly.     Vascular: No JVD.     Trachea: No tracheal deviation.  Cardiovascular:     Rate and Rhythm: Normal rate and regular rhythm.     Heart sounds: Normal heart sounds. No murmur. No friction rub. No gallop.   Pulmonary:  Effort: Pulmonary effort is normal. No respiratory distress.     Breath sounds: Normal breath sounds. No wheezing or rales.  Chest:     Chest wall: No tenderness.  Abdominal:     Palpations: Abdomen is soft.     Tenderness: There is no abdominal tenderness. There is no guarding.  Musculoskeletal:        General: Normal range of motion.     Cervical back: Normal range of motion and neck supple.  Lymphadenopathy:     Cervical: No cervical adenopathy.  Skin:    General: Skin is warm and dry.  Neurological:     Mental Status: She is alert and oriented to person, place, and time.     Cranial Nerves: No cranial nerve deficit.  Psychiatric:        Behavior: Behavior normal.        Thought Content: Thought content normal.        Judgment: Judgment normal.    Assessment/Plan: 1. Type 2 diabetes mellitus with hyperglycemia, unspecified whether long term insulin use (HCC) A1C slightly elevated she is just now getting back out of the house from her husbands death, and she is starting back her gym routine. Will follow up in 3 months.  - POCT HgB A1C  2. Essential hypertension Stable, continue present management.   3. Gastroesophageal reflux disease without esophagitis Controlled, continue present therapy.   4. Asthma in adult, mild intermittent, uncomplicated Has some dry cough, she is connecting this to pollen.  Told her to return to clinic if no improvement.   5. Allergic rhinitis, unspecified seasonality, unspecified trigger Continue,  allergy medications as directed.   General Counseling: dung dewhirst understanding of the findings of todays visit and agrees with plan of treatment. I have discussed any further diagnostic evaluation that may be needed or ordered today. We also reviewed her medications today. she has been encouraged to call the office with any questions or concerns that should arise related to todays visit.    Orders Placed This Encounter  Procedures  . POCT HgB A1C    No orders of the defined types were placed in this encounter.   Time spent: 30  Minutes   This patient was seen by Orson Gear AGNP-C in Collaboration with Dr Lavera Guise as a part of collaborative care agreement     Kendell Bane AGNP-C Internal medicine

## 2019-05-04 ENCOUNTER — Other Ambulatory Visit: Payer: Self-pay | Admitting: Adult Health

## 2019-05-04 ENCOUNTER — Other Ambulatory Visit: Payer: Self-pay

## 2019-05-04 MED ORDER — POTASSIUM CITRATE ER 10 MEQ (1080 MG) PO TBCR: EXTENDED_RELEASE_TABLET | ORAL | 1 refills | Status: DC

## 2019-05-04 MED ORDER — OXYBUTYNIN CHLORIDE 5 MG PO TABS: ORAL_TABLET | ORAL | 1 refills | Status: DC

## 2019-05-12 DIAGNOSIS — Z9289 Personal history of other medical treatment: Secondary | ICD-10-CM | POA: Diagnosis not present

## 2019-05-12 DIAGNOSIS — Z1231 Encounter for screening mammogram for malignant neoplasm of breast: Secondary | ICD-10-CM | POA: Diagnosis not present

## 2019-06-27 ENCOUNTER — Other Ambulatory Visit: Payer: Self-pay | Admitting: Adult Health

## 2019-06-27 DIAGNOSIS — J309 Allergic rhinitis, unspecified: Secondary | ICD-10-CM

## 2019-07-16 ENCOUNTER — Encounter: Payer: Self-pay | Admitting: Adult Health

## 2019-07-16 ENCOUNTER — Other Ambulatory Visit: Payer: Self-pay

## 2019-07-16 ENCOUNTER — Ambulatory Visit (INDEPENDENT_AMBULATORY_CARE_PROVIDER_SITE_OTHER): Payer: Medicare HMO | Admitting: Adult Health

## 2019-07-16 VITALS — BP 158/90 | HR 80 | Temp 97.6°F | Resp 16 | Ht 61.0 in | Wt 247.6 lb

## 2019-07-16 DIAGNOSIS — E1165 Type 2 diabetes mellitus with hyperglycemia: Secondary | ICD-10-CM

## 2019-07-16 DIAGNOSIS — K219 Gastro-esophageal reflux disease without esophagitis: Secondary | ICD-10-CM

## 2019-07-16 DIAGNOSIS — N939 Abnormal uterine and vaginal bleeding, unspecified: Secondary | ICD-10-CM | POA: Diagnosis not present

## 2019-07-16 DIAGNOSIS — I10 Essential (primary) hypertension: Secondary | ICD-10-CM | POA: Diagnosis not present

## 2019-07-16 LAB — POCT GLYCOSYLATED HEMOGLOBIN (HGB A1C): Hemoglobin A1C: 7.4 % — AB (ref 4.0–5.6)

## 2019-07-16 NOTE — Progress Notes (Signed)
[facility] [place], [place] [address]  Internal MEDICINE  Office Visit Note  Patient Name: Cassidy Parsons  [id number]  [id number]  Date of Service: 07/16/2019  Chief Complaint  Patient presents with  . Follow-up  . Diabetes  . Gastroesophageal Reflux  . Hypertension  . Abdominal Pain    pain in lower stomach and bleeding     HPI  Pt is here for follow up on DM, GERD, and HTN.  Overall doing well.  Her A1C is up to 7.4, she is finally back in the gym and is working out again.  She is hopefull that she will get her A1C back down by next visit. Her GERD is controlled.  She does complain that yesterday she noticed some vaginal bleeding.  It was initially bright red, and then now is rusty brown colored.  She has been having some cramping pain in abd, and low back pain.  She reports that she has intermittently had light bleeding over the last several years, but this is different and heavier like a period.    Current Medication: Outpatient Encounter Medications as of 07/16/2019  Medication Sig  . aspirin EC 81 MG tablet Take 81 mg by mouth daily.  Marland Kitchen azelastine (ASTELIN) 0.1 % nasal spray PLACE 1 SPRAY INTO EACH NOSTRIL DAILY ASDIRECTED.  . Calcium Carb-Cholecalciferol (CALCIUM 600 + D) 600-200 MG-UNIT TABS Take 1 tablet by mouth daily.  . cetirizine (ZYRTEC) 10 MG tablet Take 10 mg by mouth daily.  Marland Kitchen docusate sodium (COLACE) 100 MG capsule Take 200 mg by mouth at bedtime.  Marland Kitchen esomeprazole (NEXIUM) 40 MG capsule Take 40 mg by mouth daily at 12 noon.  . fluocinonide (LIDEX) 0.05 % external solution APPLY 1 APPLICATION TOPICALLY TWICE DAILY AS NEEDED FOR INFECTION  . fluticasone (FLONASE) 50 MCG/ACT nasal spray PLACE 1 SPRAY INTO BOTH NOSTRILS AT BEDTIME  . Fluticasone-Salmeterol (ADVAIR) 250-50 MCG/DOSE AEPB Inhale 1 puff into the lungs 2 (two) times daily.  . montelukast (SINGULAIR) 10 MG tablet TAKE ONE TABLET BY MOUTH AT BEDTIME.  . Multiple Vitamins-Minerals  (MULTIVITAMIN ADULT PO) Take 1 tablet by mouth daily.  Marland Kitchen oxybutynin (DITROPAN) 5 MG tablet TAKE (1) TABLET BY MOUTH EVERY DAY  . potassium citrate (UROCIT-K) 10 MEQ (1080 MG) SR tablet TAKE (1) TABLET BY MOUTH TWICE DAILY  . risedronate (ACTONEL) 35 MG tablet Take 1 tablet (35 mg total) by mouth every 7 (seven) days. with water on empty stomach, nothing by mouth or lie down for next 30 minutes.   No facility-administered encounter medications on file as of 07/16/2019.    Surgical History: Past Surgical History:  Procedure Laterality Date  . BRAIN SURGERY    . BREAST SURGERY     biopsy right breast  . CRANIOTOMY N/A 02/10/2015   Procedure: Removal of cranial plate with scalp wound revision;  Surgeon: Earnie Larsson, MD;  Location: [facility];  Service: Neurosurgery;  Laterality: N/A;  . DIAGNOSTIC LAPAROSCOPY     panniculectomy  . TONSILLECTOMY      Medical History: Past Medical History:  Diagnosis Date  . Asthma   . Brain tumor (benign) ([place])    right side  . Diabetes mellitus without complication (HCC)    borderline  . Early cataracts, bilateral   . Environmental and seasonal allergies   . GERD (gastroesophageal reflux disease)   . Hypertension    PMH:  . On home oxygen therapy    at night only  . Seizures (  [place])    haven't had a seizure since brain surgery in 2001    Family History: Family History  Problem Relation Age of Onset  . Cancer Mother   . Heart failure Father   . Stroke Sister   . Diabetes Other     Social History   Socioeconomic History  . Marital status: Married    Spouse name: Not on file  . Number of children: Not on file  . Years of education: Not on file  . Highest education level: Not on file  Occupational History  . Not on file  Tobacco Use  . Smoking status: Former Smoker    Types: Cigarettes  . Smokeless tobacco: Never Used  . Tobacco comment: quit smoking cigarettes 34 years ago  Vaping Use  . Vaping Use: Never used  Substance and  Sexual Activity  . Alcohol use: No  . Drug use: No  . Sexual activity: Not on file  Other Topics Concern  . Not on file  Social History Narrative  . Not on file   Social Determinants of Health   Financial Resource Strain:   . Difficulty of Paying Living Expenses:   Food Insecurity:   . Worried About Charity fundraiser in the Last Year:   . Arboriculturist in the Last Year:   Transportation Needs:   . Film/video editor (Medical):   Marland Kitchen Lack of Transportation (Non-Medical):   Physical Activity:   . Days of Exercise per Week:   . Minutes of Exercise per Session:   Stress:   . Feeling of Stress :   Social Connections:   . Frequency of Communication with Friends and Family:   . Frequency of Social Gatherings with Friends and Family:   . Attends Religious Services:   . Active Member of Clubs or Organizations:   . Attends Archivist Meetings:   Marland Kitchen Marital Status:   Intimate Partner Violence:   . Fear of Current or Ex-Partner:   . Emotionally Abused:   Marland Kitchen Physically Abused:   . Sexually Abused:       Review of Systems  Constitutional: Negative for chills, fatigue and unexpected weight change.  HENT: Negative for congestion, rhinorrhea, sneezing and sore throat.   Eyes: Negative for photophobia, pain and redness.  Respiratory: Negative for cough, chest tightness and shortness of breath.   Cardiovascular: Negative for chest pain and palpitations.  Gastrointestinal: Negative for abdominal pain, constipation, diarrhea, nausea and vomiting.  Endocrine: Negative.   Genitourinary: Negative for dysuria and frequency.  Musculoskeletal: Negative for arthralgias, back pain, joint swelling and neck pain.  Skin: Negative for rash.  Allergic/Immunologic: Negative.   Neurological: Negative for tremors and numbness.  Hematological: Negative for adenopathy. Does not bruise/bleed easily.  Psychiatric/Behavioral: Negative for behavioral problems and sleep disturbance. The  patient is not nervous/anxious.     Vital Signs: BP (!) 158/90   Pulse 80   Temp 97.6 F (36.4 C)   Resp 16   Ht 5\' 1"  (1.549 m)   Wt 247 lb 9.6 oz (112.3 kg)   SpO2 95%   BMI 46.78 kg/m    Physical Exam Vitals and nursing note reviewed.  Constitutional:      General: She is not in acute distress.    Appearance: She is well-developed. She is not diaphoretic.  HENT:     Head: Normocephalic and atraumatic.     Mouth/Throat:     Pharynx: No oropharyngeal exudate.  Eyes:  Pupils: Pupils are equal, round, and reactive to light.  Neck:     Thyroid: No thyromegaly.     Vascular: No JVD.     Trachea: No tracheal deviation.  Cardiovascular:     Rate and Rhythm: Normal rate and regular rhythm.     Heart sounds: Normal heart sounds. No murmur heard.  No friction rub. No gallop.   Pulmonary:     Effort: Pulmonary effort is normal. No respiratory distress.     Breath sounds: Normal breath sounds. No wheezing or rales.  Chest:     Chest wall: No tenderness.  Abdominal:     Palpations: Abdomen is soft.     Tenderness: There is no abdominal tenderness. There is no guarding.  Genitourinary:    Vagina: Bleeding present.  Musculoskeletal:        General: Normal range of motion.     Cervical back: Normal range of motion and neck supple.  Lymphadenopathy:     Cervical: No cervical adenopathy.  Skin:    General: Skin is warm and dry.  Neurological:     Mental Status: She is alert and oriented to person, place, and time.     Cranial Nerves: No cranial nerve deficit.  Psychiatric:        Behavior: Behavior normal.        Thought Content: Thought content normal.        Judgment: Judgment normal.    Assessment/Plan: 1. Nonmenstrual vaginal bleeding See GYN for post menopausal bleeding for eval and possible endometrial biopsy.   - Ambulatory referral to Gynecology  2. Type 2 diabetes mellitus with hyperglycemia, unspecified whether long term insulin use (HCC) A1C slightly  increased at this time. - POCT HgB A1C  3. Essential hypertension Slightly elevated.  Continue to monitor.   4. Gastroesophageal reflux disease without esophagitis No issues, continue to monitor.   General Counseling: coriana angello understanding of the findings of todays visit and agrees with plan of treatment. I have discussed any further diagnostic evaluation that may be needed or ordered today. We also reviewed her medications today. she has been encouraged to call the office with any questions or concerns that should arise related to todays visit.    Orders Placed This Encounter  Procedures  . Ambulatory referral to Gynecology  . POCT HgB A1C    No orders of the defined types were placed in this encounter.   Time spent: 30 Minutes   This patient was seen by Orson Gear AGNP-C in Collaboration with Dr Lavera Guise as a part of collaborative care agreement     Kendell Bane AGNP-C Internal medicine

## 2019-07-17 ENCOUNTER — Telehealth: Payer: Self-pay | Admitting: Obstetrics & Gynecology

## 2019-07-17 NOTE — Telephone Encounter (Signed)
[facility] referring for needs assessment for post menopausal bleeding, possible endometrial biopsy.Called and left voicemail for patient to call back to be scheduled.

## 2019-08-03 ENCOUNTER — Encounter: Payer: Self-pay | Admitting: Obstetrics & Gynecology

## 2019-08-03 ENCOUNTER — Other Ambulatory Visit (HOSPITAL_COMMUNITY)
Admission: RE | Admit: 2019-08-03 | Discharge: 2019-08-03 | Disposition: A | Discharge status: Home / Self Care | Payer: Medicare HMO | Source: Ambulatory Visit | Attending: Obstetrics & Gynecology | Admitting: Obstetrics & Gynecology

## 2019-08-03 ENCOUNTER — Ambulatory Visit (INDEPENDENT_AMBULATORY_CARE_PROVIDER_SITE_OTHER): Payer: Medicare HMO | Admitting: Obstetrics & Gynecology

## 2019-08-03 ENCOUNTER — Other Ambulatory Visit: Payer: Self-pay

## 2019-08-03 VITALS — BP 130/80 | Ht 61.0 in | Wt 249.0 lb

## 2019-08-03 DIAGNOSIS — R1032 Left lower quadrant pain: Secondary | ICD-10-CM

## 2019-08-03 DIAGNOSIS — N950 Postmenopausal bleeding: Secondary | ICD-10-CM

## 2019-08-03 DIAGNOSIS — Z124 Encounter for screening for malignant neoplasm of cervix: Secondary | ICD-10-CM

## 2019-08-03 DIAGNOSIS — R102 Pelvic and perineal pain: Secondary | ICD-10-CM

## 2019-08-03 DIAGNOSIS — C541 Malignant neoplasm of endometrium: Secondary | ICD-10-CM | POA: Diagnosis not present

## 2019-08-03 MED ORDER — MELOXICAM 7.5 MG PO TABS
7.5000 mg | ORAL_TABLET | Freq: Every day | ORAL | 1 refills | Status: DC | PRN
Start: 2019-08-03 — End: 2020-05-09

## 2019-08-03 NOTE — Progress Notes (Signed)
Postmenopausal Bleeding Patient complains of vaginal bleeding. She has been menopausal for several years. Currently on no HRT and no blood thinners. Bleeding is described as spotting and has occurred for about two years now (attributed it to colitis fora while).  Then she had pain develop one month ago that led her to PCP visit and assessment, may be colitis related or may be other, here for eval. Other menopausal symptoms include: none. Workup to date: none.  Menstrual History: OB History    Gravida  2   Para  1   Term  1   Preterm      AB  1   Living  1     SAB      TAB      Ectopic      Multiple      Live Births              No LMP recorded. Patient is postmenopausal.     PMHx: She  has a past medical history of Asthma, Brain tumor (benign) ([place]), Diabetes mellitus without complication ([place]), Early cataracts, bilateral, Environmental and seasonal allergies, GERD (gastroesophageal reflux disease), Hypertension, On home oxygen therapy, and Seizures ([place]). Also,  has a past surgical history that includes Diagnostic laparoscopy; Tonsillectomy; Brain surgery; Breast surgery; and Craniotomy (N/A, 02/10/2015)., family history includes Cancer in her mother; Diabetes in an other family member; Heart failure in her father; Stroke in her sister.,  reports that she has quit smoking. Her smoking use included cigarettes. She has never used smokeless tobacco. She reports that she does not drink alcohol and does not use drugs.  She has a current medication list which includes the following prescription(s): aspirin ec, azelastine, calcium carb-cholecalciferol, cetirizine, docusate sodium, esomeprazole, fluocinonide, fluticasone, fluticasone-salmeterol, montelukast, multiple vitamin, oxybutynin, potassium citrate, risedronate, and meloxicam. Also, is allergic to mold extract  [trichophyton], roxanol [morphine], meperidine and related, other, propoxyphene, tape, and tramadol.  Review of  Systems  Constitutional: Positive for malaise/fatigue. Negative for chills and fever.  HENT: Negative for congestion, sinus pain and sore throat.   Eyes: Negative for blurred vision and pain.  Respiratory: Negative for cough and wheezing.   Cardiovascular: Negative for chest pain and leg swelling.  Gastrointestinal: Positive for diarrhea. Negative for abdominal pain, constipation, heartburn, nausea and vomiting.  Genitourinary: Negative for dysuria, frequency, hematuria and urgency.  Musculoskeletal: Negative for back pain, joint pain, myalgias and neck pain.  Skin: Negative for itching and rash.  Neurological: Negative for dizziness, tremors and weakness.  Endo/Heme/Allergies: Bruises/bleeds easily.  Psychiatric/Behavioral: Positive for depression. The patient is not nervous/anxious and does not have insomnia.     Objective: BP (!) 130/80   Ht 5\' 1"  (1.549 m)   Wt (!) 249 lb (112.9 kg)   BMI 47.05 kg/m  Physical Exam Constitutional:      General: She is not in acute distress.    Appearance: She is well-developed. She is obese.  Genitourinary:     Pelvic exam was performed with patient supine.     Vagina, uterus and rectum normal.     No lesions in the vagina.     No vaginal bleeding.     No cervical motion tenderness, friability, lesion or polyp.     Uterus is mobile.     Uterus is not enlarged.     No uterine mass detected.    Uterus is midaxial.     No right or left adnexal mass present.     Right adnexa  not tender.     Left adnexa not tender.     Genitourinary Comments: Cervix easy to visualize and cathaterize  Bimanual limited due to obesity  HENT:     Head: Normocephalic and atraumatic. No laceration.     Right Ear: Hearing normal.     Left Ear: Hearing normal.     Mouth/Throat:     Pharynx: Uvula midline.  Eyes:     Pupils: Pupils are equal, round, and reactive to light.  Neck:     Thyroid: No thyromegaly.  Cardiovascular:     Rate and Rhythm: Normal rate  and regular rhythm.     Heart sounds: No murmur heard.  No friction rub. No gallop.   Pulmonary:     Effort: Pulmonary effort is normal. No respiratory distress.     Breath sounds: Normal breath sounds. No wheezing.  Chest:     Breasts:        Right: No mass, skin change or tenderness.        Left: No mass, skin change or tenderness.  Abdominal:     General: Bowel sounds are normal. There is no distension.     Palpations: Abdomen is soft.     Tenderness: There is no abdominal tenderness. There is no rebound.  Musculoskeletal:        General: Normal range of motion.     Cervical back: Normal range of motion and neck supple.  Neurological:     Mental Status: She is alert and oriented to person, place, and time.     Cranial Nerves: No cranial nerve deficit.  Skin:    General: Skin is warm and dry.  Psychiatric:        Judgment: Judgment normal.  Vitals reviewed.     ASSESSMENT/PLAN:  74 yo with 2 year h/o post menopasual bleeding and recent onset pelvic pain L>R  Problem List Items Addressed This Visit    Pelvic pain    -  Primary   Relevant Orders   US PELVIC COMPLETE WITH TRANSVAGINAL   LLQ pain       Relevant Orders   US PELVIC COMPLETE WITH TRANSVAGINAL   Post-menopausal bleeding       Relevant Orders   US PELVIC COMPLETE WITH TRANSVAGINAL   Surgical pathology - see EMB below   Screening for cervical cancer       Relevant Orders   Cytology - PAP    Options for mgt based on results.  Discussed.  Endometrial Biopsy After discussion with the patient regarding her abnormal uterine bleeding I recommended that she proceed with an endometrial biopsy for further diagnosis. The risks, benefits, alternatives, and indications for an endometrial biopsy were discussed with the patient in detail. She understood the risks including infection, bleeding, cervical laceration and uterine perforation.  Verbal consent was obtained.   PROCEDURE NOTE:  [place] endometrial biopsy was  performed using aseptic technique with iodine preparation.  The uterus was sounded to a length of 7 cm.  Adequate sampling was obtained with minimal blood loss.  The patient tolerated the procedure well.  Disposition will be pending pathology.  Barnett Applebaum, MD, [facility], [facility] 08/03/2019  3:49 PM

## 2019-08-03 NOTE — Patient Instructions (Signed)
Endometrial Biopsy, Care After This sheet gives you information about how to care for yourself after your procedure. Your health care provider may also give you more specific instructions. If you have problems or questions, contact your health care provider. What can I expect after the procedure? After the procedure, it is common to have:  Mild cramping.  A small amount of vaginal bleeding for a few days. This is normal. Follow these instructions at home:   Take over-the-counter and prescription medicines only as told by your health care provider.  Do not douche, use tampons, or have sexual intercourse until your health care provider approves.  Return to your normal activities as told by your health care provider. Ask your health care provider what activities are safe for you.  Follow instructions from your health care provider about any activity restrictions, such as restrictions on strenuous exercise or heavy lifting. Contact a health care provider if:  You have heavy bleeding, or bleed for longer than 2 days after the procedure.  You have bad smelling discharge from your vagina.  You have a fever or chills.  You have a burning sensation when urinating or you have difficulty urinating.  You have severe pain in your lower abdomen. Get help right away if:  You have severe cramps in your stomach or back.  You pass large blood clots.  Your bleeding increases.  You become weak or light-headed, or you pass out. Summary  After the procedure, it is common to have mild cramping and a small amount of vaginal bleeding for a few days.  Do not douche, use tampons, or have sexual intercourse until your health care provider approves.  Return to your normal activities as told by your health care provider. Ask your health care provider what activities are safe for you. This information is not intended to replace advice given to you by your health care provider. Make sure you discuss any  questions you have with your health care provider. Document Revised: 12/07/2016 Document Reviewed: 01/11/2016 Elsevier Patient Education  [place].   Transvaginal Ultrasound A transvaginal ultrasound, also called an endovaginal ultrasound, is a test that uses sound waves to take pictures of the female genital tract. The pictures are taken with a device, called a transducer, that is placed in the vagina. This test may be done to:  Check for problems with your pregnancy.  Check your developing baby.  Check for anything abnormal in the uterus or ovaries.  Find out why you have pelvic pain or bleeding. Tell a health care provider about:  Any allergies you have.  All medicines you are taking, including vitamins, herbs, eye drops, creams, and over-the-counter medicines.  Any blood disorders you have.  Any surgeries you have had.  Any medical conditions you have.  Whether you are pregnant or may be pregnant.  Whether you are having your menstrual period. What are the risks? This is a safe procedure. There are no known risks or complications of having this test. What happens before the procedure? This procedure needs to be done when your bladder is empty. Follow your health care provider's instructions about drinking fluids and emptying your bladder before the test. What happens during the procedure?   You will empty your bladder before the procedure.  You will undress from the waist down.  You will lie down on an exam table, with your knees bent and your feet in foot holders.  A health care provider will cover the transducer with a  sterile cover.  A gel will be put on the transducer. The gel helps transmit the sound waves and prevents irritation of your vagina.  The technician will insert the transducer into your vagina to get images. These will be displayed on a monitor that looks like a small television screen.  The transducer will be removed when the procedure  is complete. The procedure may vary among health care providers and hospitals. What happens after the procedure?  It is up to you to get the results of your procedure. Ask your health care provider, or the department that is doing the procedure, when your results will be ready.  Keep all follow-up visits as told by your health care provider. This is important. Summary  A transvaginal ultrasound, also called an endovaginal ultrasound, is a test that uses sound waves to take pictures of the female genital tract.  This is a safe procedure. There are no known risks associated with this test.  The procedure needs to be done when your bladder is empty. Follow your health care provider's instructions about drinking fluids and emptying your bladder before the test.  During the procedure, you will undress from the waist down and lie down on an exam table. A technician will insert a transducer into your vagina to obtain images.  Ask your health care provider, or the department that is doing the procedure, when your results will be ready. This information is not intended to replace advice given to you by your health care provider. Make sure you discuss any questions you have with your health care provider. Document Revised: 08/07/2017 Document Reviewed: 08/07/2017 Elsevier Patient Education  [place].

## 2019-08-05 ENCOUNTER — Other Ambulatory Visit: Payer: Self-pay

## 2019-08-05 ENCOUNTER — Inpatient Hospital Stay: Payer: Medicare HMO | Attending: Obstetrics and Gynecology | Admitting: Obstetrics and Gynecology

## 2019-08-05 ENCOUNTER — Other Ambulatory Visit: Payer: Self-pay | Admitting: Obstetrics & Gynecology

## 2019-08-05 ENCOUNTER — Telehealth: Payer: Self-pay | Admitting: Obstetrics & Gynecology

## 2019-08-05 ENCOUNTER — Encounter: Payer: Self-pay | Admitting: Obstetrics and Gynecology

## 2019-08-05 VITALS — BP 147/91 | HR 86 | Temp 96.7°F | Resp 20 | Wt 249.8 lb

## 2019-08-05 DIAGNOSIS — C541 Malignant neoplasm of endometrium: Secondary | ICD-10-CM | POA: Diagnosis not present

## 2019-08-05 DIAGNOSIS — Z79899 Other long term (current) drug therapy: Secondary | ICD-10-CM | POA: Insufficient documentation

## 2019-08-05 DIAGNOSIS — G4730 Sleep apnea, unspecified: Secondary | ICD-10-CM | POA: Insufficient documentation

## 2019-08-05 DIAGNOSIS — I6523 Occlusion and stenosis of bilateral carotid arteries: Secondary | ICD-10-CM | POA: Diagnosis not present

## 2019-08-05 DIAGNOSIS — K219 Gastro-esophageal reflux disease without esophagitis: Secondary | ICD-10-CM | POA: Diagnosis not present

## 2019-08-05 DIAGNOSIS — Z7982 Long term (current) use of aspirin: Secondary | ICD-10-CM | POA: Diagnosis not present

## 2019-08-05 DIAGNOSIS — J4530 Mild persistent asthma, uncomplicated: Secondary | ICD-10-CM | POA: Diagnosis not present

## 2019-08-05 DIAGNOSIS — Z7951 Long term (current) use of inhaled steroids: Secondary | ICD-10-CM | POA: Insufficient documentation

## 2019-08-05 DIAGNOSIS — R7303 Prediabetes: Secondary | ICD-10-CM | POA: Diagnosis not present

## 2019-08-05 DIAGNOSIS — Z6841 Body Mass Index (BMI) 40.0 and over, adult: Secondary | ICD-10-CM | POA: Diagnosis not present

## 2019-08-05 DIAGNOSIS — G893 Neoplasm related pain (acute) (chronic): Secondary | ICD-10-CM | POA: Insufficient documentation

## 2019-08-05 DIAGNOSIS — Z791 Long term (current) use of non-steroidal anti-inflammatories (NSAID): Secondary | ICD-10-CM | POA: Diagnosis not present

## 2019-08-05 DIAGNOSIS — M109 Gout, unspecified: Secondary | ICD-10-CM | POA: Diagnosis not present

## 2019-08-05 DIAGNOSIS — Z87891 Personal history of nicotine dependence: Secondary | ICD-10-CM | POA: Insufficient documentation

## 2019-08-05 DIAGNOSIS — I10 Essential (primary) hypertension: Secondary | ICD-10-CM | POA: Insufficient documentation

## 2019-08-05 LAB — CYTOLOGY - PAP: Diagnosis: NEGATIVE

## 2019-08-05 LAB — SURGICAL PATHOLOGY

## 2019-08-05 NOTE — Telephone Encounter (Signed)
-----   Message from Gae Dry, MD sent at 08/05/2019 10:52 AM EDT ----- Regarding: Appt cancel Cancel 8/9 appt for [place] and for [facility]

## 2019-08-05 NOTE — Progress Notes (Addendum)
Gynecologic Oncology Consult Visit   Referring Provider: Kenton Kingfisher  Chief Complaint: Clear Cell Endometrial Carcinoma  Subjective:  Cassidy Parsons is a 74 y.o. female who is seen in consultation from Dr. Kenton Kingfisher for clear cell endometrial carcinoma.  No use of HRT.   Patient has ~ 5 year history of PMB, worse since May 2021 after she fell on her stomach. Endorses abdominal pain radiates from left side around front to right pelvis and back. On 7/26 underwent endometrial biopsy.   Pap 08/03/2019- NILM  Pathology:  A. Endometrium, Biopsy:  - Clear cell carcinoma, FIGO grade 3   Husband died in 04/20/22 and she delayed self care because she was taking care of him.   Prediabetes, not on medications.  Problem List: Patient Active Problem List   Diagnosis Date Noted  . Primary clear cell adenocarcinoma of endometrium ([place]) 08/05/2019  . Urinary tract infection without hematuria 08/07/2018  . Dysuria 08/07/2018  . Low back pain 02/05/2017  . Mild persistent asthma, uncomplicated 16/10/9602  . Rhinitis, allergic 02/05/2017  . GAD (generalized anxiety disorder) 02/05/2017  . Unspecified complication of internal orthopedic prosthetic device, implant and graft, initial encounter ([place]) 02/05/2017  . Diabetes mellitus without complication ([place]) 54/09/8117  . Gout, unspecified 02/05/2017  . Occlusion and stenosis of bilateral carotid arteries 02/05/2017  . Insomnia, unspecified 02/05/2017  . Sleep apnea 02/05/2017  . Elevated blood-pressure reading without diagnosis of hypertension 02/05/2017  . Dupuytren's disease of palm 10/05/2016  . Exposed orthopaedic hardware ([place]) 02/10/2015    Past Medical History: Past Medical History:  Diagnosis Date  . Asthma   . Brain tumor (benign) ([place])    right side  . Diabetes mellitus without complication (HCC)    borderline  . Early cataracts, bilateral   . Environmental and seasonal allergies   . GERD (gastroesophageal reflux disease)   .  Hypertension    PMH:  . On home oxygen therapy    at night only  . Seizures ([place])    haven't had a seizure since brain surgery in 04-20-99    Past Surgical History: Past Surgical History:  Procedure Laterality Date  . BRAIN SURGERY    . BREAST SURGERY     biopsy right breast  . CRANIOTOMY N/A 02/10/2015   Procedure: Removal of cranial plate with scalp wound revision;  Surgeon: Earnie Larsson, MD;  Location: [facility];  Service: Neurosurgery;  Laterality: N/A;  . DIAGNOSTIC LAPAROSCOPY     panniculectomy  . TONSILLECTOMY      Past Gynecologic History:  Menarche: age [age] Post menopausal Post menopausal bleeding    OB History:  OB History  Gravida Para Term Preterm AB Living  2 1 1   1 1   SAB TAB Ectopic Multiple Live Births               # Outcome Date GA Lbr Len/2nd Weight Sex Delivery Anes PTL Lv  2 AB           1 Term             Family History: Family History  Problem Relation Age of Onset  . Cancer Mother   . Heart failure Father   . Stroke Sister   . Diabetes Other     Social History: Social History   Socioeconomic History  . Marital status: Married    Spouse name: Not on file  . Number of children: Not on file  . Years of education: Not on file  .  Highest education level: Not on file  Occupational History  . Not on file  Tobacco Use  . Smoking status: Former Smoker    Types: Cigarettes  . Smokeless tobacco: Never Used  . Tobacco comment: quit smoking cigarettes 34 years ago  Vaping Use  . Vaping Use: Never used  Substance and Sexual Activity  . Alcohol use: No  . Drug use: No  . Sexual activity: Not on file  Other Topics Concern  . Not on file  Social History Narrative  . Not on file   Social Determinants of Health   Financial Resource Strain:   . Difficulty of Paying Living Expenses:   Food Insecurity:   . Worried About Charity fundraiser in the Last Year:   . Arboriculturist in the Last Year:   Transportation Needs:   . Lexicographer (Medical):   Marland Kitchen Lack of Transportation (Non-Medical):   Physical Activity:   . Days of Exercise per Week:   . Minutes of Exercise per Session:   Stress:   . Feeling of Stress :   Social Connections:   . Frequency of Communication with Friends and Family:   . Frequency of Social Gatherings with Friends and Family:   . Attends Religious Services:   . Active Member of Clubs or Organizations:   . Attends Archivist Meetings:   Marland Kitchen Marital Status:   Intimate Partner Violence:   . Fear of Current or Ex-Partner:   . Emotionally Abused:   Marland Kitchen Physically Abused:   . Sexually Abused:     Allergies: Allergies  Allergen Reactions  . Mold Extract  [Trichophyton]   . Roxanol [Morphine] Other (See Comments)    Extreme pain  . Meperidine And Related Itching and Rash  . Other Itching and Rash    All pain relievers except hydocodone Trees, mold, dust, and perfume allergies (sneezing, eyes swell up)  . Propoxyphene Itching and Rash  . Tape Itching and Rash    With paper tape.  Please use adhesive tape  . Tramadol Itching and Rash    Current Medications: Current Outpatient Medications  Medication Sig Dispense Refill  . aspirin EC 81 MG tablet Take 81 mg by mouth daily.    Marland Kitchen azelastine (ASTELIN) 0.1 % nasal spray PLACE 1 SPRAY INTO EACH NOSTRIL DAILY ASDIRECTED. 30 mL 3  . Calcium Carb-Cholecalciferol (CALCIUM 600 + D) 600-200 MG-UNIT TABS Take 1 tablet by mouth daily.    . cetirizine (ZYRTEC) 10 MG tablet Take 10 mg by mouth daily.    Marland Kitchen docusate sodium (COLACE) 100 MG capsule Take 200 mg by mouth at bedtime.    Marland Kitchen esomeprazole (NEXIUM) 40 MG capsule Take 40 mg by mouth daily at 12 noon.    . fluocinonide (LIDEX) 0.05 % external solution APPLY 1 APPLICATION TOPICALLY TWICE DAILY AS NEEDED FOR INFECTION 60 mL 1  . fluticasone (FLONASE) 50 MCG/ACT nasal spray PLACE 1 SPRAY INTO BOTH NOSTRILS AT BEDTIME 16 g 0  . Fluticasone-Salmeterol (ADVAIR) 250-50 MCG/DOSE AEPB Inhale 1  puff into the lungs 2 (two) times daily. 3 each 4  . ketoconazole (NIZORAL) 2 % shampoo Apply 1 application topically 2 (two) times a week.    . meloxicam (MOBIC) 7.5 MG tablet Take 1 tablet (7.5 mg total) by mouth daily as needed for pain. 15 tablet 1  . montelukast (SINGULAIR) 10 MG tablet TAKE ONE TABLET BY MOUTH AT BEDTIME. 90 tablet 1  . Multiple Vitamins-Minerals (MULTIVITAMIN ADULT  PO) Take 1 tablet by mouth daily.    Marland Kitchen oxybutynin (DITROPAN) 5 MG tablet TAKE (1) TABLET BY MOUTH EVERY DAY 90 tablet 1  . potassium citrate (UROCIT-K) 10 MEQ (1080 MG) SR tablet TAKE (1) TABLET BY MOUTH TWICE DAILY 180 tablet 1  . risedronate (ACTONEL) 35 MG tablet Take 1 tablet (35 mg total) by mouth every 7 (seven) days. with water on empty stomach, nothing by mouth or lie down for next 30 minutes. 4 tablet 3   No current facility-administered medications for this visit.    General: negative for fevers, changes in weight or night sweats Skin: negative for changes in moles or sores or rash Eyes: negative for changes in vision HEENT: negative for change in hearing, tinnitus, voice changes Pulmonary: negative for dyspnea, orthopnea, productive cough, wheezing Cardiac: negative for palpitations, pain Gastrointestinal: negative for nausea, vomiting, constipation, hematemesis, hematochezia. Positive for diarrhea Genitourinary/Sexual: negative for dysuria, retention, hematuria. Positive for urinary incontinence and frequency Ob/Gyn:  negative for abnormal bleeding, or pain Musculoskeletal: negative for pain, joint pain. Positive for back pain Hematology: negative for easy bruising. Positive for unusual bleeding Neurologic/Psych: negative for headaches, seizures, paralysis, weakness, numbness  Objective:  Physical Examination:  BP (!) 147/91   Pulse 86   Temp (!) 96.7 F (35.9 C)   Resp 20   Wt (!) 249 lb 12.8 oz (113.3 kg)   SpO2 95%   BMI 47.20 kg/m     ECOG Performance Status: 1 - Symptomatic but  completely ambulatory  GENERAL: Patient is a well appearing obese female in no acute distress HEENT:  PERRL, neck supple with midline trachea. Thyroid without masses.  NODES:  No cervical, supraclavicular, axillary, or inguinal lymphadenopathy palpated.  LUNGS:  Clear to auscultation bilaterally.  No wheezes or rhonchi. HEART:  Regular rate and rhythm. No murmur appreciated. ABDOMEN:  Soft, nontender.  Positive, normoactive bowel sounds. Obese. MSK:  No focal spinal tenderness to palpation. Full range of motion bilaterally in the upper extremities. EXTREMITIES:  No peripheral edema.   SKIN:  Clear with no obvious rashes or skin changes. No nail dyscrasia. NEURO:  Nonfocal. Well oriented.  Appropriate affect.  Pelvic: Exam chaperoned by nursing.  EGBUS: no lesions Cervix: no lesions, nontender, mobile Vagina: no lesions, some discharge Uterus: normal size, nontender, mobile Adnexa: no palpable masses Rectovaginal: confirmatory     Assessment:  Cassidy Parsons is a 74 y.o. female diagnosed with clear cell uterine cancer on endometrial biopsy after few years of PMB.     Medical co-morbidities complicating care: morbid obesity (BMI = 47), prediabetes, possible h/o carotid stenosis.  Plan:   Problem List Items Addressed This Visit    None    Visit Diagnoses    Endometrial cancer ([facility])    -  Primary     We discussed options for management including CT scan C/A/P to rule out metastatic disease, as clear cell is an aggressive histotype and she has been bleeding for years. If metastatic disease found will likely start treatment with chemotherapy.  If CT reassuring will plan for TLH, BSO, lymph node staging.  This will be challenging in view of her high BMI and would suggest surgery at [facility].  Also may need cardiology evaluation pre op in view of history of carotid stenosis.    The patient's diagnosis, an outline of the further diagnostic and laboratory studies which will be required,  the recommendation for surgery, and alternatives were discussed with her and her accompanying family members.  All questions were answered to their satisfaction.  Will contact her in next few days with plan after CT scan results available.   A total of 60 minutes were spent with the patient/family today; 40 % was spent in education, counseling and coordination of care for endometrial cancer.    Mellody Drown, MD   CC:  Lavera Guise, [address] [place],  [place] [address] [phone]   Addendum: I spoke to Calton Dach, [place] at [facility]. They will coordinate ct, preop testing and anesthesia at [facility] for this Friday. We will cancel CT scan. Patient will return to [facility] for post op.

## 2019-08-05 NOTE — Telephone Encounter (Signed)
Appointment cancelled

## 2019-08-05 NOTE — Progress Notes (Signed)
Call from Pathologist regarding 08/04/19 EMB results Clear Cell Endometrial Carcinoma  D/w pt Refer to [place] Hysterectomy and necessary procedures in near future She is understanding of diagnosis and prognosis  Barnett Applebaum, MD, [facility], [facility] 08/05/2019  10:55 AM

## 2019-08-05 NOTE — Addendum Note (Signed)
Addended by: Gae Dry on: 08/05/2019 11:59 AM   Modules accepted: Level of Service

## 2019-08-07 DIAGNOSIS — R918 Other nonspecific abnormal finding of lung field: Secondary | ICD-10-CM | POA: Diagnosis not present

## 2019-08-07 DIAGNOSIS — K639 Disease of intestine, unspecified: Secondary | ICD-10-CM | POA: Diagnosis not present

## 2019-08-07 DIAGNOSIS — C541 Malignant neoplasm of endometrium: Secondary | ICD-10-CM | POA: Diagnosis not present

## 2019-08-07 DIAGNOSIS — E669 Obesity, unspecified: Secondary | ICD-10-CM | POA: Diagnosis not present

## 2019-08-07 DIAGNOSIS — Z6841 Body Mass Index (BMI) 40.0 and over, adult: Secondary | ICD-10-CM | POA: Diagnosis not present

## 2019-08-07 DIAGNOSIS — Z87891 Personal history of nicotine dependence: Secondary | ICD-10-CM | POA: Diagnosis not present

## 2019-08-07 DIAGNOSIS — R7303 Prediabetes: Secondary | ICD-10-CM | POA: Diagnosis not present

## 2019-08-07 DIAGNOSIS — Z01818 Encounter for other preprocedural examination: Secondary | ICD-10-CM | POA: Diagnosis not present

## 2019-08-10 ENCOUNTER — Ambulatory Visit: Payer: Medicare HMO

## 2019-08-10 DIAGNOSIS — Z01818 Encounter for other preprocedural examination: Secondary | ICD-10-CM | POA: Diagnosis not present

## 2019-08-10 DIAGNOSIS — Z20822 Contact with and (suspected) exposure to covid-19: Secondary | ICD-10-CM | POA: Diagnosis not present

## 2019-08-11 DIAGNOSIS — D251 Intramural leiomyoma of uterus: Secondary | ICD-10-CM | POA: Diagnosis not present

## 2019-08-11 DIAGNOSIS — D259 Leiomyoma of uterus, unspecified: Secondary | ICD-10-CM | POA: Diagnosis not present

## 2019-08-11 DIAGNOSIS — K5730 Diverticulosis of large intestine without perforation or abscess without bleeding: Secondary | ICD-10-CM | POA: Diagnosis not present

## 2019-08-11 DIAGNOSIS — Z6841 Body Mass Index (BMI) 40.0 and over, adult: Secondary | ICD-10-CM | POA: Diagnosis not present

## 2019-08-11 DIAGNOSIS — Z87891 Personal history of nicotine dependence: Secondary | ICD-10-CM | POA: Diagnosis not present

## 2019-08-11 DIAGNOSIS — N83291 Other ovarian cyst, right side: Secondary | ICD-10-CM | POA: Diagnosis not present

## 2019-08-11 DIAGNOSIS — Z9889 Other specified postprocedural states: Secondary | ICD-10-CM | POA: Diagnosis not present

## 2019-08-11 DIAGNOSIS — C548 Malignant neoplasm of overlapping sites of corpus uteri: Secondary | ICD-10-CM | POA: Diagnosis not present

## 2019-08-11 DIAGNOSIS — C541 Malignant neoplasm of endometrium: Secondary | ICD-10-CM | POA: Diagnosis not present

## 2019-08-11 DIAGNOSIS — J4530 Mild persistent asthma, uncomplicated: Secondary | ICD-10-CM | POA: Diagnosis not present

## 2019-08-11 DIAGNOSIS — N736 Female pelvic peritoneal adhesions (postinfective): Secondary | ICD-10-CM | POA: Diagnosis not present

## 2019-08-11 DIAGNOSIS — N950 Postmenopausal bleeding: Secondary | ICD-10-CM | POA: Diagnosis not present

## 2019-08-11 DIAGNOSIS — Z7951 Long term (current) use of inhaled steroids: Secondary | ICD-10-CM | POA: Diagnosis not present

## 2019-08-13 ENCOUNTER — Ambulatory Visit: Payer: Medicare HMO

## 2019-08-14 ENCOUNTER — Other Ambulatory Visit: Payer: Self-pay | Admitting: Obstetrics & Gynecology

## 2019-08-17 ENCOUNTER — Ambulatory Visit: Payer: Medicare Other

## 2019-08-17 ENCOUNTER — Ambulatory Visit: Payer: Medicare Other | Admitting: Obstetrics & Gynecology

## 2019-08-24 ENCOUNTER — Telehealth: Payer: Self-pay

## 2019-08-24 NOTE — Telephone Encounter (Signed)
Final pathology noted. Has gyn oncology follow up on 09/09/19

## 2019-08-26 ENCOUNTER — Telehealth: Payer: Self-pay

## 2019-08-26 NOTE — Telephone Encounter (Signed)
Pt called that she had just recent surgery that its ok to take booster shots as per adam  Wait for few for more weeks and for cough try mucinex and not feeling betterneed to been seen

## 2019-09-03 DIAGNOSIS — C541 Malignant neoplasm of endometrium: Secondary | ICD-10-CM | POA: Diagnosis not present

## 2019-09-09 ENCOUNTER — Inpatient Hospital Stay: Payer: Medicare HMO | Attending: Obstetrics and Gynecology | Admitting: Obstetrics and Gynecology

## 2019-09-09 ENCOUNTER — Encounter: Payer: Self-pay | Admitting: Obstetrics and Gynecology

## 2019-09-09 ENCOUNTER — Other Ambulatory Visit: Payer: Self-pay

## 2019-09-09 VITALS — BP 146/80 | HR 89 | Temp 98.7°F | Resp 20 | Wt 247.0 lb

## 2019-09-09 DIAGNOSIS — R918 Other nonspecific abnormal finding of lung field: Secondary | ICD-10-CM | POA: Insufficient documentation

## 2019-09-09 DIAGNOSIS — I10 Essential (primary) hypertension: Secondary | ICD-10-CM | POA: Insufficient documentation

## 2019-09-09 DIAGNOSIS — Z7982 Long term (current) use of aspirin: Secondary | ICD-10-CM | POA: Insufficient documentation

## 2019-09-09 DIAGNOSIS — Z90722 Acquired absence of ovaries, bilateral: Secondary | ICD-10-CM | POA: Insufficient documentation

## 2019-09-09 DIAGNOSIS — Z9981 Dependence on supplemental oxygen: Secondary | ICD-10-CM | POA: Insufficient documentation

## 2019-09-09 DIAGNOSIS — Z79899 Other long term (current) drug therapy: Secondary | ICD-10-CM | POA: Insufficient documentation

## 2019-09-09 DIAGNOSIS — Z7951 Long term (current) use of inhaled steroids: Secondary | ICD-10-CM | POA: Insufficient documentation

## 2019-09-09 DIAGNOSIS — Z791 Long term (current) use of non-steroidal anti-inflammatories (NSAID): Secondary | ICD-10-CM | POA: Insufficient documentation

## 2019-09-09 DIAGNOSIS — Z6841 Body Mass Index (BMI) 40.0 and over, adult: Secondary | ICD-10-CM | POA: Insufficient documentation

## 2019-09-09 DIAGNOSIS — E119 Type 2 diabetes mellitus without complications: Secondary | ICD-10-CM | POA: Insufficient documentation

## 2019-09-09 DIAGNOSIS — K219 Gastro-esophageal reflux disease without esophagitis: Secondary | ICD-10-CM | POA: Insufficient documentation

## 2019-09-09 DIAGNOSIS — Z87891 Personal history of nicotine dependence: Secondary | ICD-10-CM | POA: Insufficient documentation

## 2019-09-09 DIAGNOSIS — Z90710 Acquired absence of both cervix and uterus: Secondary | ICD-10-CM | POA: Insufficient documentation

## 2019-09-09 DIAGNOSIS — C541 Malignant neoplasm of endometrium: Secondary | ICD-10-CM | POA: Insufficient documentation

## 2019-09-09 DIAGNOSIS — L989 Disorder of the skin and subcutaneous tissue, unspecified: Secondary | ICD-10-CM | POA: Insufficient documentation

## 2019-09-09 NOTE — Progress Notes (Signed)
Gynecologic Oncology Consult Visit   Referring Provider: Dr. Kenton Kingfisher  Chief Complaint: Endometrial Carcinoma  Subjective:  Cassidy Parsons is a 74 y.o. female who is seen in consultation from Dr. Kenton Kingfisher for clear cell endometrial carcinoma.  No use of HRT.   Returns today for post op check.  No significant complaints.   Gyn Oncology history Patient has ~ 5 year history of PMB, worse since May 2021 after she fell on her stomach. Endorses abdominal pain radiates from left side around front to right pelvis and back. On 7/26 underwent endometrial biopsy.   Pap 08/03/2019- NILM  Pathology:  A. Endometrium, Biopsy:  - Clear cell carcinoma, FIGO grade 3   Husband died in 04-11-22 and she delayed self care because she was taking care of him.   CT scan C/A/P 7/30 did not show metastatic disease.  Underwent TLH, BSO SLN mapping at [facility] 08/11/19.  Grade 2 endometrioid 4.2 cm cancer with 26% invasion.  Negative right adnexa and cervix and no LVSI.  Left adnexa could not be removed due to dense adhesions of sigmoid colon secondary to severe diverticular disease.   Comment: The tumor demonstrates foci of well-formed endometrioid glands with defined nuclear polarity, but a considerable portion of the tumor appears solid, with large areas of squamous differentiation.  Morphologic features of clear cell carcinoma, such as marked nuclear atypia, hobnail architecture, hyalinized stroma, and intracytoplasmic eosinophilic globules, are not identified.  Secretory change is present in some cells, which stain positive for the markers Napsin A and AMACR.  The reported history of a prior biopsy with clear cell carcinoma is noted.  Review of this biopsy may be beneficial to exclude the possibility of a mixed clear cell/endometrioid tumor, if this diagnosis would influence patient management.  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   Prediabetes, not on medications.  Problem  List: Patient Active Problem List   Diagnosis Date Noted  . Primary clear cell adenocarcinoma of endometrium ([place]) 08/05/2019  . Urinary tract infection without hematuria 08/07/2018  . Dysuria 08/07/2018  . Low back pain 02/05/2017  . Mild persistent asthma, uncomplicated 46/50/3546  . Rhinitis, allergic 02/05/2017  . GAD (generalized anxiety disorder) 02/05/2017  . Unspecified complication of internal orthopedic prosthetic device, implant and graft, initial encounter ([place]) 02/05/2017  . Diabetes mellitus without complication ([place]) 56/81/2751  . Gout, unspecified 02/05/2017  . Occlusion and stenosis of bilateral carotid arteries 02/05/2017  . Insomnia, unspecified 02/05/2017  . Sleep apnea 02/05/2017  . Elevated blood-pressure reading without diagnosis of hypertension 02/05/2017  . Dupuytren's disease of palm 10/05/2016  . Exposed orthopaedic hardware ([place]) 02/10/2015    Past Medical History: Past Medical History:  Diagnosis Date  . Asthma   . Brain tumor (benign) ([place])    right side  . Diabetes mellitus without complication (HCC)    borderline  . Early cataracts, bilateral   . Environmental and seasonal allergies   . GERD (gastroesophageal reflux disease)   . Hypertension    PMH:  . On home oxygen therapy    at night only  . Seizures ([place])    haven't had a seizure since brain surgery in 04-11-1999    Past Surgical History: Past Surgical History:  Procedure Laterality Date  . BRAIN SURGERY    . BREAST SURGERY     biopsy right breast  . CRANIOTOMY N/A 02/10/2015   Procedure: Removal of cranial plate with scalp wound revision;  Surgeon: Earnie Larsson, MD;  Location:  [facility];  Service: Neurosurgery;  Laterality: N/A;  . DIAGNOSTIC LAPAROSCOPY     panniculectomy  . TONSILLECTOMY      Past Gynecologic History:  Menarche: age 38-11 Post menopausal Post menopausal bleeding    OB History:  OB History  Gravida Para Term Preterm AB Living  $Remov'2 1 1   1 1  'lIDxec$ SAB TAB Ectopic  Multiple Live Births               # Outcome Date GA Lbr Len/2nd Weight Sex Delivery Anes PTL Lv  2 AB           1 Term             Family History: Family History  Problem Relation Age of Onset  . Cancer Mother   . Heart failure Father   . Stroke Sister   . Diabetes Other     Social History: Social History   Socioeconomic History  . Marital status: Widowed    Spouse name: Not on file  . Number of children: Not on file  . Years of education: Not on file  . Highest education level: Not on file  Occupational History  . Not on file  Tobacco Use  . Smoking status: Former Smoker    Types: Cigarettes  . Smokeless tobacco: Never Used  . Tobacco comment: quit smoking cigarettes 34 years ago  Vaping Use  . Vaping Use: Never used  Substance and Sexual Activity  . Alcohol use: No  . Drug use: No  . Sexual activity: Not on file  Other Topics Concern  . Not on file  Social History Narrative  . Not on file   Social Determinants of Health   Financial Resource Strain:   . Difficulty of Paying Living Expenses: Not on file  Food Insecurity:   . Worried About Charity fundraiser in the Last Year: Not on file  . Ran Out of Food in the Last Year: Not on file  Transportation Needs:   . Lack of Transportation (Medical): Not on file  . Lack of Transportation (Non-Medical): Not on file  Physical Activity:   . Days of Exercise per Week: Not on file  . Minutes of Exercise per Session: Not on file  Stress:   . Feeling of Stress : Not on file  Social Connections:   . Frequency of Communication with Friends and Family: Not on file  . Frequency of Social Gatherings with Friends and Family: Not on file  . Attends Religious Services: Not on file  . Active Member of Clubs or Organizations: Not on file  . Attends Archivist Meetings: Not on file  . Marital Status: Not on file  Intimate Partner Violence:   . Fear of Current or Ex-Partner: Not on file  . Emotionally Abused:  Not on file  . Physically Abused: Not on file  . Sexually Abused: Not on file    Allergies: Allergies  Allergen Reactions  . Mold Extract  [Trichophyton]   . Roxanol [Morphine] Other (See Comments)    Extreme pain  . Meperidine And Related Itching and Rash  . Other Itching and Rash    All pain relievers except hydocodone Trees, mold, dust, and perfume allergies (sneezing, eyes swell up)  . Propoxyphene Itching and Rash  . Tape Itching and Rash    With paper tape.  Please use adhesive tape  . Tramadol Itching and Rash    Current Medications: Current Outpatient Medications  Medication  Sig Dispense Refill  . aspirin EC 81 MG tablet Take 81 mg by mouth daily.    Marland Kitchen azelastine (ASTELIN) 0.1 % nasal spray PLACE 1 SPRAY INTO EACH NOSTRIL DAILY ASDIRECTED. 30 mL 3  . Calcium Carb-Cholecalciferol (CALCIUM 600 + D) 600-200 MG-UNIT TABS Take 1 tablet by mouth daily.    . cetirizine (ZYRTEC) 10 MG tablet Take 10 mg by mouth daily.    Marland Kitchen docusate sodium (COLACE) 100 MG capsule Take 200 mg by mouth at bedtime.    Marland Kitchen esomeprazole (NEXIUM) 40 MG capsule Take 40 mg by mouth daily at 12 noon.    . fluocinonide (LIDEX) 0.05 % external solution APPLY 1 APPLICATION TOPICALLY TWICE DAILY AS NEEDED FOR INFECTION 60 mL 1  . fluticasone (FLONASE) 50 MCG/ACT nasal spray PLACE 1 SPRAY INTO BOTH NOSTRILS AT BEDTIME 16 g 0  . Fluticasone-Salmeterol (ADVAIR) 250-50 MCG/DOSE AEPB Inhale 1 puff into the lungs 2 (two) times daily. 3 each 4  . ketoconazole (NIZORAL) 2 % shampoo Apply 1 application topically 2 (two) times a week.    . meloxicam (MOBIC) 7.5 MG tablet Take 1 tablet (7.5 mg total) by mouth daily as needed for pain. 15 tablet 1  . montelukast (SINGULAIR) 10 MG tablet TAKE ONE TABLET BY MOUTH AT BEDTIME. 90 tablet 1  . Multiple Vitamins-Minerals (MULTIVITAMIN ADULT PO) Take 1 tablet by mouth daily.    Marland Kitchen oxybutynin (DITROPAN) 5 MG tablet TAKE (1) TABLET BY MOUTH EVERY DAY 90 tablet 1  . potassium citrate  (UROCIT-K) 10 MEQ (1080 MG) SR tablet TAKE (1) TABLET BY MOUTH TWICE DAILY 180 tablet 1  . risedronate (ACTONEL) 35 MG tablet Take 1 tablet (35 mg total) by mouth every 7 (seven) days. with water on empty stomach, nothing by mouth or lie down for next 30 minutes. 4 tablet 3   No current facility-administered medications for this visit.   ROS Per HPI  Objective:  Physical Examination:  BP (!) 146/80   Pulse 89   Temp 98.7 F (37.1 C)   Resp 20   Wt 247 lb (112 kg)   SpO2 98%   BMI 46.67 kg/m     ECOG Performance Status: 1 - Symptomatic but completely ambulatory  GENERAL: Patient is a well appearing obese female in no acute distress HEENT:  PERRL, neck supple with midline trachea. Thyroid without masses.  NODES:  No cervical, supraclavicular, axillary, or inguinal lymphadenopathy palpated.  LUNGS:  Clear to auscultation bilaterally.  No wheezes or rhonchi. HEART:  Regular rate and rhythm. No murmur appreciated. ABDOMEN:  Soft, nontender. Obese.  Incisions healing slowly with some redness due to yeast around incisions and in skin folds.  MSK:  No focal spinal tenderness to palpation. Full range of motion bilaterally in the upper extremities. EXTREMITIES:  No peripheral edema.   SKIN:  Clear with no obvious rashes or skin changes. No nail dyscrasia. NEURO:  Nonfocal. Well oriented.  Appropriate affect.  Pelvic: Exam chaperoned by nursing.  EGBUS: no lesions Vagina: cuff healing slowly, but intact.  Uterus: normal size, nontender, mobile Adnexa: no palpable masses  CT scan C/A/P 08/07/19 Impression:  1. Heterogeneous and thickened appearance of the endometrium in keeping with patients known primary malignancy.  2. No definite evidence of metastatic disease in the chest, abdomen, or pelvis.  3. Bilateral pulmonary nodules, 2-3 mm nonspecific. Recommend continued attention on follow-up.  4. Mild thickening of the sigmoid colon and tethering of the superior bladder wall, favored  sequela of prior  diverticulitis.     Assessment:  Cassidy Parsons is a 74 y.o. female diagnosed with clear cell uterine cancer on endometrial biopsy after few years of PMB.   Underwent TLH, RSO 08/11/19 and found to have 4.2 cm stage IA grade 2 endometrioid cancer with changes most consistent with progestational effect rather than clear cell histology.  SLN mapping unsuccessful and left adnexa could not be removed due to severe adhesions from diverticular disease.  No LVSI, or cervical/adnexal involvement and invades 23% of the myometrium.   Normal post op exam.    Medical co-morbidities complicating care: morbid obesity (BMI = 47), prediabetes, possible h/o carotid stenosis.  Plan:   Problem List Items Addressed This Visit      Genitourinary   Primary clear cell adenocarcinoma of endometrium ([place]) - Primary      [facility] tumor board review  IA, grade 2 endometrioid adenocarcinoma of the endometrium. Tumor size 4.2 cm, invading 0.5 cm in a 1.9 cm myometrium. No LVSI. Negative cervix, right tube/ovary and washings. She was incompletely staged due to habitus and adhesive disease due to diverticulitis.   Her OSH endometrial biopsy from 08/03/19 was read as clear cell carcinoma, although there were no IHC stains done.   Recommendations: obtain the OSH slides for [facility] review for comparison to current pathology to determine if clear cell carcinoma was definitively present or if the endometrial biopsy had similar morphologic clear cell features.   If the [facility] review confirms clear cell carcinoma recommendation for chemotherapy +VBT vs WPRT. She could also be screened for the [id number] trial.  If endometrial biopsy thought to be endometrioid will not recommend adjuvant therapy, and I think this is most likely.   Bilateral pulmonary nodules, 2-3 mm nonspecific on CT scan. Recommend continued attention on follow-up.  She will continue to use Nystatin powder for yeast infection in abdominal skin folds and  around incisions.   Will schedule RTC 3 months and will contact her with results of [facility] path review.   Mellody Drown, MD   CC:  Lavera Guise, [address],  [place] [address] [phone]

## 2019-09-17 ENCOUNTER — Other Ambulatory Visit: Payer: Self-pay | Admitting: Adult Health

## 2019-09-17 ENCOUNTER — Telehealth: Payer: Self-pay

## 2019-09-17 DIAGNOSIS — C541 Malignant neoplasm of endometrium: Secondary | ICD-10-CM

## 2019-09-17 DIAGNOSIS — J309 Allergic rhinitis, unspecified: Secondary | ICD-10-CM

## 2019-09-17 DIAGNOSIS — R69 Illness, unspecified: Secondary | ICD-10-CM | POA: Diagnosis not present

## 2019-09-17 NOTE — Telephone Encounter (Signed)
Spoke with Dr. Fransisca Connors. Referral sent to radiation for whole pelvic radiation.

## 2019-09-25 ENCOUNTER — Encounter: Payer: Self-pay | Admitting: Radiation Oncology

## 2019-09-25 ENCOUNTER — Inpatient Hospital Stay (HOSPITAL_BASED_OUTPATIENT_CLINIC_OR_DEPARTMENT_OTHER): Payer: Medicare HMO | Admitting: Nurse Practitioner

## 2019-09-25 ENCOUNTER — Telehealth: Payer: Self-pay

## 2019-09-25 ENCOUNTER — Other Ambulatory Visit: Payer: Self-pay

## 2019-09-25 ENCOUNTER — Other Ambulatory Visit: Payer: Self-pay | Admitting: Nurse Practitioner

## 2019-09-25 VITALS — BP 140/94 | HR 100 | Temp 97.8°F | Resp 20 | Wt 246.3 lb

## 2019-09-25 DIAGNOSIS — K219 Gastro-esophageal reflux disease without esophagitis: Secondary | ICD-10-CM | POA: Diagnosis not present

## 2019-09-25 DIAGNOSIS — Z90722 Acquired absence of ovaries, bilateral: Secondary | ICD-10-CM | POA: Diagnosis not present

## 2019-09-25 DIAGNOSIS — Z87891 Personal history of nicotine dependence: Secondary | ICD-10-CM | POA: Diagnosis not present

## 2019-09-25 DIAGNOSIS — Z791 Long term (current) use of non-steroidal anti-inflammatories (NSAID): Secondary | ICD-10-CM | POA: Diagnosis not present

## 2019-09-25 DIAGNOSIS — Z90710 Acquired absence of both cervix and uterus: Secondary | ICD-10-CM | POA: Diagnosis not present

## 2019-09-25 DIAGNOSIS — T8131XS Disruption of external operation (surgical) wound, not elsewhere classified, sequela: Secondary | ICD-10-CM | POA: Diagnosis not present

## 2019-09-25 DIAGNOSIS — Z79899 Other long term (current) drug therapy: Secondary | ICD-10-CM | POA: Diagnosis not present

## 2019-09-25 DIAGNOSIS — R918 Other nonspecific abnormal finding of lung field: Secondary | ICD-10-CM | POA: Diagnosis not present

## 2019-09-25 DIAGNOSIS — L989 Disorder of the skin and subcutaneous tissue, unspecified: Secondary | ICD-10-CM | POA: Diagnosis not present

## 2019-09-25 DIAGNOSIS — Z9981 Dependence on supplemental oxygen: Secondary | ICD-10-CM | POA: Diagnosis not present

## 2019-09-25 DIAGNOSIS — Z6841 Body Mass Index (BMI) 40.0 and over, adult: Secondary | ICD-10-CM | POA: Diagnosis not present

## 2019-09-25 DIAGNOSIS — C541 Malignant neoplasm of endometrium: Secondary | ICD-10-CM | POA: Diagnosis not present

## 2019-09-25 DIAGNOSIS — Z7982 Long term (current) use of aspirin: Secondary | ICD-10-CM | POA: Diagnosis not present

## 2019-09-25 DIAGNOSIS — Z7951 Long term (current) use of inhaled steroids: Secondary | ICD-10-CM | POA: Diagnosis not present

## 2019-09-25 DIAGNOSIS — E119 Type 2 diabetes mellitus without complications: Secondary | ICD-10-CM | POA: Diagnosis not present

## 2019-09-25 DIAGNOSIS — I10 Essential (primary) hypertension: Secondary | ICD-10-CM | POA: Diagnosis not present

## 2019-09-25 NOTE — Telephone Encounter (Signed)
During pre-appointment phone calls, Cassidy Parsons voiced concern regarding one of her lap incisions. She states the adhesive has came off and most of the wound is closed except for two small holes. Denies any drainage from the area. She has been keeping the area open to air. She is worried this will delay her radiation start. She was reassured and offered an appointment in the [facility]. She will come today at 1445 for evaluation by NP.

## 2019-09-25 NOTE — Progress Notes (Signed)
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  She underwent TLH RSO on 08/11/2019 and found to have a 4.2 cm stage Ia grade 2 endometrioid cancer with changes most consistent with progestional effect. Sentinel lymph node mapping unsuccessful and left adnexa could not be removed due to severe adhesions from diverticular disease and body habitus.  No LVSI or cervical/adnexal involvement and invades 23% of the myometrium.  Adjuvant whole pelvic radiation was recommended.   Interval history- Wound Check:  Cassidy Parsons presents for wound check. She has a surgical incision wound which is located on the abdomen. Current symptoms: redness surrounding wound and skin irritation from bandaids. She has noticed thin yellow to clear fluid from wound bed that has stained her clothing. She applied triple antibiotic ointment but had skin reaction/worsening of redness. She's been trying to let the wound 'get air' and has kept it uncovered. She is very sensitive to adhesive dressings. She previously had silver nitrate applied to wound bed by Dr. Fransisca Connors to encourage healing which helped but hasn't yet resolved her symptoms. She was thought to have yeast around skin incisions and has applied nystatin powder without improvement. Has improved redness in abdominal skin folds. She is concerned that her symptoms will delay her radiation treatments. No fevers, chills, or purulent drainage. Nontender.    Review of systems- Review of Systems  Constitutional: Negative for chills, fever  and malaise/fatigue.  Skin: Negative for itching and rash.       Allergies  Allergen Reactions  . Mold Extract  [Trichophyton]   . Roxanol [Morphine] Other (See Comments)    Extreme pain  . Meperidine And Related Itching and Rash  . Other Itching and Rash    All pain relievers except hydocodone Trees, mold, dust, and perfume allergies (sneezing, eyes swell up)  . Propoxyphene Itching and Rash  . Tape Itching and Rash    With paper tape.  Please use adhesive tape  . Tramadol Itching and Rash    Past Medical History:  Diagnosis Date  . Asthma   . Brain tumor (benign) ([place])    right side  . Diabetes mellitus without complication (HCC)    borderline  . Early cataracts, bilateral   . Environmental and seasonal allergies   . GERD (gastroesophageal reflux disease)   . Hypertension    PMH:  . On home oxygen therapy    at night only  . Seizures ([place])    haven't had a seizure since brain surgery in 2001    Past Surgical History:  Procedure Laterality Date  . BRAIN SURGERY    . BREAST SURGERY     biopsy right breast  . CRANIOTOMY N/A 02/10/2015   Procedure: Removal of cranial plate with scalp wound revision;  Surgeon: Earnie Larsson, MD;  Location: [facility];  Service: Neurosurgery;  Laterality: N/A;  . DIAGNOSTIC LAPAROSCOPY     panniculectomy  . TONSILLECTOMY      Social History   Socioeconomic History  . Marital status: Widowed    Spouse name: Not on file  . Number of children: Not on file  .  Years of education: Not on file  . Highest education level: Not on file  Occupational History  . Not on file  Tobacco Use  . Smoking status: Former Smoker    Types: Cigarettes  . Smokeless tobacco: Never Used  . Tobacco comment: quit smoking cigarettes 34 years ago  Vaping Use  . Vaping Use: Never used  Substance and Sexual Activity  . Alcohol use: No  . Drug use: No  . Sexual activity: Not on file  Other Topics Concern  . Not on file  Social History Narrative  .  Not on file   Social Determinants of Health   Financial Resource Strain:   . Difficulty of Paying Living Expenses: Not on file  Food Insecurity:   . Worried About Charity fundraiser in the Last Year: Not on file  . Ran Out of Food in the Last Year: Not on file  Transportation Needs:   . Lack of Transportation (Medical): Not on file  . Lack of Transportation (Non-Medical): Not on file  Physical Activity:   . Days of Exercise per Week: Not on file  . Minutes of Exercise per Session: Not on file  Stress:   . Feeling of Stress : Not on file  Social Connections:   . Frequency of Communication with Friends and Family: Not on file  . Frequency of Social Gatherings with Friends and Family: Not on file  . Attends Religious Services: Not on file  . Active Member of Clubs or Organizations: Not on file  . Attends Archivist Meetings: Not on file  . Marital Status: Not on file  Intimate Partner Violence:   . Fear of Current or Ex-Partner: Not on file  . Emotionally Abused: Not on file  . Physically Abused: Not on file  . Sexually Abused: Not on file    Family History  Problem Relation Age of Onset  . Cancer Mother   . Heart failure Father   . Stroke Sister   . Diabetes Other      Current Outpatient Medications:  .  aspirin EC 81 MG tablet, Take 81 mg by mouth daily., Disp: , Rfl:  .  azelastine (ASTELIN) 0.1 % nasal spray, PLACE 1 SPRAY INTO EACH NOSTRIL DAILY ASDIRECTED., Disp: 30 mL, Rfl: 3 .  Calcium Carb-Cholecalciferol (CALCIUM 600 + D) 600-200 MG-UNIT TABS, Take 1 tablet by mouth daily., Disp: , Rfl:  .  cetirizine (ZYRTEC) 10 MG tablet, Take 10 mg by mouth daily., Disp: , Rfl:  .  docusate sodium (COLACE) 100 MG capsule, Take 200 mg by mouth at bedtime., Disp: , Rfl:  .  esomeprazole (NEXIUM) 40 MG capsule, Take 40 mg by mouth daily at 12 noon., Disp: , Rfl:  .  fluocinonide (LIDEX) 0.05 % external solution, APPLY 1 APPLICATION TOPICALLY TWICE DAILY AS NEEDED FOR  INFECTION, Disp: 60 mL, Rfl: 1 .  fluticasone (FLONASE) 50 MCG/ACT nasal spray, PLACE 1 SPRAY INTO BOTH NOSTRILS AT BEDTIME, Disp: 16 g, Rfl: 0 .  Fluticasone-Salmeterol (ADVAIR) 250-50 MCG/DOSE AEPB, Inhale 1 puff into the lungs 2 (two) times daily., Disp: 3 each, Rfl: 4 .  ketoconazole (NIZORAL) 2 % shampoo, Apply 1 application topically 2 (two) times a week., Disp: , Rfl:  .  meloxicam (MOBIC) 7.5 MG tablet, Take 1 tablet (7.5 mg total) by mouth daily as needed for pain., Disp: 15 tablet, Rfl: 1 .  montelukast (SINGULAIR) 10 MG tablet, TAKE ONE TABLET BY MOUTH AT BEDTIME., Disp: 90 tablet, Rfl:  1 .  Multiple Vitamins-Minerals (MULTIVITAMIN ADULT PO), Take 1 tablet by mouth daily., Disp: , Rfl:  .  oxybutynin (DITROPAN) 5 MG tablet, TAKE (1) TABLET BY MOUTH EVERY DAY, Disp: 90 tablet, Rfl: 1 .  potassium citrate (UROCIT-K) 10 MEQ (1080 MG) SR tablet, TAKE (1) TABLET BY MOUTH TWICE DAILY, Disp: 180 tablet, Rfl: 1 .  risedronate (ACTONEL) 35 MG tablet, Take 1 tablet (35 mg total) by mouth every 7 (seven) days. with water on empty stomach, nothing by mouth or lie down for next 30 minutes., Disp: 4 tablet, Rfl: 3  Physical exam:  Vitals:   09/25/19 1451  BP: (!) 140/94  Pulse: 100  Resp: 20  Temp: 97.8 F (36.6 C)  SpO2: 96%  Weight: 246 lb 4.8 oz (111.7 kg)   Physical Exam Constitutional:      General: She is not in acute distress. Skin:    General: Skin is warm and dry.     Comments: Surgical incision at midline with evidence of granulation tissue and evidence of healing. Scant serous fluid present at wound. Applied silver nitrate and covered with petroleum jelly followed by gauze dressing and paper tape to protect clothing.   Neurological:     Mental Status: She is alert.      CMP Latest Ref Rng & Units 11/10/2018  Glucose 65 - 99 mg/dL 113(H)  BUN 8 - 27 mg/dL 16  Creatinine 0.57 - 1.00 mg/dL 0.76  Sodium 134 - 144 mmol/L 141  Potassium 3.5 - 5.2 mmol/L 4.4  Chloride 96 - 106  mmol/L 102  CO2 20 - 29 mmol/L 25  Calcium 8.7 - 10.3 mg/dL 9.3  Total Protein 6.0 - 8.5 g/dL 6.8  Total Bilirubin 0.0 - 1.2 mg/dL 0.4  Alkaline Phos 39 - 117 IU/L 93  AST 0 - 40 IU/L 20  ALT 0 - 32 IU/L 16   CBC Latest Ref Rng & Units 11/10/2018  WBC 3.4 - 10.8 x10E3/uL 6.3  Hemoglobin 11.1 - 15.9 g/dL 14.1  Hematocrit 34.0 - 46.6 % 41.9  Platelets 150 - 450 x10E3/uL 283    No images are attached to the encounter.  No results found.  Assessment and plan- Patient is a 74 y.o. female diagnosed with early stage endometrioid endometrial cancer s/p TLH RSO with incomplete staging who presents to Symptom management clinic for wound check. No evidence of infection. Silver nitrate applied along with vaseline. Encouraged patient to keep wound covered with vaseline. Previous reaction to triple antibiotic which is not uncommon. Symptoms of infection reviewed. I don't feel there is a need to hold radiation given current wound state and she can follow up with Dr. Baruch Gouty as planned. Return to symptom management clinic if symptoms don't continue to improve or if they worsen.    Visit Diagnosis 1. Wound disruption, post-op, skin, sequela     Patient expressed understanding and was in agreement with this plan. She also understands that She can call clinic at any time with any questions, concerns, or complaints.   Thank you for allowing me to participate in the care of this very pleasant patient.   Beckey Rutter, DNP, AGNP-C [facility]

## 2019-09-28 ENCOUNTER — Other Ambulatory Visit: Payer: Self-pay

## 2019-09-28 ENCOUNTER — Ambulatory Visit
Admission: RE | Admit: 2019-09-28 | Discharge: 2019-09-28 | Disposition: A | Discharge status: Home / Self Care | Payer: Medicare HMO | Source: Ambulatory Visit | Attending: Radiation Oncology | Admitting: Radiation Oncology

## 2019-09-28 VITALS — BP 161/85 | HR 78 | Temp 97.0°F | Resp 18 | Wt 246.6 lb

## 2019-09-28 DIAGNOSIS — C541 Malignant neoplasm of endometrium: Secondary | ICD-10-CM | POA: Diagnosis not present

## 2019-09-28 NOTE — Consult Note (Signed)
NEW PATIENT EVALUATION  Name: Cassidy Parsons  MRN: [id number]  Date:   09/28/2019     DOB: 11-Jun-1945   This 74 y.o. female patient presents to the clinic for initial evaluation of stage Ia clear cell carcinoma the endometrium status post TAH/BSO.  REFERRING PHYSICIAN: Lavera Guise, MD  CHIEF COMPLAINT:  Chief Complaint  Patient presents with  . Consult    Initial consultation    DIAGNOSIS: There were no encounter diagnoses.   PREVIOUS INVESTIGATIONS:  Pathology report reviewed Clinical notes reviewed  HPI: Patient is a 74 year old female who has a 5-year history of postmenopausal bleeding which worsened in May 2021.  She was seen by her gynecologist underwent an endometrial biopsy on 726 showing clear cell carcinoma FIGO grade 3.  She was taken to [facility] and underwent TAH/BSO with the following pathology TUMOR   Tumor Site:  Endometrium   Histologic Type:  Endometrioid carcinoma, NOS   Histologic Grade:  FIGO grade 2   Tumor Size:  Greatest dimension in Centimeters (cm): 4.2 Centimeters (cm)   Tumor Extent:      Myometrial Invasion:  Present     Depth of Invasion in Millimeters(mm):  5 Millimeters (mm)     Myometrial Thickness in Millimeters (mm):  19 Millimeters (mm)     Percentage of Myometrial Invasion:  26 %    Uterine Serosa Involvement:  Not identified    Cervical Stromal Involvement:  Not identified    Other Tissue / Organ Involvement:  Not identified    Peritoneal Ascitic Fluid:  Negative for malignancy (normal / benign)   Accessory Findings:      Lymphovascular Invasion:  Not identified   Margins   LYMPH NODES   Regional Lymph Nodes:  No lymph nodes submitted or found    Total Number of Pelvic Nodes Examined:  0    Total Number of Para-aortic Nodes Examined:  0   PATHOLOGIC STAGE CLASSIFICATION (pTNM, AJCC 8th Edition)   Primary Tumor (pT):  pT1a   Regional Lymph  Nodes (pN):      Category (pN):  pNX   FIGO STAGE   FIGO Stage:  IA She is had some wound healing although there is granular granulation tissue in the wound now.  She had a CT scan as part of her work-up at [facility] showing heterogeneous and thickened appearance of the endometrium no definitive evidence of metastatic disease in chest abdomen or pelvis she had some nonspecific small bilateral pulmonary nodules.  She otherwise is doing well specifically denies diarrhea dysuria or any other increased lower urinary tract symptoms.  She is seen today for radiation oncology opinion. PLANNED TREATMENT REGIMEN: Whole pelvic radiation  PAST MEDICAL HISTORY:  has a past medical history of Asthma, Brain tumor (benign) ([place]), Diabetes mellitus without complication ([place]), Early cataracts, bilateral, Environmental and seasonal allergies, GERD (gastroesophageal reflux disease), Hypertension, On home oxygen therapy, and Seizures ([place]).    PAST SURGICAL HISTORY:  Past Surgical History:  Procedure Laterality Date  . BRAIN SURGERY    . BREAST SURGERY     biopsy right breast  . CRANIOTOMY N/A 02/10/2015   Procedure: Removal of cranial plate with scalp wound revision;  Surgeon: Earnie Larsson, MD;  Location: [facility];  Service: Neurosurgery;  Laterality: N/A;  . DIAGNOSTIC LAPAROSCOPY     panniculectomy  . TONSILLECTOMY      FAMILY HISTORY: family history includes Cancer in her mother; Diabetes in an other family member; Heart failure in her father; Stroke  in her sister.  SOCIAL HISTORY:  reports that she has quit smoking. Her smoking use included cigarettes. She has never used smokeless tobacco. She reports that she does not drink alcohol and does not use drugs.  ALLERGIES: Mold extract  [trichophyton], Roxanol [morphine], Meperidine and related, Other, Propoxyphene, Tape, and Tramadol  MEDICATIONS:  Current Outpatient Medications  Medication Sig Dispense Refill  . aspirin EC 81 MG tablet Take 81 mg  by mouth daily.    Marland Kitchen azelastine (ASTELIN) 0.1 % nasal spray PLACE 1 SPRAY INTO EACH NOSTRIL DAILY ASDIRECTED. 30 mL 3  . Calcium Carb-Cholecalciferol (CALCIUM 600 + D) 600-200 MG-UNIT TABS Take 1 tablet by mouth daily.    . cetirizine (ZYRTEC) 10 MG tablet Take 10 mg by mouth daily.    Marland Kitchen docusate sodium (COLACE) 100 MG capsule Take 200 mg by mouth at bedtime.    Marland Kitchen esomeprazole (NEXIUM) 40 MG capsule Take 40 mg by mouth daily at 12 noon.    . fluocinonide (LIDEX) 0.05 % external solution APPLY 1 APPLICATION TOPICALLY TWICE DAILY AS NEEDED FOR INFECTION 60 mL 1  . fluticasone (FLONASE) 50 MCG/ACT nasal spray PLACE 1 SPRAY INTO BOTH NOSTRILS AT BEDTIME 16 g 0  . Fluticasone-Salmeterol (ADVAIR) 250-50 MCG/DOSE AEPB Inhale 1 puff into the lungs 2 (two) times daily. 3 each 4  . ketoconazole (NIZORAL) 2 % shampoo Apply 1 application topically 2 (two) times a week.    . meloxicam (MOBIC) 7.5 MG tablet Take 1 tablet (7.5 mg total) by mouth daily as needed for pain. 15 tablet 1  . montelukast (SINGULAIR) 10 MG tablet TAKE ONE TABLET BY MOUTH AT BEDTIME. 90 tablet 1  . Multiple Vitamins-Minerals (MULTIVITAMIN ADULT PO) Take 1 tablet by mouth daily.    Marland Kitchen oxybutynin (DITROPAN) 5 MG tablet TAKE (1) TABLET BY MOUTH EVERY DAY 90 tablet 1  . potassium citrate (UROCIT-K) 10 MEQ (1080 MG) SR tablet TAKE (1) TABLET BY MOUTH TWICE DAILY 180 tablet 1  . risedronate (ACTONEL) 35 MG tablet Take 1 tablet (35 mg total) by mouth every 7 (seven) days. with water on empty stomach, nothing by mouth or lie down for next 30 minutes. 4 tablet 3   No current facility-administered medications for this encounter.    ECOG PERFORMANCE STATUS:  0 - Asymptomatic  REVIEW OF SYSTEMS: Patient denies any weight loss, fatigue, weakness, fever, chills or night sweats. Patient denies any loss of vision, blurred vision. Patient denies any ringing  of the ears or hearing loss. No irregular heartbeat. Patient denies heart murmur or history of  fainting. Patient denies any chest pain or pain radiating to her upper extremities. Patient denies any shortness of breath, difficulty breathing at night, cough or hemoptysis. Patient denies any swelling in the lower legs. Patient denies any nausea vomiting, vomiting of blood, or coffee ground material in the vomitus. Patient denies any stomach pain. Patient states has had normal bowel movements no significant constipation or diarrhea. Patient denies any dysuria, hematuria or significant nocturia. Patient denies any problems walking, swelling in the joints or loss of balance. Patient denies any skin changes, loss of hair or loss of weight. Patient denies any excessive worrying or anxiety or significant depression. Patient denies any problems with insomnia. Patient denies excessive thirst, polyuria, polydipsia. Patient denies any swollen glands, patient denies easy bruising or easy bleeding. Patient denies any recent infections, allergies or URI. Patient "s visual fields have not changed significantly in recent time.   PHYSICAL EXAM: BP (!) 161/85 (  BP Location: Left Arm, Patient Position: Sitting)   Pulse 78   Temp (!) 97 F (36.1 C) (Tympanic)   Resp 18   Wt 246 lb 9.6 oz (111.9 kg)   BMI 46.59 kg/m  Patient is moderately obese.  Well-developed well-nourished patient in NAD. HEENT reveals PERLA, EOMI, discs not visualized.  Oral cavity is clear. No oral mucosal lesions are identified. Neck is clear without evidence of cervical or supraclavicular adenopathy. Lungs are clear to A&P. Cardiac examination is essentially unremarkable with regular rate and rhythm without murmur rub or thrill. Abdomen is benign with no organomegaly or masses noted. Motor sensory and DTR levels are equal and symmetric in the upper and lower extremities. Cranial nerves II through XII are grossly intact. Proprioception is intact. No peripheral adenopathy or edema is identified. No motor or sensory levels are noted. Crude visual  fields are within normal range.  LABORATORY DATA: Pathology report reviewed    RADIOLOGY RESULTS: CT scans reviewed   IMPRESSION: 1A clear cell carcinoma the endometrium status post TAH/BSO in 74 year old female  PLAN: At this time in consultation with Dr. Fransisca Connors he is requested we start whole pelvic radiation therapy.  Other options would be chemotherapy evaluation although Dr. Fransisca Connors is preferring whole pelvic radiation therapy and I will plan on delivering 4500 cGy in 25 fractions.  Risks and benefits of treatment occluding increased lower urinary tract symptoms diarrhea fatigue alteration of blood counts and skin reaction all were discussed in detail.  I will wait another type couple of weeks to do simulation to allow further healing of her incision.  Patient comprehends my treatment plan well.  I would like to take this opportunity to thank you for allowing me to participate in the care of your patient.Noreene Filbert, MD

## 2019-10-12 ENCOUNTER — Ambulatory Visit
Admission: RE | Admit: 2019-10-12 | Discharge: 2019-10-12 | Disposition: A | Discharge status: Home / Self Care | Payer: Medicare HMO | Source: Ambulatory Visit | Attending: Radiation Oncology | Admitting: Radiation Oncology

## 2019-10-12 DIAGNOSIS — C541 Malignant neoplasm of endometrium: Secondary | ICD-10-CM | POA: Diagnosis not present

## 2019-10-12 DIAGNOSIS — Z510 Encounter for antineoplastic radiation therapy: Secondary | ICD-10-CM | POA: Insufficient documentation

## 2019-10-15 ENCOUNTER — Other Ambulatory Visit: Payer: Self-pay

## 2019-10-15 DIAGNOSIS — Z510 Encounter for antineoplastic radiation therapy: Secondary | ICD-10-CM | POA: Diagnosis not present

## 2019-10-15 DIAGNOSIS — C541 Malignant neoplasm of endometrium: Secondary | ICD-10-CM | POA: Diagnosis not present

## 2019-10-16 ENCOUNTER — Other Ambulatory Visit: Payer: Self-pay | Admitting: *Deleted

## 2019-10-16 DIAGNOSIS — C541 Malignant neoplasm of endometrium: Secondary | ICD-10-CM

## 2019-10-19 ENCOUNTER — Ambulatory Visit: Admission: RE | Admit: 2019-10-19 | Payer: Medicare HMO | Source: Ambulatory Visit

## 2019-10-19 DIAGNOSIS — Z510 Encounter for antineoplastic radiation therapy: Secondary | ICD-10-CM | POA: Diagnosis not present

## 2019-10-19 DIAGNOSIS — C541 Malignant neoplasm of endometrium: Secondary | ICD-10-CM | POA: Diagnosis not present

## 2019-10-20 ENCOUNTER — Ambulatory Visit
Admission: RE | Admit: 2019-10-20 | Discharge: 2019-10-20 | Disposition: A | Discharge status: Home / Self Care | Payer: Medicare HMO | Source: Ambulatory Visit | Attending: Radiation Oncology | Admitting: Radiation Oncology

## 2019-10-20 DIAGNOSIS — Z510 Encounter for antineoplastic radiation therapy: Secondary | ICD-10-CM | POA: Diagnosis not present

## 2019-10-20 DIAGNOSIS — C541 Malignant neoplasm of endometrium: Secondary | ICD-10-CM | POA: Diagnosis not present

## 2019-10-21 ENCOUNTER — Other Ambulatory Visit: Payer: Medicare HMO

## 2019-10-21 ENCOUNTER — Ambulatory Visit
Admission: RE | Admit: 2019-10-21 | Discharge: 2019-10-21 | Disposition: A | Discharge status: Home / Self Care | Payer: Medicare HMO | Source: Ambulatory Visit | Attending: Radiation Oncology | Admitting: Radiation Oncology

## 2019-10-21 DIAGNOSIS — Z510 Encounter for antineoplastic radiation therapy: Secondary | ICD-10-CM | POA: Diagnosis not present

## 2019-10-21 DIAGNOSIS — C541 Malignant neoplasm of endometrium: Secondary | ICD-10-CM

## 2019-10-21 NOTE — Progress Notes (Signed)
Tumor Board Documentation  Cassidy Parsons was presented by Cassidy Clonts, RN at our Tumor Board on 10/21/2019, which included representatives from medical oncology, radiation oncology, surgical oncology, pathology, genetics.  Cassidy Parsons currently presents as a current patient with history of the following treatments: surgical intervention(s).  Additionally, we reviewed previous medical and familial history, history of present illness, and recent lab results along with all available histopathologic and imaging studies. The tumor board considered available treatment options and made the following recommendations: Adjuvant radiation (whole pelvic radiation)    The following procedures/referrals were also placed: No orders of the defined types were placed in this encounter.   Clinical Trial Status: not discussed   Staging used: AJCC Stage Group (FIGO 1a; outside called Clear Cell, confirmed at [facility]. Endometrioid on hysterectomy specimen.)  National site-specific guidelines   were discussed with respect to the case.  Tumor board is a meeting of clinicians from various specialty areas who evaluate and discuss patients for whom a multidisciplinary approach is being considered. Final determinations in the plan of care are those of the provider(s). The responsibility for follow up of recommendations given during tumor board is that of the provider.   Todays extended care, comprehensive team conference, Cassidy Parsons was not present for the discussion and was not examined.

## 2019-10-22 ENCOUNTER — Ambulatory Visit
Admission: RE | Admit: 2019-10-22 | Discharge: 2019-10-22 | Disposition: A | Discharge status: Home / Self Care | Payer: Medicare HMO | Source: Ambulatory Visit | Attending: Radiation Oncology | Admitting: Radiation Oncology

## 2019-10-22 DIAGNOSIS — C541 Malignant neoplasm of endometrium: Secondary | ICD-10-CM | POA: Diagnosis not present

## 2019-10-22 DIAGNOSIS — Z510 Encounter for antineoplastic radiation therapy: Secondary | ICD-10-CM | POA: Diagnosis not present

## 2019-10-23 ENCOUNTER — Ambulatory Visit
Admission: RE | Admit: 2019-10-23 | Discharge: 2019-10-23 | Disposition: A | Discharge status: Home / Self Care | Payer: Medicare HMO | Source: Ambulatory Visit | Attending: Radiation Oncology | Admitting: Radiation Oncology

## 2019-10-23 DIAGNOSIS — Z510 Encounter for antineoplastic radiation therapy: Secondary | ICD-10-CM | POA: Diagnosis not present

## 2019-10-26 ENCOUNTER — Ambulatory Visit
Admission: RE | Admit: 2019-10-26 | Discharge: 2019-10-26 | Disposition: A | Discharge status: Home / Self Care | Payer: Medicare HMO | Source: Ambulatory Visit | Attending: Radiation Oncology | Admitting: Radiation Oncology

## 2019-10-26 DIAGNOSIS — Z510 Encounter for antineoplastic radiation therapy: Secondary | ICD-10-CM | POA: Diagnosis not present

## 2019-10-26 DIAGNOSIS — C541 Malignant neoplasm of endometrium: Secondary | ICD-10-CM | POA: Diagnosis not present

## 2019-10-27 ENCOUNTER — Ambulatory Visit
Admission: RE | Admit: 2019-10-27 | Discharge: 2019-10-27 | Disposition: A | Discharge status: Home / Self Care | Payer: Medicare HMO | Source: Ambulatory Visit | Attending: Radiation Oncology | Admitting: Radiation Oncology

## 2019-10-27 DIAGNOSIS — C541 Malignant neoplasm of endometrium: Secondary | ICD-10-CM | POA: Diagnosis not present

## 2019-10-27 DIAGNOSIS — Z510 Encounter for antineoplastic radiation therapy: Secondary | ICD-10-CM | POA: Diagnosis not present

## 2019-10-28 ENCOUNTER — Other Ambulatory Visit: Payer: Self-pay | Admitting: Licensed Clinical Social Worker

## 2019-10-28 ENCOUNTER — Ambulatory Visit
Admission: RE | Admit: 2019-10-28 | Discharge: 2019-10-28 | Disposition: A | Discharge status: Home / Self Care | Payer: Medicare HMO | Source: Ambulatory Visit | Attending: Radiation Oncology | Admitting: Radiation Oncology

## 2019-10-28 DIAGNOSIS — T300 Burn of unspecified body region, unspecified degree: Secondary | ICD-10-CM

## 2019-10-28 DIAGNOSIS — C541 Malignant neoplasm of endometrium: Secondary | ICD-10-CM | POA: Diagnosis not present

## 2019-10-28 DIAGNOSIS — Z510 Encounter for antineoplastic radiation therapy: Secondary | ICD-10-CM | POA: Diagnosis not present

## 2019-10-28 MED ORDER — SILVER SULFADIAZINE 1 % EX CREA: 1.0000 "application " | TOPICAL_CREAM | Freq: Every day | CUTANEOUS | 0 refills | Status: DC

## 2019-10-29 ENCOUNTER — Ambulatory Visit
Admission: RE | Admit: 2019-10-29 | Discharge: 2019-10-29 | Disposition: A | Discharge status: Home / Self Care | Payer: Medicare HMO | Source: Ambulatory Visit | Attending: Radiation Oncology | Admitting: Radiation Oncology

## 2019-10-29 DIAGNOSIS — C541 Malignant neoplasm of endometrium: Secondary | ICD-10-CM | POA: Diagnosis not present

## 2019-10-29 DIAGNOSIS — Z510 Encounter for antineoplastic radiation therapy: Secondary | ICD-10-CM | POA: Diagnosis not present

## 2019-10-30 ENCOUNTER — Ambulatory Visit
Admission: RE | Admit: 2019-10-30 | Discharge: 2019-10-30 | Disposition: A | Discharge status: Home / Self Care | Payer: Medicare HMO | Source: Ambulatory Visit | Attending: Radiation Oncology | Admitting: Radiation Oncology

## 2019-10-30 ENCOUNTER — Other Ambulatory Visit: Payer: Self-pay

## 2019-10-30 ENCOUNTER — Inpatient Hospital Stay: Payer: Medicare HMO | Attending: Obstetrics and Gynecology

## 2019-10-30 DIAGNOSIS — Z510 Encounter for antineoplastic radiation therapy: Secondary | ICD-10-CM | POA: Diagnosis not present

## 2019-10-30 DIAGNOSIS — C541 Malignant neoplasm of endometrium: Secondary | ICD-10-CM | POA: Diagnosis not present

## 2019-10-30 LAB — CBC
HCT: 42.6 % (ref 36.0–46.0)
Hemoglobin: 13.7 g/dL (ref 12.0–15.0)
MCH: 28.5 pg (ref 26.0–34.0)
MCHC: 32.2 g/dL (ref 30.0–36.0)
MCV: 88.8 fL (ref 80.0–100.0)
Platelets: 257 10*3/uL (ref 150–400)
RBC: 4.8 MIL/uL (ref 3.87–5.11)
RDW: 14.9 % (ref 11.5–15.5)
WBC: 5 10*3/uL (ref 4.0–10.5)
nRBC: 0 % (ref 0.0–0.2)

## 2019-11-02 ENCOUNTER — Other Ambulatory Visit: Payer: Self-pay

## 2019-11-02 ENCOUNTER — Ambulatory Visit
Admission: RE | Admit: 2019-11-02 | Discharge: 2019-11-02 | Disposition: A | Discharge status: Home / Self Care | Payer: Medicare HMO | Source: Ambulatory Visit | Attending: Radiation Oncology | Admitting: Radiation Oncology

## 2019-11-02 ENCOUNTER — Ambulatory Visit: Payer: Medicare HMO | Admitting: Internal Medicine

## 2019-11-02 ENCOUNTER — Encounter: Payer: Self-pay | Admitting: Internal Medicine

## 2019-11-02 VITALS — BP 148/100 | HR 93 | Temp 98.3°F | Resp 16 | Ht 61.0 in | Wt 244.0 lb

## 2019-11-02 DIAGNOSIS — J4520 Mild intermittent asthma, uncomplicated: Secondary | ICD-10-CM | POA: Diagnosis not present

## 2019-11-02 DIAGNOSIS — G4730 Sleep apnea, unspecified: Secondary | ICD-10-CM | POA: Diagnosis not present

## 2019-11-02 DIAGNOSIS — C541 Malignant neoplasm of endometrium: Secondary | ICD-10-CM | POA: Diagnosis not present

## 2019-11-02 DIAGNOSIS — Z510 Encounter for antineoplastic radiation therapy: Secondary | ICD-10-CM | POA: Diagnosis not present

## 2019-11-02 DIAGNOSIS — R0602 Shortness of breath: Secondary | ICD-10-CM | POA: Diagnosis not present

## 2019-11-02 DIAGNOSIS — C55 Malignant neoplasm of uterus, part unspecified: Secondary | ICD-10-CM | POA: Diagnosis not present

## 2019-11-02 NOTE — Progress Notes (Signed)
[facility] [place], [place] [address]  Pulmonary Sleep Medicine   Office Visit Note  Patient Name: Cassidy Parsons DOB: [birth year] MRN [id number]  Date of Service: 11/02/2019  Complaints/HPI: Asthma ,Uterine cancer. She was recently diagnosed with aggressive uterine cancer. She is undergoing radiation therapy. States so far has been doing OK. She states her asthma has been under control. Has no flare ups of the asthma. Patient has no cough noted no fevers or chills noted. She has no chest pain noted.   ROS  General: (-) fever, (-) chills, (-) night sweats, (-) weakness Skin: (-) rashes, (-) itching,. Eyes: (-) visual changes, (-) redness, (-) itching. Nose and Sinuses: (-) nasal stuffiness or itchiness, (-) postnasal drip, (-) nosebleeds, (-) sinus trouble. Mouth and Throat: (-) sore throat, (-) hoarseness. Neck: (-) swollen glands, (-) enlarged thyroid, (-) neck pain. Respiratory: - cough, (-) bloody sputum, - shortness of breath, - wheezing. Cardiovascular: - ankle swelling, (-) chest pain. Lymphatic: (-) lymph node enlargement. Neurologic: (-) numbness, (-) tingling. Psychiatric: (-) anxiety, (-) depression   Current Medication: Outpatient Encounter Medications as of 11/02/2019  Medication Sig  . aspirin EC 81 MG tablet Take 81 mg by mouth daily.  Marland Kitchen azelastine (ASTELIN) 0.1 % nasal spray PLACE 1 SPRAY INTO EACH NOSTRIL DAILY ASDIRECTED.  . Calcium Carb-Cholecalciferol (CALCIUM 600 + D) 600-200 MG-UNIT TABS Take 1 tablet by mouth daily.  . cetirizine (ZYRTEC) 10 MG tablet Take 10 mg by mouth daily.  Marland Kitchen docusate sodium (COLACE) 100 MG capsule Take 200 mg by mouth at bedtime.  Marland Kitchen esomeprazole (NEXIUM) 40 MG capsule Take 40 mg by mouth daily at 12 noon.  . fluocinonide (LIDEX) 0.05 % external solution APPLY 1 APPLICATION TOPICALLY TWICE DAILY AS NEEDED FOR INFECTION  . fluticasone (FLONASE) 50 MCG/ACT nasal spray PLACE 1 SPRAY INTO BOTH NOSTRILS AT  BEDTIME  . Fluticasone-Salmeterol (ADVAIR) 250-50 MCG/DOSE AEPB Inhale 1 puff into the lungs 2 (two) times daily.  Marland Kitchen ketoconazole (NIZORAL) 2 % shampoo Apply 1 application topically 2 (two) times a week.  . meloxicam (MOBIC) 7.5 MG tablet Take 1 tablet (7.5 mg total) by mouth daily as needed for pain.  . montelukast (SINGULAIR) 10 MG tablet TAKE ONE TABLET BY MOUTH AT BEDTIME.  . Multiple Vitamins-Minerals (MULTIVITAMIN ADULT PO) Take 1 tablet by mouth daily.  Marland Kitchen oxybutynin (DITROPAN) 5 MG tablet TAKE (1) TABLET BY MOUTH EVERY DAY  . potassium citrate (UROCIT-K) 10 MEQ (1080 MG) SR tablet TAKE (1) TABLET BY MOUTH TWICE DAILY  . risedronate (ACTONEL) 35 MG tablet Take 1 tablet (35 mg total) by mouth every 7 (seven) days. with water on empty stomach, nothing by mouth or lie down for next 30 minutes.  . silver sulfADIAZINE (SILVADENE) 1 % cream Apply 1 application topically daily.   No facility-administered encounter medications on file as of 11/02/2019.    Surgical History: Past Surgical History:  Procedure Laterality Date  . BRAIN SURGERY    . BREAST SURGERY     biopsy right breast  . CRANIOTOMY N/A 02/10/2015   Procedure: Removal of cranial plate with scalp wound revision;  Surgeon: Earnie Larsson, MD;  Location: [facility];  Service: Neurosurgery;  Laterality: N/A;  . DIAGNOSTIC LAPAROSCOPY     panniculectomy  . PARTIAL HYSTERECTOMY    . TONSILLECTOMY      Medical History: Past Medical History:  Diagnosis Date  . Asthma   . Brain tumor (benign) ([place])    right side  .  Diabetes mellitus without complication (HCC)    borderline  . Early cataracts, bilateral   . Environmental and seasonal allergies   . GERD (gastroesophageal reflux disease)   . Hypertension    PMH:  . On home oxygen therapy    at night only  . Seizures ([place])    haven't had a seizure since brain surgery in 2001  . Uterine cancer ([place])     Family History: Family History  Problem Relation Age of Onset  .  Cancer Mother   . Heart failure Father   . Stroke Sister   . Diabetes Other     Social History: Social History   Socioeconomic History  . Marital status: Widowed    Spouse name: Not on file  . Number of children: Not on file  . Years of education: Not on file  . Highest education level: Not on file  Occupational History  . Not on file  Tobacco Use  . Smoking status: Former Smoker    Types: Cigarettes  . Smokeless tobacco: Never Used  . Tobacco comment: quit smoking cigarettes 34 years ago  Vaping Use  . Vaping Use: Never used  Substance and Sexual Activity  . Alcohol use: No  . Drug use: No  . Sexual activity: Not on file  Other Topics Concern  . Not on file  Social History Narrative  . Not on file   Social Determinants of Health   Financial Resource Strain:   . Difficulty of Paying Living Expenses: Not on file  Food Insecurity:   . Worried About Charity fundraiser in the Last Year: Not on file  . Ran Out of Food in the Last Year: Not on file  Transportation Needs:   . Lack of Transportation (Medical): Not on file  . Lack of Transportation (Non-Medical): Not on file  Physical Activity:   . Days of Exercise per Week: Not on file  . Minutes of Exercise per Session: Not on file  Stress:   . Feeling of Stress : Not on file  Social Connections:   . Frequency of Communication with Friends and Family: Not on file  . Frequency of Social Gatherings with Friends and Family: Not on file  . Attends Religious Services: Not on file  . Active Member of Clubs or Organizations: Not on file  . Attends Archivist Meetings: Not on file  . Marital Status: Not on file  Intimate Partner Violence:   . Fear of Current or Ex-Partner: Not on file  . Emotionally Abused: Not on file  . Physically Abused: Not on file  . Sexually Abused: Not on file    Vital Signs: Blood pressure (!) 148/100, pulse 93, temperature 98.3 F (36.8 C), resp. rate 16, height 5\' 1"  (1.549 m),  weight 244 lb (110.7 kg), SpO2 96 %.  Examination: General Appearance: The patient is well-developed, well-nourished, and in no distress. Skin: Gross inspection of skin unremarkable. Head: normocephalic, no gross deformities. Eyes: no gross deformities noted. ENT: ears appear grossly normal no exudates. Neck: Supple. No thyromegaly. No LAD. Respiratory: no rhonchi noted. Cardiovascular: Normal S1 and S2 without murmur or rub. Extremities: No cyanosis. pulses are equal. Neurologic: Alert and oriented. No involuntary movements.  LABS: Recent Results (from the past 2160 hour(s))  CBC     Status: None   Collection Time: 10/30/19 10:16 AM  Result Value Ref Range   WBC 5.0 4.0 - 10.5 K/uL   RBC 4.80 3.87 - 5.11 MIL/uL  Hemoglobin 13.7 12.0 - 15.0 g/dL   HCT 42.6 36 - 46 %   MCV 88.8 80.0 - 100.0 fL   MCH 28.5 26.0 - 34.0 pg   MCHC 32.2 30.0 - 36.0 g/dL   RDW 14.9 11.5 - 15.5 %   Platelets 257 150 - 400 K/uL   nRBC 0.0 0.0 - 0.2 %    Comment: Performed at [facility], [address] [place], [place] [address]    Radiology: No results found.  No results found.  No results found.    Assessment and Plan: Patient Active Problem List   Diagnosis Date Noted  . Primary clear cell adenocarcinoma of endometrium ([place]) 08/05/2019  . Urinary tract infection without hematuria 08/07/2018  . Dysuria 08/07/2018  . Low back pain 02/05/2017  . Mild persistent asthma, uncomplicated 86/75/4492  . Rhinitis, allergic 02/05/2017  . GAD (generalized anxiety disorder) 02/05/2017  . Unspecified complication of internal orthopedic prosthetic device, implant and graft, initial encounter ([place]) 02/05/2017  . Diabetes mellitus without complication ([place]) 01/00/7121  . Gout, unspecified 02/05/2017  . Occlusion and stenosis of bilateral carotid arteries 02/05/2017  . Insomnia, unspecified 02/05/2017  . Sleep apnea 02/05/2017  . Elevated blood-pressure reading without diagnosis of  hypertension 02/05/2017  . Dupuytren's disease of palm 10/05/2016  . Exposed orthopaedic hardware ([place]) 02/10/2015    1. Asthma under control will continue with present management 2. Uterine cancer being seen by oncology doing radiation therapy 3. Sleep Apnea stable at this time 4. HTN elevated BP noted  General Counseling: I have discussed the findings of the evaluation and examination with Horris Latino.  I have also discussed any further diagnostic evaluation thatmay be needed or ordered today. Caris verbalizes understanding of the findings of todays visit. We also reviewed her medications today and discussed drug interactions and side effects including but not limited excessive drowsiness and altered mental states. We also discussed that there is always a risk not just to her but also people around her. she has been encouraged to call the office with any questions or concerns that should arise related to todays visit.  Orders Placed This Encounter  Procedures  . Spirometry with Graph    Order Specific Question:   Where should this test be performed?    Answer:   [facility]    Order Specific Question:   Basic spirometry    Answer:   Yes    Order Specific Question:   Spirometry pre & post bronchodilator    Answer:   No     Time spent: 50  I have personally obtained a history, examined the patient, evaluated laboratory and imaging results, formulated the assessment and plan and placed orders.    Allyne Gee, MD [facility] Pulmonary and Critical Care Sleep medicine

## 2019-11-02 NOTE — Patient Instructions (Signed)
Asthma, Adult  Asthma is a long-term (chronic) condition in which the airways get tight and narrow. The airways are the breathing passages that lead from the nose and mouth down into the lungs. A person with asthma will have times when symptoms get worse. These are called asthma attacks. They can cause coughing, whistling sounds when you breathe (wheezing), shortness of breath, and chest pain. They can make it hard to breathe. There is no cure for asthma, but medicines and lifestyle changes can help control it. There are many things that can bring on an asthma attack or make asthma symptoms worse (triggers). Common triggers include:  Mold.  Dust.  Cigarette smoke.  Cockroaches.  Things that can cause allergy symptoms (allergens). These include animal skin flakes (dander) and pollen from trees or grass.  Things that pollute the air. These may include household cleaners, wood smoke, smog, or chemical odors.  Cold air, weather changes, and wind.  Crying or laughing hard.  Stress.  Certain medicines or drugs.  Certain foods such as dried fruit, potato chips, and grape juice.  Infections, such as a cold or the flu.  Certain medical conditions or diseases.  Exercise or tiring activities. Asthma may be treated with medicines and by staying away from the things that cause asthma attacks. Types of medicines may include:  Controller medicines. These help prevent asthma symptoms. They are usually taken every day.  Fast-acting reliever or rescue medicines. These quickly relieve asthma symptoms. They are used as needed and provide short-term relief.  Allergy medicines if your attacks are brought on by allergens.  Medicines to help control the body's defense (immune) system. Follow these instructions at home: Avoiding triggers in your home  Change your heating and air conditioning filter often.  Limit your use of fireplaces and wood stoves.  Get rid of pests (such as roaches and  mice) and their droppings.  Throw away plants if you see mold on them.  Clean your floors. Dust regularly. Use cleaning products that do not smell.  Have someone vacuum when you are not home. Use a vacuum cleaner with a HEPA filter if possible.  Replace carpet with wood, tile, or vinyl flooring. Carpet can trap animal skin flakes and dust.  Use allergy-proof pillows, mattress covers, and box spring covers.  Wash bed sheets and blankets every week in hot water. Dry them in a dryer.  Keep your bedroom free of any triggers.  Avoid pets and keep windows closed when things that cause allergy symptoms are in the air.  Use blankets that are made of polyester or cotton.  Clean bathrooms and kitchens with bleach. If possible, have someone repaint the walls in these rooms with mold-resistant paint. Keep out of the rooms that are being cleaned and painted.  Wash your hands often with soap and water. If soap and water are not available, use hand sanitizer.  Do not allow anyone to smoke in your home. General instructions  Take over-the-counter and prescription medicines only as told by your doctor. ? Talk with your doctor if you have questions about how or when to take your medicines. ? Make note if you need to use your medicines more often than usual.  Do not use any products that contain nicotine or tobacco, such as cigarettes and e-cigarettes. If you need help quitting, ask your doctor.  Stay away from secondhand smoke.  Avoid doing things outdoors when allergen counts are high and when air quality is low.  Wear a ski mask   when doing outdoor activities in the winter. The mask should cover your nose and mouth. Exercise indoors on cold days if you can.  Warm up before you exercise. Take time to cool down after exercise.  Use a peak flow meter as told by your doctor. A peak flow meter is a tool that measures how well the lungs are working.  Keep track of the peak flow meter's readings.  Write them down.  Follow your asthma action plan. This is a written plan for taking care of your asthma and treating your attacks.  Make sure you get all the shots (vaccines) that your doctor recommends. Ask your doctor about a flu shot and a pneumonia shot.  Keep all follow-up visits as told by your doctor. This is important. Contact a doctor if:  You have wheezing, shortness of breath, or a cough even while taking medicine to prevent attacks.  The mucus you cough up (sputum) is thicker than usual.  The mucus you cough up changes from clear or white to yellow, green, gray, or bloody.  You have problems from the medicine you are taking, such as: ? A rash. ? Itching. ? Swelling. ? Trouble breathing.  You need reliever medicines more than 2-3 times a week.  Your peak flow reading is still at 50-79% of your personal best after following the action plan for 1 hour.  You have a fever. Get help right away if:  You seem to be worse and are not responding to medicine during an asthma attack.  You are short of breath even at rest.  You get short of breath when doing very little activity.  You have trouble eating, drinking, or talking.  You have chest pain or tightness.  You have a fast heartbeat.  Your lips or fingernails start to turn blue.  You are light-headed or dizzy, or you faint.  Your peak flow is less than 50% of your personal best.  You feel too tired to breathe normally. Summary  Asthma is a long-term (chronic) condition in which the airways get tight and narrow. An asthma attack can make it hard to breathe.  Asthma cannot be cured, but medicines and lifestyle changes can help control it.  Make sure you understand how to avoid triggers and how and when to use your medicines. This information is not intended to replace advice given to you by your health care provider. Make sure you discuss any questions you have with your health care provider. Document Revised:  02/27/2018 Document Reviewed: 01/30/2016 Elsevier Patient Education  2020 [employer]  

## 2019-11-03 ENCOUNTER — Ambulatory Visit
Admission: RE | Admit: 2019-11-03 | Discharge: 2019-11-03 | Disposition: A | Discharge status: Home / Self Care | Payer: Medicare HMO | Source: Ambulatory Visit | Attending: Radiation Oncology | Admitting: Radiation Oncology

## 2019-11-03 DIAGNOSIS — C541 Malignant neoplasm of endometrium: Secondary | ICD-10-CM | POA: Diagnosis not present

## 2019-11-03 DIAGNOSIS — Z510 Encounter for antineoplastic radiation therapy: Secondary | ICD-10-CM | POA: Diagnosis not present

## 2019-11-04 ENCOUNTER — Ambulatory Visit
Admission: RE | Admit: 2019-11-04 | Discharge: 2019-11-04 | Disposition: A | Discharge status: Home / Self Care | Payer: Medicare HMO | Source: Ambulatory Visit | Attending: Radiation Oncology | Admitting: Radiation Oncology

## 2019-11-04 DIAGNOSIS — Z510 Encounter for antineoplastic radiation therapy: Secondary | ICD-10-CM | POA: Diagnosis not present

## 2019-11-04 DIAGNOSIS — C541 Malignant neoplasm of endometrium: Secondary | ICD-10-CM | POA: Diagnosis not present

## 2019-11-05 ENCOUNTER — Ambulatory Visit
Admission: RE | Admit: 2019-11-05 | Discharge: 2019-11-05 | Disposition: A | Discharge status: Home / Self Care | Payer: Medicare HMO | Source: Ambulatory Visit | Attending: Radiation Oncology | Admitting: Radiation Oncology

## 2019-11-05 DIAGNOSIS — C541 Malignant neoplasm of endometrium: Secondary | ICD-10-CM | POA: Diagnosis not present

## 2019-11-05 DIAGNOSIS — Z510 Encounter for antineoplastic radiation therapy: Secondary | ICD-10-CM | POA: Diagnosis not present

## 2019-11-06 ENCOUNTER — Other Ambulatory Visit: Payer: Self-pay

## 2019-11-06 ENCOUNTER — Inpatient Hospital Stay: Payer: Medicare HMO

## 2019-11-06 ENCOUNTER — Ambulatory Visit
Admission: RE | Admit: 2019-11-06 | Discharge: 2019-11-06 | Disposition: A | Discharge status: Home / Self Care | Payer: Medicare HMO | Source: Ambulatory Visit | Attending: Radiation Oncology | Admitting: Radiation Oncology

## 2019-11-06 DIAGNOSIS — Z510 Encounter for antineoplastic radiation therapy: Secondary | ICD-10-CM | POA: Diagnosis not present

## 2019-11-06 DIAGNOSIS — C541 Malignant neoplasm of endometrium: Secondary | ICD-10-CM | POA: Diagnosis not present

## 2019-11-06 LAB — CBC
HCT: 41.2 % (ref 36.0–46.0)
Hemoglobin: 13.4 g/dL (ref 12.0–15.0)
MCH: 28.9 pg (ref 26.0–34.0)
MCHC: 32.5 g/dL (ref 30.0–36.0)
MCV: 88.8 fL (ref 80.0–100.0)
Platelets: 185 10*3/uL (ref 150–400)
RBC: 4.64 MIL/uL (ref 3.87–5.11)
RDW: 15 % (ref 11.5–15.5)
WBC: 4.4 10*3/uL (ref 4.0–10.5)
nRBC: 0 % (ref 0.0–0.2)

## 2019-11-09 ENCOUNTER — Ambulatory Visit
Admission: RE | Admit: 2019-11-09 | Discharge: 2019-11-09 | Disposition: A | Discharge status: Home / Self Care | Payer: Medicare HMO | Source: Ambulatory Visit | Attending: Radiation Oncology | Admitting: Radiation Oncology

## 2019-11-09 DIAGNOSIS — Z510 Encounter for antineoplastic radiation therapy: Secondary | ICD-10-CM | POA: Insufficient documentation

## 2019-11-09 DIAGNOSIS — C541 Malignant neoplasm of endometrium: Secondary | ICD-10-CM | POA: Insufficient documentation

## 2019-11-10 ENCOUNTER — Ambulatory Visit
Admission: RE | Admit: 2019-11-10 | Discharge: 2019-11-10 | Disposition: A | Discharge status: Home / Self Care | Payer: Medicare HMO | Source: Ambulatory Visit | Attending: Radiation Oncology | Admitting: Radiation Oncology

## 2019-11-10 DIAGNOSIS — C541 Malignant neoplasm of endometrium: Secondary | ICD-10-CM | POA: Diagnosis not present

## 2019-11-10 DIAGNOSIS — Z510 Encounter for antineoplastic radiation therapy: Secondary | ICD-10-CM | POA: Diagnosis not present

## 2019-11-11 ENCOUNTER — Ambulatory Visit
Admission: RE | Admit: 2019-11-11 | Discharge: 2019-11-11 | Disposition: A | Discharge status: Home / Self Care | Payer: Medicare HMO | Source: Ambulatory Visit | Attending: Radiation Oncology | Admitting: Radiation Oncology

## 2019-11-11 DIAGNOSIS — Z510 Encounter for antineoplastic radiation therapy: Secondary | ICD-10-CM | POA: Diagnosis not present

## 2019-11-11 DIAGNOSIS — C541 Malignant neoplasm of endometrium: Secondary | ICD-10-CM | POA: Diagnosis not present

## 2019-11-12 ENCOUNTER — Ambulatory Visit
Admission: RE | Admit: 2019-11-12 | Discharge: 2019-11-12 | Disposition: A | Discharge status: Home / Self Care | Payer: Medicare HMO | Source: Ambulatory Visit | Attending: Radiation Oncology | Admitting: Radiation Oncology

## 2019-11-12 DIAGNOSIS — C541 Malignant neoplasm of endometrium: Secondary | ICD-10-CM | POA: Diagnosis not present

## 2019-11-12 DIAGNOSIS — Z510 Encounter for antineoplastic radiation therapy: Secondary | ICD-10-CM | POA: Diagnosis not present

## 2019-11-13 ENCOUNTER — Inpatient Hospital Stay: Payer: Medicare HMO | Attending: Obstetrics and Gynecology

## 2019-11-13 ENCOUNTER — Other Ambulatory Visit: Payer: Self-pay

## 2019-11-13 ENCOUNTER — Ambulatory Visit
Admission: RE | Admit: 2019-11-13 | Discharge: 2019-11-13 | Disposition: A | Discharge status: Home / Self Care | Payer: Medicare HMO | Source: Ambulatory Visit | Attending: Radiation Oncology | Admitting: Radiation Oncology

## 2019-11-13 DIAGNOSIS — C541 Malignant neoplasm of endometrium: Secondary | ICD-10-CM | POA: Diagnosis not present

## 2019-11-13 DIAGNOSIS — Z510 Encounter for antineoplastic radiation therapy: Secondary | ICD-10-CM | POA: Diagnosis not present

## 2019-11-13 LAB — CBC
HCT: 41.8 % (ref 36.0–46.0)
Hemoglobin: 13.5 g/dL (ref 12.0–15.0)
MCH: 29 pg (ref 26.0–34.0)
MCHC: 32.3 g/dL (ref 30.0–36.0)
MCV: 89.7 fL (ref 80.0–100.0)
Platelets: 195 10*3/uL (ref 150–400)
RBC: 4.66 MIL/uL (ref 3.87–5.11)
RDW: 15.5 % (ref 11.5–15.5)
WBC: 5.1 10*3/uL (ref 4.0–10.5)
nRBC: 0 % (ref 0.0–0.2)

## 2019-11-16 ENCOUNTER — Other Ambulatory Visit: Payer: Self-pay

## 2019-11-16 ENCOUNTER — Ambulatory Visit (INDEPENDENT_AMBULATORY_CARE_PROVIDER_SITE_OTHER): Payer: Medicare HMO | Admitting: Internal Medicine

## 2019-11-16 ENCOUNTER — Encounter: Payer: Self-pay | Admitting: Internal Medicine

## 2019-11-16 ENCOUNTER — Ambulatory Visit
Admission: RE | Admit: 2019-11-16 | Discharge: 2019-11-16 | Disposition: A | Discharge status: Home / Self Care | Payer: Medicare HMO | Source: Ambulatory Visit | Attending: Radiation Oncology | Admitting: Radiation Oncology

## 2019-11-16 DIAGNOSIS — J4520 Mild intermittent asthma, uncomplicated: Secondary | ICD-10-CM

## 2019-11-16 DIAGNOSIS — C541 Malignant neoplasm of endometrium: Secondary | ICD-10-CM | POA: Diagnosis not present

## 2019-11-16 DIAGNOSIS — Z0001 Encounter for general adult medical examination with abnormal findings: Secondary | ICD-10-CM | POA: Diagnosis not present

## 2019-11-16 DIAGNOSIS — E119 Type 2 diabetes mellitus without complications: Secondary | ICD-10-CM

## 2019-11-16 DIAGNOSIS — E1165 Type 2 diabetes mellitus with hyperglycemia: Secondary | ICD-10-CM | POA: Diagnosis not present

## 2019-11-16 DIAGNOSIS — Z510 Encounter for antineoplastic radiation therapy: Secondary | ICD-10-CM | POA: Diagnosis not present

## 2019-11-16 DIAGNOSIS — R300 Dysuria: Secondary | ICD-10-CM

## 2019-11-16 LAB — POCT GLYCOSYLATED HEMOGLOBIN (HGB A1C): Hemoglobin A1C: 7.1 % — AB (ref 4.0–5.6)

## 2019-11-16 NOTE — Progress Notes (Signed)
[facility] [place], [place] [address]  Internal MEDICINE  Office Visit Note  Patient Name: Cassidy Parsons  [id number]  [id number]  Date of Service: 11/19/2019  Chief Complaint  Patient presents with  . Medicare Wellness    discuss meds  . Asthma  . Diabetes  . Hypertension  . policy update form    received     HPI Pt is here for routine health maintenance examination. This has been tough year for her, she lost her husband and couple of close relatives. She also has been diagnosed to have uterine cancer, she already had surgery ( total hysterectomy ). She has been getting radiation treatment and has done well. Her hg A1c is slightly higher before. Pt has h/o chronic nephrolithiasis. She is on urocit-k for prevention. Her asthma is well controlled, advair is very expensive, will only use it as needed. Has been going to gym with no problems   Current Medication: Outpatient Encounter Medications as of 11/16/2019  Medication Sig  . aspirin EC 81 MG tablet Take 81 mg by mouth daily.  Marland Kitchen azelastine (ASTELIN) 0.1 % nasal spray PLACE 1 SPRAY INTO EACH NOSTRIL DAILY ASDIRECTED.  . Calcium Carb-Cholecalciferol (CALCIUM 600 + D) 600-200 MG-UNIT TABS Take 1 tablet by mouth daily.  . cetirizine (ZYRTEC) 10 MG tablet Take 10 mg by mouth daily.  Marland Kitchen docusate sodium (COLACE) 100 MG capsule Take 200 mg by mouth at bedtime.  Marland Kitchen esomeprazole (NEXIUM) 40 MG capsule Take 40 mg by mouth daily at 12 noon.  . fluocinonide (LIDEX) 0.05 % external solution APPLY 1 APPLICATION TOPICALLY TWICE DAILY AS NEEDED FOR INFECTION  . fluticasone (FLONASE) 50 MCG/ACT nasal spray PLACE 1 SPRAY INTO BOTH NOSTRILS AT BEDTIME  . Fluticasone-Salmeterol (ADVAIR) 250-50 MCG/DOSE AEPB Inhale 1 puff into the lungs 2 (two) times daily.  Marland Kitchen ketoconazole (NIZORAL) 2 % shampoo Apply 1 application topically 2 (two) times a week.  . meloxicam (MOBIC) 7.5 MG tablet Take 1 tablet (7.5 mg total) by mouth  daily as needed for pain.  . montelukast (SINGULAIR) 10 MG tablet TAKE ONE TABLET BY MOUTH AT BEDTIME.  . Multiple Vitamins-Minerals (MULTIVITAMIN ADULT PO) Take 1 tablet by mouth daily.  Marland Kitchen oxybutynin (DITROPAN) 5 MG tablet TAKE (1) TABLET BY MOUTH EVERY DAY  . potassium citrate (UROCIT-K) 10 MEQ (1080 MG) SR tablet TAKE (1) TABLET BY MOUTH TWICE DAILY  . risedronate (ACTONEL) 35 MG tablet Take 1 tablet (35 mg total) by mouth every 7 (seven) days. with water on empty stomach, nothing by mouth or lie down for next 30 minutes.  . silver sulfADIAZINE (SILVADENE) 1 % cream Apply 1 application topically daily.   No facility-administered encounter medications on file as of 11/16/2019.    Surgical History: Past Surgical History:  Procedure Laterality Date  . BRAIN SURGERY    . BREAST SURGERY     biopsy right breast  . CRANIOTOMY N/A 02/10/2015   Procedure: Removal of cranial plate with scalp wound revision;  Surgeon: Earnie Larsson, MD;  Location: [place] [facility] ORS;  Service: Neurosurgery;  Laterality: N/A;  . DIAGNOSTIC LAPAROSCOPY     panniculectomy  . PARTIAL HYSTERECTOMY    . TONSILLECTOMY      Medical History: Past Medical History:  Diagnosis Date  . Asthma   . Brain tumor (benign) ([place])    right side  . Diabetes mellitus without complication (HCC)    borderline  . Early cataracts, bilateral   . Environmental and seasonal  allergies   . GERD (gastroesophageal reflux disease)   . Hypertension    PMH:  . On home oxygen therapy    at night only  . Seizures ([place])    haven't had a seizure since brain surgery in 2001  . Uterine cancer ([place])     Family History: Family History  Problem Relation Age of Onset  . Cancer Mother   . Heart failure Father   . Stroke Sister   . Diabetes Other       Review of Systems  Constitutional: Negative for chills, diaphoresis and fatigue.  HENT: Negative for ear pain, postnasal drip and sinus pressure.   Eyes: Negative for photophobia, discharge,  redness, itching and visual disturbance.  Respiratory: Negative for cough, shortness of breath and wheezing.   Cardiovascular: Negative for chest pain, palpitations and leg swelling.  Gastrointestinal: Negative for abdominal pain, constipation, diarrhea, nausea and vomiting.  Genitourinary: Negative for dysuria and flank pain.  Musculoskeletal: Negative for arthralgias, back pain, gait problem and neck pain.  Skin: Negative for color change.       Well healed scar ( vertical)  Allergic/Immunologic: Negative for environmental allergies and food allergies.  Neurological: Negative for dizziness and headaches.  Hematological: Does not bruise/bleed easily.  Psychiatric/Behavioral: Negative for agitation, behavioral problems (depression) and hallucinations.     Vital Signs: BP (!) 148/82   Pulse 72   Temp (!) 97.3 F (36.3 C)   Resp 16   Ht 5\' 1"  (1.549 m)   Wt 243 lb 12.8 oz (110.6 kg)   SpO2 97%   BMI 46.07 kg/m    Physical Exam Constitutional:      General: She is not in acute distress.    Appearance: She is well-developed. She is not diaphoretic.  HENT:     Head: Normocephalic and atraumatic.     Right Ear: External ear normal.     Left Ear: External ear normal.     Nose: Nose normal.     Mouth/Throat:     Pharynx: No oropharyngeal exudate.  Eyes:     General: No scleral icterus.       Right eye: No discharge.        Left eye: No discharge.     Conjunctiva/sclera: Conjunctivae normal.     Pupils: Pupils are equal, round, and reactive to light.  Neck:     Thyroid: No thyromegaly.     Vascular: No JVD.     Trachea: No tracheal deviation.  Cardiovascular:     Rate and Rhythm: Normal rate and regular rhythm.     Pulses:          Dorsalis pedis pulses are 3+ on the right side and 3+ on the left side.       Posterior tibial pulses are 3+ on the right side and 3+ on the left side.     Heart sounds: Normal heart sounds. No murmur heard.  No friction rub. No gallop.    Pulmonary:     Effort: Pulmonary effort is normal. No respiratory distress.     Breath sounds: Normal breath sounds. No stridor. No wheezing or rales.  Chest:     Chest wall: No tenderness.     Breasts:        Right: Normal. No mass.        Left: Normal. No mass.  Abdominal:     General: Bowel sounds are normal. There is no distension.     Palpations: Abdomen is soft.  There is no mass.     Tenderness: There is no abdominal tenderness. There is no guarding or rebound.  Musculoskeletal:        General: No tenderness or deformity. Normal range of motion.     Cervical back: Normal range of motion and neck supple.     Right foot: Normal range of motion.     Left foot: Normal range of motion.  Feet:     Right foot:     Protective Sensation: 2 sites tested. 2 sites sensed.     Skin integrity: Skin integrity normal.     Toenail Condition: Right toenails are normal.     Left foot:     Protective Sensation: 2 sites tested. 2 sites sensed.     Skin integrity: Skin integrity normal.     Toenail Condition: Left toenails are normal.  Lymphadenopathy:     Cervical: No cervical adenopathy.  Skin:    General: Skin is warm and dry.     Coloration: Skin is not pale.     Findings: No erythema or rash.  Neurological:     Mental Status: She is alert and oriented to person, place, and time.     Cranial Nerves: No cranial nerve deficit.     Motor: No abnormal muscle tone.     Coordination: Coordination normal.     Deep Tendon Reflexes: Reflexes are normal and symmetric.  Psychiatric:        Behavior: Behavior normal.        Thought Content: Thought content normal.        Judgment: Judgment normal.      LABS: Recent Results (from the past 2160 hour(s))  CBC     Status: None   Collection Time: 10/30/19 10:16 AM  Result Value Ref Range   WBC 5.0 4.0 - 10.5 K/uL   RBC 4.80 3.87 - 5.11 MIL/uL   Hemoglobin 13.7 12.0 - 15.0 g/dL   HCT 42.6 36 - 46 %   MCV 88.8 80.0 - 100.0 fL   MCH 28.5  26.0 - 34.0 pg   MCHC 32.2 30.0 - 36.0 g/dL   RDW 14.9 11.5 - 15.5 %   Platelets 257 150 - 400 K/uL   nRBC 0.0 0.0 - 0.2 %    Comment: Performed at [facility], [address] [place], [place] [address]  CBC     Status: None   Collection Time: 11/06/19 10:17 AM  Result Value Ref Range   WBC 4.4 4.0 - 10.5 K/uL   RBC 4.64 3.87 - 5.11 MIL/uL   Hemoglobin 13.4 12.0 - 15.0 g/dL   HCT 41.2 36 - 46 %   MCV 88.8 80.0 - 100.0 fL   MCH 28.9 26.0 - 34.0 pg   MCHC 32.5 30.0 - 36.0 g/dL   RDW 15.0 11.5 - 15.5 %   Platelets 185 150 - 400 K/uL   nRBC 0.0 0.0 - 0.2 %    Comment: Performed at [facility], [address] [place], [place] [address]  CBC     Status: None   Collection Time: 11/13/19 10:24 AM  Result Value Ref Range   WBC 5.1 4.0 - 10.5 K/uL   RBC 4.66 3.87 - 5.11 MIL/uL   Hemoglobin 13.5 12.0 - 15.0 g/dL   HCT 41.8 36 - 46 %   MCV 89.7 80.0 - 100.0 fL   MCH 29.0 26.0 - 34.0 pg   MCHC 32.3 30.0 - 36.0 g/dL  RDW 15.5 11.5 - 15.5 %   Platelets 195 150 - 400 K/uL   nRBC 0.0 0.0 - 0.2 %    Comment: Performed at [facility], [address] [place], [place] [address]  POCT HgB A1C     Status: Abnormal   Collection Time: 11/16/19 11:53 AM  Result Value Ref Range   Hemoglobin A1C 7.1 (A) 4.0 - 5.6 %   HbA1c POC (<> result, manual entry)     HbA1c, POC (prediabetic range)     HbA1c, POC (controlled diabetic range)    UA/M w/rflx Culture, Routine     Status: Abnormal (Preliminary result)   Collection Time: 11/16/19  3:31 PM   Specimen: Urine   Urine  Result Value Ref Range   Specific Gravity, UA 1.017 1.005 - 1.030   pH, UA 7.0 5.0 - 7.5   Color, UA Yellow Yellow   Appearance Ur Clear Clear   Leukocytes,UA Trace (A) Negative   Protein,UA Negative Negative/Trace   Glucose, UA Negative Negative   Ketones, UA Negative Negative   RBC, UA Negative Negative   Bilirubin, UA Negative Negative   Urobilinogen, Ur 0.2 0.2 - 1.0 mg/dL   Nitrite, UA Negative  Negative   Microscopic Examination See below:     Comment: Microscopic was indicated and was performed.   Urinalysis Reflex Comment     Comment: This specimen has reflexed to a Urine Culture.  Microscopic Examination     Status: None   Collection Time: 11/16/19  3:31 PM   Urine  Result Value Ref Range   WBC, UA 0-5 0 - 5 /hpf   RBC None seen 0 - 2 /hpf   Epithelial Cells (non renal) None seen 0 - 10 /hpf   Casts None seen None seen /lpf   Bacteria, UA None seen None seen/Few  Urine Culture, Reflex     Status: None (Preliminary result)   Collection Time: 11/16/19  3:31 PM   Urine  Result Value Ref Range   Urine Culture, Routine WILL FOLLOW     Assessment/Plan: 1. Encounter for general adult medical examination with abnormal findings Update age appropriate PHM   2. Type 2 diabetes mellitus with hyperglycemia, without long-term current use of insulin (HCC) Her Hg A1c is elevated, pt ?? metformin is not in her med list. She is not sure. Will call us back  - POCT HgB A1C  3. Primary clear cell adenocarcinoma of endometrium ([place]) Pt had Hysterectomy and is getting radiation treatment now, has 5 more treatments left    4. Mild intermittent asthma without complication Pt is instructed to use Advair prn.  5. Dysuria - UA/M w/rflx Culture, Routine  General Counseling: Samyra verbalizes understanding of the findings of todays visit and agrees with plan of treatment. I have discussed any further diagnostic evaluation that may be needed or ordered today. We also reviewed her medications today. she has been encouraged to call the office with any questions or concerns that should arise related to todays visit.  Orders Placed This Encounter  Procedures  . Microscopic Examination  . Urine Culture, Reflex  . UA/M w/rflx Culture, Routine  . POCT HgB A1C   Total time spent: 35 Minutes  Time spent includes review of chart, medications, test results, and follow up plan with the patient.      Lavera Guise, MD  Internal Medicine

## 2019-11-17 ENCOUNTER — Ambulatory Visit
Admission: RE | Admit: 2019-11-17 | Discharge: 2019-11-17 | Disposition: A | Discharge status: Home / Self Care | Payer: Medicare HMO | Source: Ambulatory Visit | Attending: Radiation Oncology | Admitting: Radiation Oncology

## 2019-11-17 DIAGNOSIS — C541 Malignant neoplasm of endometrium: Secondary | ICD-10-CM | POA: Diagnosis not present

## 2019-11-17 DIAGNOSIS — Z510 Encounter for antineoplastic radiation therapy: Secondary | ICD-10-CM | POA: Diagnosis not present

## 2019-11-18 ENCOUNTER — Ambulatory Visit
Admission: RE | Admit: 2019-11-18 | Discharge: 2019-11-18 | Disposition: A | Discharge status: Home / Self Care | Payer: Medicare HMO | Source: Ambulatory Visit | Attending: Radiation Oncology | Admitting: Radiation Oncology

## 2019-11-18 DIAGNOSIS — Z510 Encounter for antineoplastic radiation therapy: Secondary | ICD-10-CM | POA: Diagnosis not present

## 2019-11-18 DIAGNOSIS — C541 Malignant neoplasm of endometrium: Secondary | ICD-10-CM | POA: Diagnosis not present

## 2019-11-19 ENCOUNTER — Ambulatory Visit
Admission: RE | Admit: 2019-11-19 | Discharge: 2019-11-19 | Disposition: A | Discharge status: Home / Self Care | Payer: Medicare HMO | Source: Ambulatory Visit | Attending: Radiation Oncology | Admitting: Radiation Oncology

## 2019-11-19 DIAGNOSIS — Z510 Encounter for antineoplastic radiation therapy: Secondary | ICD-10-CM | POA: Diagnosis not present

## 2019-11-19 DIAGNOSIS — C541 Malignant neoplasm of endometrium: Secondary | ICD-10-CM | POA: Diagnosis not present

## 2019-11-20 ENCOUNTER — Inpatient Hospital Stay: Payer: Medicare HMO

## 2019-11-20 ENCOUNTER — Ambulatory Visit
Admission: RE | Admit: 2019-11-20 | Discharge: 2019-11-20 | Disposition: A | Discharge status: Home / Self Care | Payer: Medicare HMO | Source: Ambulatory Visit | Attending: Radiation Oncology | Admitting: Radiation Oncology

## 2019-11-20 ENCOUNTER — Other Ambulatory Visit: Payer: Self-pay | Admitting: Licensed Clinical Social Worker

## 2019-11-20 ENCOUNTER — Other Ambulatory Visit: Payer: Self-pay

## 2019-11-20 ENCOUNTER — Other Ambulatory Visit: Payer: Self-pay | Admitting: *Deleted

## 2019-11-20 ENCOUNTER — Telehealth: Payer: Self-pay

## 2019-11-20 DIAGNOSIS — C541 Malignant neoplasm of endometrium: Secondary | ICD-10-CM

## 2019-11-20 DIAGNOSIS — Z510 Encounter for antineoplastic radiation therapy: Secondary | ICD-10-CM | POA: Diagnosis not present

## 2019-11-20 LAB — CBC
HCT: 40.4 % (ref 36.0–46.0)
Hemoglobin: 13.3 g/dL (ref 12.0–15.0)
MCH: 28.9 pg (ref 26.0–34.0)
MCHC: 32.9 g/dL (ref 30.0–36.0)
MCV: 87.8 fL (ref 80.0–100.0)
Platelets: 227 10*3/uL (ref 150–400)
RBC: 4.6 MIL/uL (ref 3.87–5.11)
RDW: 15.6 % — ABNORMAL HIGH (ref 11.5–15.5)
WBC: 4.7 10*3/uL (ref 4.0–10.5)
nRBC: 0 % (ref 0.0–0.2)

## 2019-11-21 LAB — URINE CULTURE, REFLEX

## 2019-11-21 LAB — UA/M W/RFLX CULTURE, ROUTINE
Bilirubin, UA: NEGATIVE
Glucose, UA: NEGATIVE
Ketones, UA: NEGATIVE
Nitrite, UA: NEGATIVE
Protein,UA: NEGATIVE
RBC, UA: NEGATIVE
Specific Gravity, UA: 1.017 (ref 1.005–1.030)
Urobilinogen, Ur: 0.2 mg/dL (ref 0.2–1.0)
pH, UA: 7 (ref 5.0–7.5)

## 2019-11-21 LAB — MICROSCOPIC EXAMINATION
Bacteria, UA: NONE SEEN
Casts: NONE SEEN /lpf
Epithelial Cells (non renal): NONE SEEN /hpf (ref 0–10)
RBC, Urine: NONE SEEN /hpf (ref 0–2)

## 2019-11-23 ENCOUNTER — Ambulatory Visit
Admission: RE | Admit: 2019-11-23 | Discharge: 2019-11-23 | Disposition: A | Discharge status: Home / Self Care | Payer: Medicare HMO | Source: Ambulatory Visit | Attending: Radiation Oncology | Admitting: Radiation Oncology

## 2019-11-23 DIAGNOSIS — C541 Malignant neoplasm of endometrium: Secondary | ICD-10-CM | POA: Diagnosis not present

## 2019-11-23 DIAGNOSIS — Z510 Encounter for antineoplastic radiation therapy: Secondary | ICD-10-CM | POA: Diagnosis not present

## 2019-12-02 ENCOUNTER — Ambulatory Visit: Payer: Medicare HMO | Admitting: Internal Medicine

## 2019-12-02 NOTE — Telephone Encounter (Signed)
done

## 2019-12-15 DIAGNOSIS — Z23 Encounter for immunization: Secondary | ICD-10-CM | POA: Diagnosis not present

## 2019-12-23 ENCOUNTER — Encounter: Payer: Self-pay | Admitting: Radiation Oncology

## 2019-12-23 ENCOUNTER — Inpatient Hospital Stay: Payer: Medicare HMO | Attending: Obstetrics and Gynecology | Admitting: Obstetrics and Gynecology

## 2019-12-23 ENCOUNTER — Ambulatory Visit
Admission: RE | Admit: 2019-12-23 | Discharge: 2019-12-23 | Disposition: A | Discharge status: Home / Self Care | Payer: Medicare HMO | Source: Ambulatory Visit | Attending: Radiation Oncology | Admitting: Radiation Oncology

## 2019-12-23 VITALS — BP 157/94 | HR 89 | Temp 97.2°F | Resp 20 | Wt 239.0 lb

## 2019-12-23 VITALS — BP 157/94 | HR 89 | Temp 97.2°F | Wt 239.0 lb

## 2019-12-23 DIAGNOSIS — M109 Gout, unspecified: Secondary | ICD-10-CM | POA: Diagnosis not present

## 2019-12-23 DIAGNOSIS — C541 Malignant neoplasm of endometrium: Secondary | ICD-10-CM | POA: Diagnosis not present

## 2019-12-23 DIAGNOSIS — Z791 Long term (current) use of non-steroidal anti-inflammatories (NSAID): Secondary | ICD-10-CM | POA: Diagnosis not present

## 2019-12-23 DIAGNOSIS — E1136 Type 2 diabetes mellitus with diabetic cataract: Secondary | ICD-10-CM | POA: Insufficient documentation

## 2019-12-23 DIAGNOSIS — R911 Solitary pulmonary nodule: Secondary | ICD-10-CM | POA: Diagnosis not present

## 2019-12-23 DIAGNOSIS — R918 Other nonspecific abnormal finding of lung field: Secondary | ICD-10-CM | POA: Insufficient documentation

## 2019-12-23 DIAGNOSIS — Z90721 Acquired absence of ovaries, unilateral: Secondary | ICD-10-CM | POA: Diagnosis not present

## 2019-12-23 DIAGNOSIS — Z6841 Body Mass Index (BMI) 40.0 and over, adult: Secondary | ICD-10-CM | POA: Insufficient documentation

## 2019-12-23 DIAGNOSIS — M1710 Unilateral primary osteoarthritis, unspecified knee: Secondary | ICD-10-CM | POA: Diagnosis not present

## 2019-12-23 DIAGNOSIS — F411 Generalized anxiety disorder: Secondary | ICD-10-CM | POA: Diagnosis not present

## 2019-12-23 DIAGNOSIS — Z923 Personal history of irradiation: Secondary | ICD-10-CM | POA: Insufficient documentation

## 2019-12-23 DIAGNOSIS — R69 Illness, unspecified: Secondary | ICD-10-CM | POA: Diagnosis not present

## 2019-12-23 DIAGNOSIS — Z90722 Acquired absence of ovaries, bilateral: Secondary | ICD-10-CM | POA: Insufficient documentation

## 2019-12-23 DIAGNOSIS — I6523 Occlusion and stenosis of bilateral carotid arteries: Secondary | ICD-10-CM | POA: Diagnosis not present

## 2019-12-23 DIAGNOSIS — I10 Essential (primary) hypertension: Secondary | ICD-10-CM | POA: Diagnosis not present

## 2019-12-23 DIAGNOSIS — Z90710 Acquired absence of both cervix and uterus: Secondary | ICD-10-CM | POA: Insufficient documentation

## 2019-12-23 DIAGNOSIS — Z79899 Other long term (current) drug therapy: Secondary | ICD-10-CM | POA: Insufficient documentation

## 2019-12-23 DIAGNOSIS — K219 Gastro-esophageal reflux disease without esophagitis: Secondary | ICD-10-CM | POA: Insufficient documentation

## 2019-12-23 DIAGNOSIS — Z87891 Personal history of nicotine dependence: Secondary | ICD-10-CM | POA: Insufficient documentation

## 2019-12-23 DIAGNOSIS — M720 Palmar fascial fibromatosis [Dupuytren]: Secondary | ICD-10-CM | POA: Insufficient documentation

## 2019-12-23 DIAGNOSIS — Z7982 Long term (current) use of aspirin: Secondary | ICD-10-CM | POA: Insufficient documentation

## 2019-12-23 NOTE — Progress Notes (Signed)
Gynecologic Oncology Consult Visit   Referring Provider: Dr. Kenton Kingfisher  Chief Complaint: 1A mixed clear cell/endometrioid endometrial carcinoma the endometrium status post TLH/BSO   Subjective:  Cassidy Parsons is a 74 y.o. female seen for surveillance of stage IA, mixed clear cell grade 2 endometrioid adenocarcinoma of the endometrium, s/p TLH-BSO, incompletely staged due to habitus and adhesive disease, followed by WPRT. Initial pathology from endometrial biopsy was reviewed at [facility] and confirmed clear cell carcinoma.   Returns today after completing WPRT 10/20/19-11/23/19. Suffered diarrhea and fatigue during treatment which has improved to resolve now. She continues to complain of urinary frequency unrelieved by oxybutynin.   [facility] pathology review endometrial biopsy DIAGNOSIS A. Endometrium, biopsy; Outside case, [phone], Tillie Rung. [place], [place]. Date of procedure 08-03-19: Clear cell carcinoma. See Diagnostic Comment. Electronically signed by Lelon Frohlich, MD on 09/11/2019 at 1046 . Diagnostic Comment The following immunohistochemistry was performed after review of the clinical history and morphology to further characterize the pathologic process. The results are as follows: A-4 P53 Abnormal/increased staining, likely indicative of a mutation A-5 P16 Patchy positive A-6 ER Dx Only Positive (approximately 60-70%, 2+) A-7 ER NEG Appropriately reactive A-8 HNF1-B positive A-9 P504S positive A-10 NAPSINA positive   Gyn Oncology history Patient has ~ 5 year history of PMB, worse since May 2021 after she fell on her stomach. Endorses abdominal pain radiates from left side around front to right pelvis and back. On 7/26 underwent endometrial biopsy.   Pap 08/03/2019- NILM  Pathology:  A. Endometrium, Biopsy:  - Clear cell carcinoma, FIGO grade 3   Husband died in 04-04-2022 and she delayed self care because she was taking care of him.   CT scan C/A/P  7/30 did not show metastatic disease.  Underwent TLH, BSO SLN mapping at [facility] 08/11/19.  Grade 2 endometrioid 4.2 cm cancer with 26% invasion.  Negative right adnexa and cervix and no LVSI.  Left adnexa could not be removed due to dense adhesions of sigmoid colon secondary to severe diverticular disease.   Comment: The tumor demonstrates foci of well-formed endometrioid glands with defined nuclear polarity, but a considerable portion of the tumor appears solid, with large areas of squamous differentiation.  Morphologic features of clear cell carcinoma, such as marked nuclear atypia, hobnail architecture, hyalinized stroma, and intracytoplasmic eosinophilic globules, are not identified.  Secretory change is present in some cells, which stain positive for the markers Napsin A and AMACR.  The reported history of a prior biopsy with clear cell carcinoma is noted.  Review of this biopsy may be beneficial to exclude the possibility of a mixed clear cell/endometrioid tumor, if this diagnosis would influence patient management.  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   Slides were reviewed at [facility]. Dr. Fransisca Connors recommended adjuvant WPRT.   Prediabetes, not on medications.  Problem List: Patient Active Problem List   Diagnosis Date Noted  . Primary clear cell adenocarcinoma of endometrium ([place]) 12/23/2019  . Morbid obesity with BMI of 45.0-49.9, adult ([place]) 08/12/2019  . Urinary tract infection without hematuria 08/07/2018  . Dysuria 08/07/2018  . Osteoarthritis of knee 04/11/2017  . Low back pain 02/05/2017  . Mild persistent asthma, uncomplicated 40/98/1191  . Rhinitis, allergic 02/05/2017  . GAD (generalized anxiety disorder) 02/05/2017  . Unspecified complication of internal orthopedic prosthetic device, implant and graft, initial encounter ([place]) 02/05/2017  . Diabetes mellitus without complication ([place]) 47/82/9562  . Gout, unspecified 02/05/2017  . Occlusion and  stenosis of bilateral carotid arteries 02/05/2017  . Insomnia, unspecified 02/05/2017  . Sleep apnea 02/05/2017  . Elevated blood-pressure reading without diagnosis of hypertension 02/05/2017  . Dupuytren's disease of palm 10/05/2016  . Exposed orthopaedic hardware ([place]) 02/10/2015    Past Medical History: Past Medical History:  Diagnosis Date  . Asthma   . Brain tumor (benign) ([place])    right side  . Diabetes mellitus without complication (HCC)    borderline  . Early cataracts, bilateral   . Environmental and seasonal allergies   . GERD (gastroesophageal reflux disease)   . Hypertension    PMH:  . On home oxygen therapy    at night only  . Seizures ([place])    haven't had a seizure since brain surgery in 2001  . Uterine cancer [facility])     Past Surgical History: Past Surgical History:  Procedure Laterality Date  . BRAIN SURGERY    . BREAST SURGERY     biopsy right breast  . CRANIOTOMY N/A 02/10/2015   Procedure: Removal of cranial plate with scalp wound revision;  Surgeon: Earnie Larsson, MD;  Location: [place] NEURO ORS;  Service: Neurosurgery;  Laterality: N/A;  . DIAGNOSTIC LAPAROSCOPY     panniculectomy  . PARTIAL HYSTERECTOMY    . TONSILLECTOMY      Past Gynecologic History:  Menarche: age [age] Post menopausal Post menopausal bleeding    OB History:  OB History  Gravida Para Term Preterm AB Living  $Remov'2 1 1   1 1  'iDCYXB$ SAB IAB Ectopic Multiple Live Births               # Outcome Date GA Lbr Len/2nd Weight Sex Delivery Anes PTL Lv  2 AB           1 Term             Family History: Family History  Problem Relation Age of Onset  . Cancer Mother   . Heart failure Father   . Stroke Sister   . Diabetes Other     Social History: Social History   Socioeconomic History  . Marital status: Widowed    Spouse name: Not on file  . Number of children: Not on file  . Years of education: Not on file  . Highest education level: Not on file  Occupational History  . Not on  file  Tobacco Use  . Smoking status: Former Smoker    Types: Cigarettes  . Smokeless tobacco: Never Used  . Tobacco comment: quit smoking cigarettes 34 years ago  Vaping Use  . Vaping Use: Never used  Substance and Sexual Activity  . Alcohol use: No  . Drug use: No  . Sexual activity: Not on file  Other Topics Concern  . Not on file  Social History Narrative  . Not on file   Social Determinants of Health   Financial Resource Strain: Not on file  Food Insecurity: Not on file  Transportation Needs: Not on file  Physical Activity: Not on file  Stress: Not on file  Social Connections: Not on file  Intimate Partner Violence: Not on file   Immunization History  Administered Date(s) Administered  . Influenza-Unspecified 10/22/2016, 09/21/2019  . Moderna Sars-Covid-2 Vaccination 03/02/2019, 03/31/2019  . Pneumococcal Conjugate-13 07/23/2015  . Zoster 10/16/2012   Allergies: Allergies  Allergen Reactions  . Mold Extract  [Trichophyton]   . Roxanol [Morphine] Other (See Comments)    Extreme pain  . Meperidine And Related Itching and Rash  .  Other Itching and Rash    All pain relievers except hydocodone Trees, mold, dust, and perfume allergies (sneezing, eyes swell up)  . Propoxyphene Itching and Rash  . Tape Itching and Rash    With paper tape.  Please use adhesive tape  . Tramadol Itching and Rash    Current Medications: Current Outpatient Medications  Medication Sig Dispense Refill  . aspirin EC 81 MG tablet Take 81 mg by mouth daily.    Marland Kitchen azelastine (ASTELIN) 0.1 % nasal spray PLACE 1 SPRAY INTO EACH NOSTRIL DAILY ASDIRECTED. 30 mL 3  . Calcium Carb-Cholecalciferol 600-200 MG-UNIT TABS Take 1 tablet by mouth daily.    . cetirizine (ZYRTEC) 10 MG tablet Take 10 mg by mouth daily.    Marland Kitchen docusate sodium (COLACE) 100 MG capsule Take 200 mg by mouth at bedtime.    Marland Kitchen esomeprazole (NEXIUM) 40 MG capsule Take 40 mg by mouth daily at 12 noon.    . fluocinonide (LIDEX) 0.05 %  external solution APPLY 1 APPLICATION TOPICALLY TWICE DAILY AS NEEDED FOR INFECTION 60 mL 1  . fluticasone (FLONASE) 50 MCG/ACT nasal spray PLACE 1 SPRAY INTO BOTH NOSTRILS AT BEDTIME 16 g 0  . Fluticasone-Salmeterol (ADVAIR) 250-50 MCG/DOSE AEPB Inhale 1 puff into the lungs 2 (two) times daily. 3 each 4  . ketoconazole (NIZORAL) 2 % shampoo Apply 1 application topically 2 (two) times a week.    . meloxicam (MOBIC) 7.5 MG tablet Take 1 tablet (7.5 mg total) by mouth daily as needed for pain. 15 tablet 1  . montelukast (SINGULAIR) 10 MG tablet TAKE ONE TABLET BY MOUTH AT BEDTIME. 90 tablet 1  . Multiple Vitamins-Minerals (MULTIVITAMIN ADULT PO) Take 1 tablet by mouth daily.    Marland Kitchen oxybutynin (DITROPAN) 5 MG tablet TAKE (1) TABLET BY MOUTH EVERY DAY 90 tablet 1  . potassium citrate (UROCIT-K) 10 MEQ (1080 MG) SR tablet TAKE (1) TABLET BY MOUTH TWICE DAILY 180 tablet 1  . risedronate (ACTONEL) 35 MG tablet Take 1 tablet (35 mg total) by mouth every 7 (seven) days. with water on empty stomach, nothing by mouth or lie down for next 30 minutes. 4 tablet 3  . silver sulfADIAZINE (SILVADENE) 1 % cream Apply 1 application topically daily. 50 g 0   No current facility-administered medications for this visit.   Review of Systems General:  no complaints Skin: no complaints Eyes: no complaints HEENT: no complaints Breasts: no complaints Pulmonary: no complaints Cardiac: no complaints Gastrointestinal: no complaints Genitourinary/Sexual: no complaints Ob/Gyn: no complaints Musculoskeletal: no complaints Hematology: no complaints Neurologic/Psych: no complaints   Objective:  Physical Examination:  BP (!) 157/94   Pulse 89   Temp (!) 97.2 F (36.2 C)   Resp 20   Wt 239 lb (108.4 kg)   SpO2 100%   BMI 45.16 kg/m     ECOG Performance Status: 1 - Symptomatic but completely ambulatory  GENERAL: Patient is a well appearing female in no acute distress. Obese.  HEENT:  Sclera clear.  Anicteric NODES:  Negative axillary, supraclavicular, inguinal lymph node survery LUNGS:  Clear to auscultation bilaterally.   HEART:  Regular rate and rhythm.  ABDOMEN:  Soft, nontender, nondistended.  No hernias, incisions well healed. No masses or ascites EXTREMITIES:  No peripheral edema. Atraumatic. No cyanosis SKIN:  Clear with no obvious rashes or skin changes.  NEURO:  Nonfocal. Well oriented.  Appropriate affect.  Pelvic: Exam chaperoned by CMA EGBUS: no lesions Vagina: vaginal cuff completely healed Cervix: surgically absent  Uterus: surgically absent BME: no palpable masses Rectal: deferred      Assessment:  ATARA PATERSON is a 74 y.o. female diagnosed with 1A mixed clear cell/endometrioid endometrial carcinoma (MSI; dMMR with loss of MLH1/PMS2 with methylated MLH1 alleles present) s/p TLH, RSO 08/11/19. SLN mapping unsuccessful and left adnexa could not be removed due to severe adhesions from diverticular disease.  No LVSI, or cervical/adnexal involvement and invades 23% of the myometrium.   Hypertension, asymptomatic  Pulmonary nodules, 07/2019 on CT chest  Medical co-morbidities complicating care: morbid obesity (Body mass index is 45.16 kg/m.), prediabetes, possible h/o carotid stenosis.  Plan:   Problem List Items Addressed This Visit      Genitourinary   Primary clear cell adenocarcinoma of endometrium ([place]) - Primary    Other Visit Diagnoses    Pulmonary nodules          I confirmed plan with Dr. Fransisca Connors. While [facility] review confirms clear cell carcinoma recommendation for WPRT and close surveillance. If she develops a recurrence than reassess for chemotherapy or biologic therapy.   Bilateral pulmonary nodules, 2-3 mm nonspecific on CT scan. Recommend repeat chest CT in 07/2020.   Will schedule RTC 3 months to see Dr. Fransisca Connors for continued surveillance.  Bakary Bramer Gaetana Michaelis, MD

## 2019-12-23 NOTE — Progress Notes (Signed)
Radiation Oncology Follow up Note  Name: Cassidy Parsons   Date:   12/23/2019 MRN:  [id number] DOB: 1945/08/06    This 74 y.o. 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  REFERRING PROVIDER: Lavera Guise, MD  HPI: Patient is a 74 year old female now out 1 month.  From whole pelvic radiation therapy for stage Ia clear cell carcinoma endometrium status post TAH/BSO.  Seen today in routine follow-up she is doing well she initially was having some subtle side effects including feeling extremely fatigued and frail.  That has subsided she specifically denies any increased lower urinary tract symptoms diarrhea or fatigue.  She is seeing Dr. Fransisca Connors today for pelvic examination.  She specifically denies any vaginal discharge or bleeding.  COMPLICATIONS OF TREATMENT: none  FOLLOW UP COMPLIANCE: keeps appointments   PHYSICAL EXAM:  BP (!) 157/94   Pulse 89   Temp (!) 97.2 F (36.2 C) (Tympanic)   Wt 239 lb (108.4 kg)   BMI 45.16 kg/m  Well-developed well-nourished patient in NAD. HEENT reveals PERLA, EOMI, discs not visualized.  Oral cavity is clear. No oral mucosal lesions are identified. Neck is clear without evidence of cervical or supraclavicular adenopathy. Lungs are clear to A&P. Cardiac examination is essentially unremarkable with regular rate and rhythm without murmur rub or thrill. Abdomen is benign with no organomegaly or masses noted. Motor sensory and DTR levels are equal and symmetric in the upper and lower extremities. Cranial nerves II through XII are grossly intact. Proprioception is intact. No peripheral adenopathy or edema is identified. No motor or sensory levels are noted. Crude visual fields are within normal range.  RADIOLOGY RESULTS: No current films to review  PLAN: Present time she has tolerated whole pelvic radiation therapy well with very little  residual side effects.  She continues close follow-up care and examination by Dr. Fransisca Connors which she will see today.  I have asked to see around 4 to 5 months for follow-up.  We will review any CT scans when they become available.  Patient knows to call with any concerns.  I would like to take this opportunity to thank you for allowing me to participate in the care of your patient.Noreene Filbert, MD

## 2019-12-28 ENCOUNTER — Ambulatory Visit (INDEPENDENT_AMBULATORY_CARE_PROVIDER_SITE_OTHER): Payer: Medicare HMO | Admitting: Hospice and Palliative Medicine

## 2019-12-28 ENCOUNTER — Other Ambulatory Visit: Payer: Self-pay

## 2019-12-28 ENCOUNTER — Encounter: Payer: Self-pay | Admitting: Hospice and Palliative Medicine

## 2019-12-28 VITALS — BP 124/88 | HR 75 | Temp 97.8°F | Resp 16 | Ht 61.0 in | Wt 238.2 lb

## 2019-12-28 DIAGNOSIS — G4734 Idiopathic sleep related nonobstructive alveolar hypoventilation: Secondary | ICD-10-CM

## 2019-12-28 DIAGNOSIS — J4520 Mild intermittent asthma, uncomplicated: Secondary | ICD-10-CM | POA: Diagnosis not present

## 2019-12-28 DIAGNOSIS — C55 Malignant neoplasm of uterus, part unspecified: Secondary | ICD-10-CM | POA: Diagnosis not present

## 2019-12-28 MED ORDER — AZELASTINE HCL 0.1 % NA SOLN: NASAL | 3 refills | Status: DC

## 2019-12-28 NOTE — Progress Notes (Signed)
[facility] [place], [place] [address]  Internal MEDICINE  Office Visit Note  Patient Name: Cassidy Parsons  [id number]  [id number]  Date of Service: 12/30/2019  Chief Complaint  Patient presents with  . Follow-up  . Hypertension  . Diabetes  . Gastroesophageal Reflux  . Quality Metric Gaps    PNA Vaccine + Eye Exam    HPI Patient is here for routine follow-up She is struggling today as the holidays are approaching and she has lost multiple family members this year including her husband She has also recently been diagnosed with uterine carcinoma, clear cell, stage IA Has completed whole pelvic radiation--scheduled to have repeat CT scan in 3 months and follow-up with oncology, they have discussed she may need prophylactic chemotherapy She is feeling well after completing radiation, continued to go to the gym daily for exercise class even while going through radiation History of asthma--followed by pulmonology, stable, no acute exacerbations, continues to use Advair a few times per week She is sleeping well at night--has support from other family members to get through these difficult times, she has not been using oxygen at night for quite some time-pulmonary provider aware   Current Medication: Outpatient Encounter Medications as of 12/28/2019  Medication Sig  . aspirin EC 81 MG tablet Take 81 mg by mouth daily.  . Calcium Carb-Cholecalciferol 600-200 MG-UNIT TABS Take 1 tablet by mouth daily.  . cetirizine (ZYRTEC) 10 MG tablet Take 10 mg by mouth daily.  Marland Kitchen docusate sodium (COLACE) 100 MG capsule Take 200 mg by mouth at bedtime.  Marland Kitchen esomeprazole (NEXIUM) 40 MG capsule Take 40 mg by mouth daily at 12 noon.  . fluocinonide (LIDEX) 0.05 % external solution APPLY 1 APPLICATION TOPICALLY TWICE DAILY AS NEEDED FOR INFECTION  . fluticasone (FLONASE) 50 MCG/ACT nasal spray PLACE 1 SPRAY INTO BOTH NOSTRILS AT BEDTIME  . Fluticasone-Salmeterol (ADVAIR) 250-50  MCG/DOSE AEPB Inhale 1 puff into the lungs 2 (two) times daily.  Marland Kitchen ketoconazole (NIZORAL) 2 % shampoo Apply 1 application topically 2 (two) times a week.  . meloxicam (MOBIC) 7.5 MG tablet Take 1 tablet (7.5 mg total) by mouth daily as needed for pain.  . montelukast (SINGULAIR) 10 MG tablet TAKE ONE TABLET BY MOUTH AT BEDTIME.  . Multiple Vitamins-Minerals (MULTIVITAMIN ADULT PO) Take 1 tablet by mouth daily.  Marland Kitchen oxybutynin (DITROPAN) 5 MG tablet TAKE (1) TABLET BY MOUTH EVERY DAY  . potassium citrate (UROCIT-K) 10 MEQ (1080 MG) SR tablet TAKE (1) TABLET BY MOUTH TWICE DAILY  . risedronate (ACTONEL) 35 MG tablet Take 1 tablet (35 mg total) by mouth every 7 (seven) days. with water on empty stomach, nothing by mouth or lie down for next 30 minutes.  . silver sulfADIAZINE (SILVADENE) 1 % cream Apply 1 application topically daily.  . [DISCONTINUED] azelastine (ASTELIN) 0.1 % nasal spray PLACE 1 SPRAY INTO EACH NOSTRIL DAILY ASDIRECTED.  Marland Kitchen azelastine (ASTELIN) 0.1 % nasal spray PLACE 1 SPRAY INTO EACH NOSTRIL DAILY ASDIRECTED.   No facility-administered encounter medications on file as of 12/28/2019.    Surgical History: Past Surgical History:  Procedure Laterality Date  . BRAIN SURGERY    . BREAST SURGERY     biopsy right breast  . CRANIOTOMY N/A 02/10/2015   Procedure: Removal of cranial plate with scalp wound revision;  Surgeon: Earnie Larsson, MD;  Location: [place] [facility];  Service: Neurosurgery;  Laterality: N/A;  . DIAGNOSTIC LAPAROSCOPY     panniculectomy  . PARTIAL HYSTERECTOMY    .  TONSILLECTOMY      Medical History: Past Medical History:  Diagnosis Date  . Asthma   . Brain tumor (benign) ([place])    right side  . Diabetes mellitus without complication (HCC)    borderline  . Early cataracts, bilateral   . Environmental and seasonal allergies   . GERD (gastroesophageal reflux disease)   . Hypertension    PMH:  . On home oxygen therapy    pt is no longer  using oxygen at night  .  Seizures ([place])    haven't had a seizure since brain surgery in 2001  . Uterine cancer ([place])     Family History: Family History  Problem Relation Age of Onset  . Cancer Mother   . Heart failure Father   . Stroke Sister   . Diabetes Other     Social History   Socioeconomic History  . Marital status: Widowed    Spouse name: Not on file  . Number of children: Not on file  . Years of education: Not on file  . Highest education level: Not on file  Occupational History  . Not on file  Tobacco Use  . Smoking status: Former Smoker    Types: Cigarettes  . Smokeless tobacco: Never Used  . Tobacco comment: quit smoking cigarettes 34 years ago  Vaping Use  . Vaping Use: Never used  Substance and Sexual Activity  . Alcohol use: No  . Drug use: No  . Sexual activity: Not on file  Other Topics Concern  . Not on file  Social History Narrative  . Not on file   Social Determinants of Health   Financial Resource Strain: Not on file  Food Insecurity: Not on file  Transportation Needs: Not on file  Physical Activity: Not on file  Stress: Not on file  Social Connections: Not on file  Intimate Partner Violence: Not on file      Review of Systems  Constitutional: Negative for chills, diaphoresis and fatigue.  HENT: Negative for ear pain, postnasal drip and sinus pressure.   Eyes: Negative for photophobia, discharge, redness, itching and visual disturbance.  Respiratory: Negative for cough, shortness of breath and wheezing.   Cardiovascular: Negative for chest pain, palpitations and leg swelling.  Gastrointestinal: Negative for abdominal pain, constipation, diarrhea, nausea and vomiting.  Genitourinary: Negative for dysuria and flank pain.  Musculoskeletal: Negative for arthralgias, back pain, gait problem and neck pain.  Skin: Negative for color change.  Allergic/Immunologic: Negative for environmental allergies and food allergies.  Neurological: Negative for dizziness and  headaches.  Hematological: Does not bruise/bleed easily.  Psychiatric/Behavioral: Negative for agitation, behavioral problems (depression) and hallucinations.    Vital Signs: BP 124/88   Pulse 75   Temp 97.8 F (36.6 C)   Resp 16   Ht 5\' 1"  (1.549 m)   Wt 238 lb 3.2 oz (108 kg)   SpO2 96%   BMI 45.01 kg/m    Physical Exam Vitals reviewed.  Constitutional:      Appearance: Normal appearance. She is obese.  Cardiovascular:     Rate and Rhythm: Normal rate and regular rhythm.     Pulses: Normal pulses.     Heart sounds: Normal heart sounds.  Pulmonary:     Effort: Pulmonary effort is normal.     Breath sounds: Normal breath sounds.  Abdominal:     General: Abdomen is flat.  Musculoskeletal:        General: Normal range of motion.  Cervical back: Normal range of motion.  Skin:    General: Skin is warm.  Neurological:     General: No focal deficit present.     Mental Status: She is alert and oriented to person, place, and time. Mental status is at baseline.  Psychiatric:        Mood and Affect: Mood normal.        Behavior: Behavior normal.        Thought Content: Thought content normal.        Judgment: Judgment normal.    Assessment/Plan: 1. Nocturnal hypoxia Will need to reassess as she has not been wearing her oxygen for over a year, has not yet been retested, she would like to discuss this after the holidays  2. Malignant neoplasm of uterus, unspecified site [facility]) Completed radiation therapy--follow-up scheduled with oncology, may require chemotherapy in the New Year  3. Mild intermittent asthma without complication Symptoms remain well controlled on current therapy, requesting refills of Astelin nasal spray, uses as needed for nasal congestion - azelastine (ASTELIN) 0.1 % nasal spray; PLACE 1 SPRAY INTO EACH NOSTRIL DAILY ASDIRECTED.  Dispense: 30 mL; Refill: 3  4. Morbid obesity ([place]) Discussed importance of weight loss therapy--praised for her  commitment to continuing to attend exercise classes even through radiation treatments Discussed importance of following a healthy diet and avoid snacking late in the evenings to help with weight loss  General Counseling: Robertine verbalizes understanding of the findings of todays visit and agrees with plan of treatment. I have discussed any further diagnostic evaluation that may be needed or ordered today. We also reviewed her medications today. she has been encouraged to call the office with any questions or concerns that should arise related to todays visit.  Meds ordered this encounter  Medications  . azelastine (ASTELIN) 0.1 % nasal spray    Sig: PLACE 1 SPRAY INTO EACH NOSTRIL DAILY ASDIRECTED.    Dispense:  30 mL    Refill:  3    Time spent: 30 Minutes Time spent includes review of chart, medications, test results and follow-up plan with the patient.  This patient was seen by Theodoro Grist AGNP-C in Collaboration with Dr Lavera Guise as a part of collaborative care agreement     Tanna Furry. Kissie Ziolkowski AGNP-C Internal medicine

## 2019-12-30 ENCOUNTER — Encounter: Payer: Self-pay | Admitting: Hospice and Palliative Medicine

## 2019-12-31 ENCOUNTER — Telehealth: Payer: Self-pay | Admitting: Obstetrics and Gynecology

## 2019-12-31 NOTE — Telephone Encounter (Signed)
I called Ms. Cassidy Parsons. I confirmed plan with Dr. Fransisca Connors. While [facility] review confirms clear cell carcinoma recommendation for WPRT and close surveillance. If she develops a recurrence than reassess for chemotherapy or biologic therapy. She agrees with this plan.  Johnda Billiot Gaetana Michaelis, MD

## 2020-01-04 ENCOUNTER — Other Ambulatory Visit: Payer: Self-pay

## 2020-01-04 DIAGNOSIS — J309 Allergic rhinitis, unspecified: Secondary | ICD-10-CM

## 2020-01-04 MED ORDER — FLUTICASONE PROPIONATE 50 MCG/ACT NA SUSP: 1.0000 | Freq: Every day | NASAL | 3 refills | Status: DC

## 2020-02-06 ENCOUNTER — Other Ambulatory Visit: Payer: Self-pay | Admitting: Dermatology

## 2020-02-06 ENCOUNTER — Other Ambulatory Visit: Payer: Self-pay | Admitting: Adult Health

## 2020-02-09 ENCOUNTER — Other Ambulatory Visit: Payer: Self-pay | Admitting: Hospice and Palliative Medicine

## 2020-02-11 ENCOUNTER — Other Ambulatory Visit: Payer: Self-pay

## 2020-02-11 MED ORDER — KETOCONAZOLE 2 % EX SHAM: 1.0000 "application " | MEDICATED_SHAMPOO | CUTANEOUS | 1 refills | Status: DC

## 2020-02-11 NOTE — Telephone Encounter (Signed)
Pt called that we can refills her shampoo

## 2020-02-23 ENCOUNTER — Other Ambulatory Visit: Payer: Self-pay

## 2020-02-23 MED ORDER — MONTELUKAST SODIUM 10 MG PO TABS: 10.0000 mg | ORAL_TABLET | Freq: Every day | ORAL | 1 refills | Status: DC

## 2020-03-23 ENCOUNTER — Telehealth: Payer: Self-pay | Admitting: Obstetrics and Gynecology

## 2020-03-23 ENCOUNTER — Inpatient Hospital Stay: Payer: Medicare HMO | Attending: Obstetrics and Gynecology | Admitting: Obstetrics and Gynecology

## 2020-03-23 VITALS — BP 163/96 | HR 93 | Temp 97.8°F | Resp 20 | Wt 242.0 lb

## 2020-03-23 DIAGNOSIS — Z90722 Acquired absence of ovaries, bilateral: Secondary | ICD-10-CM | POA: Insufficient documentation

## 2020-03-23 DIAGNOSIS — E1136 Type 2 diabetes mellitus with diabetic cataract: Secondary | ICD-10-CM | POA: Insufficient documentation

## 2020-03-23 DIAGNOSIS — Z90710 Acquired absence of both cervix and uterus: Secondary | ICD-10-CM | POA: Diagnosis not present

## 2020-03-23 DIAGNOSIS — C541 Malignant neoplasm of endometrium: Secondary | ICD-10-CM

## 2020-03-23 DIAGNOSIS — Z8542 Personal history of malignant neoplasm of other parts of uterus: Secondary | ICD-10-CM | POA: Diagnosis not present

## 2020-03-23 NOTE — Progress Notes (Signed)
Gynecologic Oncology Consult Visit   Referring Provider: Dr. Kenton Kingfisher  Chief Complaint: 1A mixed clear cell/endometrioid endometrial carcinoma the endometrium status post TLH/BSO, radiation  Subjective:  Cassidy Parsons is a 75 y.o. female seen for surveillance of stage IA, mixed clear cell grade 2 endometrioid adenocarcinoma of the endometrium, s/p TLH-BSO, incompletely staged due to habitus and adhesive disease, followed by WPRT. Initial pathology from endometrial biopsy was reviewed at [facility] and confirmed clear cell carcinoma.   Returns today after completing WPRT 10/20/19-11/23/19. Suffered diarrhea and fatigue during treatment which has improved to resolve now. She continues to complain of urinary frequency unrelieved by oxybutynin.   [facility] pathology review endometrial biopsy DIAGNOSIS A. Endometrium, biopsy; Outside case, [phone], Tillie Rung. [place], [place]. Date of procedure 08-03-19: Clear cell carcinoma. See Diagnostic Comment. Electronically signed by Lelon Frohlich, MD on 09/11/2019 at 1046 . Diagnostic Comment The following immunohistochemistry was performed after review of the clinical history and morphology to further characterize the pathologic process. The results are as follows: A-4 P53 Abnormal/increased staining, likely indicative of a mutation A-5 P16 Patchy positive A-6 ER Dx Only Positive (approximately 60-70%, 2+) A-7 ER NEG Appropriately reactive A-8 HNF1-B positive A-9 P504S positive A-10 NAPSIN A positive  Gyn Oncology history Patient has ~ 5 year history of PMB, worse since May 2021 after she fell on her stomach. Endorses abdominal pain radiates from left side around front to right pelvis and back. On 7/26 underwent endometrial biopsy.   Pap 08/03/2019- NILM  Pathology:  A. Endometrium, Biopsy:  - Clear cell carcinoma, FIGO grade 3   Husband died in 03/28/2022 and she delayed self care because she was taking care of him.   CT  scan C/A/P 08/07/19 did not show metastatic disease. Impression:  1. Heterogeneous and thickened appearance of the endometrium in keeping with patients known primary malignancy.  2. No definite evidence of metastatic disease in the chest, abdomen, or pelvis.  3. Bilateral pulmonary nodules, nonspecific. Recommend continued attention on follow-up.  4. Mild thickening of the sigmoid colon and tethering of the superior bladder wall, favored sequela of prior diverticulitis.   Underwent TLH, BSO SLN mapping at [facility] 08/11/19.  Grade 2 endometrioid 4.2 cm cancer with 26% invasion.  Negative right adnexa and cervix and no LVSI.  Left adnexa could not be removed due to dense adhesions of sigmoid colon secondary to severe diverticular disease.   Comment: The tumor demonstrates foci of well-formed endometrioid glands with defined nuclear polarity, but a considerable portion of the tumor appears solid, with large areas of squamous differentiation.  Morphologic features of clear cell carcinoma, such as marked nuclear atypia, hobnail architecture, hyalinized stroma, and intracytoplasmic eosinophilic globules, are not identified.  Secretory change is present in some cells, which stain positive for the markers Napsin A and AMACR.  The reported history of a prior biopsy with clear cell carcinoma is noted.  Review of this biopsy may be beneficial to exclude the possibility of a mixed clear cell/endometrioid tumor, if this diagnosis would influence patient management.  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   Slides were reviewed at [facility]. Dr. Fransisca Connors recommended adjuvant WPRT.   Prediabetes, not on medications.  Problem List: Patient Active Problem List   Diagnosis Date Noted  . Primary clear cell adenocarcinoma of endometrium ([place]) 12/23/2019  . Morbid obesity with BMI of 45.0-49.9, adult ([place]) 08/12/2019  . Urinary tract infection without hematuria 08/07/2018  . Dysuria 08/07/2018   .  Osteoarthritis of knee 04/11/2017  . Low back pain 02/05/2017  . Mild persistent asthma, uncomplicated 19/14/7829  . Rhinitis, allergic 02/05/2017  . GAD (generalized anxiety disorder) 02/05/2017  . Unspecified complication of internal orthopedic prosthetic device, implant and graft, initial encounter ([place]) 02/05/2017  . Diabetes mellitus without complication ([place]) 56/21/3086  . Gout, unspecified 02/05/2017  . Occlusion and stenosis of bilateral carotid arteries 02/05/2017  . Insomnia, unspecified 02/05/2017  . Sleep apnea 02/05/2017  . Elevated blood-pressure reading without diagnosis of hypertension 02/05/2017  . Dupuytren's disease of palm 10/05/2016  . Exposed orthopaedic hardware ([place]) 02/10/2015    Past Medical History: Past Medical History:  Diagnosis Date  . Asthma   . Brain tumor (benign) ([place])    right side  . Diabetes mellitus without complication (HCC)    borderline  . Early cataracts, bilateral   . Environmental and seasonal allergies   . GERD (gastroesophageal reflux disease)   . Hypertension    PMH:  . On home oxygen therapy    pt is no longer  using oxygen at night  . Seizures ([place])    haven't had a seizure since brain surgery in 2001  . Uterine cancer [facility])     Past Surgical History: Past Surgical History:  Procedure Laterality Date  . BRAIN SURGERY    . BREAST SURGERY     biopsy right breast  . CRANIOTOMY N/A 02/10/2015   Procedure: Removal of cranial plate with scalp wound revision;  Surgeon: Earnie Larsson, MD;  Location: [place] NEURO ORS;  Service: Neurosurgery;  Laterality: N/A;  . DIAGNOSTIC LAPAROSCOPY     panniculectomy  . PARTIAL HYSTERECTOMY    . TONSILLECTOMY      Past Gynecologic History:  Menarche: age [age] Post menopausal Post menopausal bleeding    OB History:  OB History  Gravida Para Term Preterm AB Living  $Remov'2 1 1   1 1  'pQWRDJ$ SAB IAB Ectopic Multiple Live Births               # Outcome Date GA Lbr Len/2nd Weight Sex Delivery Anes  PTL Lv  2 AB           1 Term             Family History: Family History  Problem Relation Age of Onset  . Cancer Mother   . Heart failure Father   . Stroke Sister   . Diabetes Other     Social History: Social History   Socioeconomic History  . Marital status: Widowed    Spouse name: Not on file  . Number of children: Not on file  . Years of education: Not on file  . Highest education level: Not on file  Occupational History  . Not on file  Tobacco Use  . Smoking status: Former Smoker    Types: Cigarettes  . Smokeless tobacco: Never Used  . Tobacco comment: quit smoking cigarettes 34 years ago  Vaping Use  . Vaping Use: Never used  Substance and Sexual Activity  . Alcohol use: No  . Drug use: No  . Sexual activity: Not on file  Other Topics Concern  . Not on file  Social History Narrative  . Not on file   Social Determinants of Health   Financial Resource Strain: Not on file  Food Insecurity: Not on file  Transportation Needs: Not on file  Physical Activity: Not on file  Stress: Not on file  Social Connections: Not on file  Intimate Partner  Violence: Not on file   Immunization History  Administered Date(s) Administered  . Influenza-Unspecified 10/22/2016, 09/21/2019  . Moderna Sars-Covid-2 Vaccination 03/02/2019, 03/31/2019  . Pneumococcal Conjugate-13 07/23/2015  . Zoster 10/16/2012   Allergies: Allergies  Allergen Reactions  . Mold Extract  [Trichophyton]   . Roxanol [Morphine] Other (See Comments)    Extreme pain  . Meperidine And Related Itching and Rash  . Other Itching and Rash    All pain relievers except hydocodone Trees, mold, dust, and perfume allergies (sneezing, eyes swell up)  . Propoxyphene Itching and Rash  . Tape Itching and Rash    With paper tape.  Please use adhesive tape  . Tramadol Itching and Rash    Current Medications: Current Outpatient Medications  Medication Sig Dispense Refill  . aspirin EC 81 MG tablet Take 81  mg by mouth daily.    Marland Kitchen azelastine (ASTELIN) 0.1 % nasal spray PLACE 1 SPRAY INTO EACH NOSTRIL DAILY ASDIRECTED. 30 mL 3  . Calcium Carb-Cholecalciferol 600-200 MG-UNIT TABS Take 1 tablet by mouth daily.    . cetirizine (ZYRTEC) 10 MG tablet Take 10 mg by mouth daily.    Marland Kitchen docusate sodium (COLACE) 100 MG capsule Take 200 mg by mouth at bedtime.    Marland Kitchen esomeprazole (NEXIUM) 40 MG capsule Take 40 mg by mouth daily at 12 noon.    . fluocinonide (LIDEX) 0.05 % external solution APPLY 1 APPLICATION TOPICALLY TWICE DAILY AS NEEDED FOR INFECTION 60 mL 1  . fluticasone (FLONASE) 50 MCG/ACT nasal spray Place 1 spray into both nostrils at bedtime. 16 g 3  . Fluticasone-Salmeterol (ADVAIR) 250-50 MCG/DOSE AEPB Inhale 1 puff into the lungs 2 (two) times daily. 3 each 4  . ketoconazole (NIZORAL) 2 % shampoo Apply 1 application topically 2 (two) times a week. 120 mL 1  . meloxicam (MOBIC) 7.5 MG tablet Take 1 tablet (7.5 mg total) by mouth daily as needed for pain. 15 tablet 1  . montelukast (SINGULAIR) 10 MG tablet Take 1 tablet (10 mg total) by mouth at bedtime. 90 tablet 1  . Multiple Vitamins-Minerals (MULTIVITAMIN ADULT PO) Take 1 tablet by mouth daily.    Marland Kitchen oxybutynin (DITROPAN) 5 MG tablet TAKE (1) TABLET BY MOUTH EVERY DAY 90 tablet 1  . potassium citrate (UROCIT-K) 10 MEQ (1080 MG) SR tablet TAKE (1) TABLET BY MOUTH TWICE DAILY 180 tablet 1  . risedronate (ACTONEL) 35 MG tablet Take 1 tablet (35 mg total) by mouth every 7 (seven) days. with water on empty stomach, nothing by mouth or lie down for next 30 minutes. 4 tablet 3  . silver sulfADIAZINE (SILVADENE) 1 % cream Apply 1 application topically daily. 50 g 0   No current facility-administered medications for this visit.   Review of Systems General:  no complaints Skin: no complaints Eyes: no complaints HEENT: no complaints Breasts: no complaints Pulmonary: no complaints Cardiac: no complaints Gastrointestinal: no  complaints Genitourinary/Sexual: no complaints Ob/Gyn: no complaints Musculoskeletal: no complaints Hematology: no complaints Neurologic/Psych: no complaints   Objective:  Physical Examination:  BP (!) 163/96   Pulse 93   Temp 97.8 F (36.6 C)   Resp 20   Wt 242 lb (109.8 kg)   SpO2 100%   BMI 45.73 kg/m     ECOG Performance Status: 1 - Symptomatic but completely ambulatory  GENERAL: Patient is a well appearing female in no acute distress. Obese.  HEENT:  Sclera clear. Anicteric NODES:  Negative axillary, supraclavicular, inguinal lymph node survery LUNGS:  Clear to auscultation bilaterally.   HEART:  Regular rate and rhythm.  ABDOMEN:  Soft, nontender, nondistended.  No hernias, incisions well healed. No masses or ascites EXTREMITIES:  No peripheral edema. Atraumatic. No cyanosis SKIN:  Clear with no obvious rashes or skin changes.  NEURO:  Nonfocal. Well oriented.  Appropriate affect.  Pelvic: Exam chaperoned by CMA EGBUS: no lesions Vagina: vaginal cuff normal BME: no palpable masses Rectal: normal    Assessment:  Cassidy Parsons is a 75 y.o. female diagnosed with 1A mixed clear cell/endometrioid endometrial carcinoma (MSI; dMMR with loss of MLH1/PMS2 with methylated MLH1 alleles present) s/p TLH, RSO 08/11/19. SLN mapping unsuccessful and left adnexa could not be removed due to severe adhesions from diverticular disease.  No LVSI, or cervical/adnexal involvement and invades 23% of the myometrium. Pre-op CT scan negative for metastatic disease.   No evidence of disease on exam today.   Pulmonary nodules, 07/2019 on CT chest  Medical co-morbidities complicating care: HTN, morbid obesity (Body mass index is 45.73 kg/m.), prediabetes, possible h/o carotid stenosis.  Plan:   Problem List Items Addressed This Visit      Genitourinary   Primary clear cell adenocarcinoma of endometrium ([place]) - Primary     Bilateral pulmonary nodules, 2-3 mm nonspecific on CT scan.  Recommend repeat chest CT in 07/2020.   She will see Dr Baruch Gouty in 5/22 and will schedule RTC 3 months to see [place] in 5 months for continued surveillance.  Mellody Drown, MD

## 2020-03-23 NOTE — Telephone Encounter (Signed)
Called patient to make her aware of CT scan scheduled on 7/11. Patient agreeable. Mailing AVS.

## 2020-03-28 DIAGNOSIS — K219 Gastro-esophageal reflux disease without esophagitis: Secondary | ICD-10-CM | POA: Diagnosis not present

## 2020-03-28 DIAGNOSIS — R69 Illness, unspecified: Secondary | ICD-10-CM | POA: Diagnosis not present

## 2020-03-28 DIAGNOSIS — Z7722 Contact with and (suspected) exposure to environmental tobacco smoke (acute) (chronic): Secondary | ICD-10-CM | POA: Diagnosis not present

## 2020-03-28 DIAGNOSIS — R32 Unspecified urinary incontinence: Secondary | ICD-10-CM | POA: Diagnosis not present

## 2020-03-28 DIAGNOSIS — J309 Allergic rhinitis, unspecified: Secondary | ICD-10-CM | POA: Diagnosis not present

## 2020-03-28 DIAGNOSIS — N3281 Overactive bladder: Secondary | ICD-10-CM | POA: Diagnosis not present

## 2020-03-28 DIAGNOSIS — Z7982 Long term (current) use of aspirin: Secondary | ICD-10-CM | POA: Diagnosis not present

## 2020-03-28 DIAGNOSIS — Z809 Family history of malignant neoplasm, unspecified: Secondary | ICD-10-CM | POA: Diagnosis not present

## 2020-03-28 DIAGNOSIS — J449 Chronic obstructive pulmonary disease, unspecified: Secondary | ICD-10-CM | POA: Diagnosis not present

## 2020-03-28 DIAGNOSIS — Z79899 Other long term (current) drug therapy: Secondary | ICD-10-CM | POA: Diagnosis not present

## 2020-05-05 ENCOUNTER — Ambulatory Visit
Admission: RE | Admit: 2020-05-05 | Discharge: 2020-05-05 | Disposition: A | Discharge status: Home / Self Care | Payer: Medicare HMO | Attending: Hospice and Palliative Medicine | Admitting: Hospice and Palliative Medicine

## 2020-05-05 ENCOUNTER — Ambulatory Visit (INDEPENDENT_AMBULATORY_CARE_PROVIDER_SITE_OTHER): Payer: Medicare HMO | Admitting: Hospice and Palliative Medicine

## 2020-05-05 ENCOUNTER — Ambulatory Visit
Admission: RE | Admit: 2020-05-05 | Discharge: 2020-05-05 | Disposition: A | Discharge status: Home / Self Care | Payer: Medicare HMO | Source: Ambulatory Visit | Attending: Hospice and Palliative Medicine | Admitting: Hospice and Palliative Medicine

## 2020-05-05 ENCOUNTER — Other Ambulatory Visit: Payer: Self-pay

## 2020-05-05 ENCOUNTER — Encounter: Payer: Self-pay | Admitting: Hospice and Palliative Medicine

## 2020-05-05 VITALS — BP 150/82 | HR 70 | Temp 97.9°F | Resp 16 | Ht 60.0 in | Wt 241.2 lb

## 2020-05-05 DIAGNOSIS — Z1231 Encounter for screening mammogram for malignant neoplasm of breast: Secondary | ICD-10-CM | POA: Diagnosis not present

## 2020-05-05 DIAGNOSIS — M5442 Lumbago with sciatica, left side: Secondary | ICD-10-CM

## 2020-05-05 DIAGNOSIS — M545 Low back pain, unspecified: Secondary | ICD-10-CM | POA: Diagnosis not present

## 2020-05-05 DIAGNOSIS — M5441 Lumbago with sciatica, right side: Secondary | ICD-10-CM | POA: Diagnosis not present

## 2020-05-05 DIAGNOSIS — R030 Elevated blood-pressure reading, without diagnosis of hypertension: Secondary | ICD-10-CM | POA: Diagnosis not present

## 2020-05-05 NOTE — Progress Notes (Signed)
[facility] [place], [place] [address]  Internal MEDICINE  Office Visit Note  Patient Name: Cassidy Parsons  [id number]  [id number]  Date of Service: 05/11/2020  Chief Complaint  Patient presents with  . Follow-up  . Asthma  . Hypertension  . Cataract       . Diabetes  . Gastroesophageal Reflux  . Seizures    HPI Patient is here for routine follow-up About 6-7 weeks ago woke up with low back pain that was radiating to her buttocks and will occasionally radiate down her legs Tried taking Meloxicam as well as acetaminophen which did not relieve her pain--has been taking ibuprofen daily which has relieved her pain  She did not have to undergo chemotherapy for uterine carcinoma--completed radiation therapy and now being followed for surveillance  BP elevated today--reports she has never had an issue with her BP Denies chest pain, headaches or visual disturbances--feels this is related to low back pain   Current Medication: Outpatient Encounter Medications as of 05/05/2020  Medication Sig  . aspirin EC 81 MG tablet Take 81 mg by mouth daily.  Marland Kitchen azelastine (ASTELIN) 0.1 % nasal spray PLACE 1 SPRAY INTO EACH NOSTRIL DAILY ASDIRECTED.  . Calcium Carb-Cholecalciferol 600-200 MG-UNIT TABS Take 1 tablet by mouth daily.  . cetirizine (ZYRTEC) 10 MG tablet Take 10 mg by mouth daily.  Marland Kitchen docusate sodium (COLACE) 100 MG capsule Take 200 mg by mouth at bedtime. (Patient not taking: Reported on 05/11/2020)  . esomeprazole (NEXIUM) 40 MG capsule Take 40 mg by mouth daily at 12 noon.  Marland Kitchen ketoconazole (NIZORAL) 2 % shampoo Apply 1 application topically 2 (two) times a week.  . montelukast (SINGULAIR) 10 MG tablet Take 1 tablet (10 mg total) by mouth at bedtime.  . Multiple Vitamins-Minerals (MULTIVITAMIN ADULT PO) Take 1 tablet by mouth daily.  Marland Kitchen oxybutynin (DITROPAN) 5 MG tablet TAKE (1) TABLET BY MOUTH EVERY DAY  . potassium citrate (UROCIT-K) 10 MEQ (1080 MG) SR  tablet TAKE (1) TABLET BY MOUTH TWICE DAILY  . [DISCONTINUED] fluocinonide (LIDEX) 0.05 % external solution APPLY 1 APPLICATION TOPICALLY TWICE DAILY AS NEEDED FOR INFECTION  . [DISCONTINUED] fluticasone (FLONASE) 50 MCG/ACT nasal spray Place 1 spray into both nostrils at bedtime. (Patient not taking: Reported on 05/11/2020)  . [DISCONTINUED] Fluticasone-Salmeterol (ADVAIR) 250-50 MCG/DOSE AEPB Inhale 1 puff into the lungs 2 (two) times daily. (Patient not taking: Reported on 05/11/2020)  . [DISCONTINUED] meloxicam (MOBIC) 7.5 MG tablet Take 1 tablet (7.5 mg total) by mouth daily as needed for pain.  . [DISCONTINUED] risedronate (ACTONEL) 35 MG tablet Take 1 tablet (35 mg total) by mouth every 7 (seven) days. with water on empty stomach, nothing by mouth or lie down for next 30 minutes.  . [DISCONTINUED] silver sulfADIAZINE (SILVADENE) 1 % cream Apply 1 application topically daily.   No facility-administered encounter medications on file as of 05/05/2020.    Surgical History: Past Surgical History:  Procedure Laterality Date  . BRAIN SURGERY    . BREAST SURGERY     biopsy right breast  . CRANIOTOMY N/A 02/10/2015   Procedure: Removal of cranial plate with scalp wound revision;  Surgeon: Earnie Larsson, MD;  Location: [facility];  Service: Neurosurgery;  Laterality: N/A;  . DIAGNOSTIC LAPAROSCOPY     panniculectomy  . PARTIAL HYSTERECTOMY    . TONSILLECTOMY      Medical History: Past Medical History:  Diagnosis Date  . Asthma   . Brain tumor (benign) ([place])  right side  . Diabetes mellitus without complication (HCC)    borderline  . Early cataracts, bilateral   . Environmental and seasonal allergies   . GERD (gastroesophageal reflux disease)   . Hypertension    PMH:  . On home oxygen therapy    pt is no longer  using oxygen at night  . Seizures ([place])    haven't had a seizure since brain surgery in 2001  . Uterine cancer ([place])     Family History: Family History  Problem Relation  Age of Onset  . Cancer Mother   . Heart failure Father   . Stroke Sister   . Diabetes Other     Social History   Socioeconomic History  . Marital status: Widowed    Spouse name: Not on file  . Number of children: Not on file  . Years of education: Not on file  . Highest education level: Not on file  Occupational History  . Not on file  Tobacco Use  . Smoking status: Former Smoker    Types: Cigarettes  . Smokeless tobacco: Never Used  . Tobacco comment: quit smoking cigarettes 34 years ago  Vaping Use  . Vaping Use: Never used  Substance and Sexual Activity  . Alcohol use: No  . Drug use: No  . Sexual activity: Not on file  Other Topics Concern  . Not on file  Social History Narrative  . Not on file   Social Determinants of Health   Financial Resource Strain: Not on file  Food Insecurity: Not on file  Transportation Needs: Not on file  Physical Activity: Not on file  Stress: Not on file  Social Connections: Not on file  Intimate Partner Violence: Not on file      Review of Systems  Constitutional: Negative for chills, diaphoresis and fatigue.  HENT: Negative for ear pain, postnasal drip and sinus pressure.   Eyes: Negative for photophobia, discharge, redness, itching and visual disturbance.  Respiratory: Negative for cough, shortness of breath and wheezing.   Cardiovascular: Negative for chest pain, palpitations and leg swelling.  Gastrointestinal: Negative for abdominal pain, constipation, diarrhea, nausea and vomiting.  Genitourinary: Negative for dysuria and flank pain.  Musculoskeletal: Positive for back pain. Negative for arthralgias, gait problem and neck pain.  Skin: Negative for color change.  Allergic/Immunologic: Negative for environmental allergies and food allergies.  Neurological: Negative for dizziness and headaches.  Hematological: Does not bruise/bleed easily.  Psychiatric/Behavioral: Negative for agitation, behavioral problems (depression)  and hallucinations.    Vital Signs: BP (!) 150/82   Pulse 70   Temp 97.9 F (36.6 C)   Resp 16   Ht 5' (1.524 m)   Wt 241 lb 3.2 oz (109.4 kg)   SpO2 97%   BMI 47.11 kg/m    Physical Exam Vitals reviewed.  Constitutional:      Appearance: Normal appearance. She is normal weight.  Cardiovascular:     Rate and Rhythm: Normal rate and regular rhythm.     Pulses: Normal pulses.     Heart sounds: Normal heart sounds.  Pulmonary:     Effort: Pulmonary effort is normal.     Breath sounds: Normal breath sounds.  Musculoskeletal:     Cervical back: Normal range of motion.     Comments: Obvious discomfort with positional changes in low back Using cane for ambulation assistance  Skin:    General: Skin is warm.  Neurological:     General: No focal deficit present.  Mental Status: She is alert and oriented to person, place, and time. Mental status is at baseline.  Psychiatric:        Mood and Affect: Mood normal.        Behavior: Behavior normal.        Thought Content: Thought content normal.        Judgment: Judgment normal.   Assessment/Plan: 1. Acute midline low back pain with bilateral sciatica Review imaging and adjust plan of care as indicated May continue with ibuprofen as needed, advised to use acetaminophen more frequently than ibuprofen - DG Lumbar Spine Complete; Future  2. Encounter for screening mammogram for malignant neoplasm of breast - MM 3D SCREEN BREAST BILATERAL; Future  3. Elevated blood-pressure reading without diagnosis of hypertension BP elevated today also on recheck, decline initiating therapy today Encouraged to monitor BP at home and contact office if remains elevated  General Counseling: tonnette zwiebel understanding of the findings of todays visit and agrees with plan of treatment. I have discussed any further diagnostic evaluation that may be needed or ordered today. We also reviewed her medications today. she has been encouraged to call  the office with any questions or concerns that should arise related to todays visit.    Orders Placed This Encounter  Procedures  . DG Lumbar Spine Complete  . MM 3D SCREEN BREAST BILATERAL    Time spent: 30 Minutes Time spent includes review of chart, medications, test results and follow-up plan with the patient.  This patient was seen by Theodoro Grist AGNP-C in Collaboration with Dr Lavera Guise as a part of collaborative care agreement     Tanna Furry. Dailee Manalang AGNP-C Internal medicine

## 2020-05-06 ENCOUNTER — Other Ambulatory Visit: Payer: Self-pay | Admitting: Hospice and Palliative Medicine

## 2020-05-09 ENCOUNTER — Telehealth: Payer: Self-pay

## 2020-05-09 ENCOUNTER — Other Ambulatory Visit: Payer: Self-pay

## 2020-05-09 DIAGNOSIS — M5442 Lumbago with sciatica, left side: Secondary | ICD-10-CM

## 2020-05-09 MED ORDER — MELOXICAM 15 MG PO TABS: 15.0000 mg | ORAL_TABLET | Freq: Every day | ORAL | 1 refills | Status: DC

## 2020-05-09 MED ORDER — CYCLOBENZAPRINE HCL 5 MG PO TABS: 5.0000 mg | ORAL_TABLET | Freq: Three times a day (TID) | ORAL | 1 refills | Status: DC | PRN

## 2020-05-09 NOTE — Progress Notes (Signed)
Please call and let her know that I have reviewed her lumbar xray. No evidence of acute abnormality, chronic degenerative changes. Ask how her pain is? May benefit from physical therapy.

## 2020-05-09 NOTE — Telephone Encounter (Signed)
PA approved for CYCLOBENZAPRINE 5 mg on 05/09/20 and valid from 01/09/20 to 08/07/20 sent new prescription to pharmacy with PA approval

## 2020-05-11 ENCOUNTER — Encounter: Payer: Self-pay | Admitting: Hospice and Palliative Medicine

## 2020-05-11 ENCOUNTER — Ambulatory Visit
Admission: RE | Admit: 2020-05-11 | Discharge: 2020-05-11 | Disposition: A | Discharge status: Home / Self Care | Payer: Medicare HMO | Source: Ambulatory Visit | Attending: Radiation Oncology | Admitting: Radiation Oncology

## 2020-05-11 ENCOUNTER — Encounter: Payer: Self-pay | Admitting: Radiation Oncology

## 2020-05-11 ENCOUNTER — Other Ambulatory Visit: Payer: Self-pay

## 2020-05-11 VITALS — BP 150/87 | HR 74 | Temp 97.4°F | Resp 16 | Wt 242.0 lb

## 2020-05-11 DIAGNOSIS — C541 Malignant neoplasm of endometrium: Secondary | ICD-10-CM | POA: Diagnosis not present

## 2020-05-11 DIAGNOSIS — Z923 Personal history of irradiation: Secondary | ICD-10-CM | POA: Diagnosis not present

## 2020-05-11 DIAGNOSIS — Z90722 Acquired absence of ovaries, bilateral: Secondary | ICD-10-CM | POA: Insufficient documentation

## 2020-05-11 DIAGNOSIS — Z08 Encounter for follow-up examination after completed treatment for malignant neoplasm: Secondary | ICD-10-CM | POA: Diagnosis not present

## 2020-05-11 DIAGNOSIS — R918 Other nonspecific abnormal finding of lung field: Secondary | ICD-10-CM | POA: Insufficient documentation

## 2020-05-11 DIAGNOSIS — Z90710 Acquired absence of both cervix and uterus: Secondary | ICD-10-CM | POA: Diagnosis not present

## 2020-05-11 NOTE — Progress Notes (Signed)
Radiation Oncology Follow up Note  Name: Cassidy Parsons   Date:   05/11/2020 MRN:  [id number] DOB: Nov 20, 1945    This 75 y.o. female presents to the clinic today for 68-month follow-up status post whole pelvic radiation therapy for stage Ia clear cell carcinoma status post TAH/BSO.  REFERRING PROVIDER: Lavera Guise, MD  HPI: Patient is a 75 year old female now out 6 months having completed whole pelvic radiation therapy for stage Ia clear cell carcinoma..  She is seen today and is doing well she specifically [place] diarrhea or any increased lower urinary tract symptoms and even her fatigue has improved.  She has been having some lower back pain recently had a lumbar MRI showing no acute fracture or subluxation of the lumbar spine she does have multilevel degenerative changes.  CT scan of the chest has been ordered for July she has a small 5 mm nodule which we are watching.  She was examined recently by Dr. Fransisca Connors showing no evidence of disease.  COMPLICATIONS OF TREATMENT: none  FOLLOW UP COMPLIANCE: keeps appointments   PHYSICAL EXAM:  BP (!) 150/87   Pulse 74   Temp (!) 97.4 F (36.3 C)   Resp 16   Wt 242 lb (109.8 kg)   SpO2 96%   BMI 47.26 kg/m  Well-developed well-nourished patient in NAD. HEENT reveals PERLA, EOMI, discs not visualized.  Oral cavity is clear. No oral mucosal lesions are identified. Neck is clear without evidence of cervical or supraclavicular adenopathy. Lungs are clear to A&P. Cardiac examination is essentially unremarkable with regular rate and rhythm without murmur rub or thrill. Abdomen is benign with no organomegaly or masses noted. Motor sensory and DTR levels are equal and symmetric in the upper and lower extremities. Cranial nerves II through XII are grossly intact. Proprioception is intact. No peripheral adenopathy or edema is identified. No motor or sensory levels are noted. Crude visual fields are within normal range.  RADIOLOGY RESULTS: MRI of lumbar  spine reviewed compatible with above-stated findings  PLAN: Present time she is doing well with very low side effect profile she is recovered from some of the side effect she had right after radiation was completed.  She had a normal exam having no significant side effects.  She continues close follow-up care with Dr. Fransisca Connors seeing him again in November.  I have asked to see her back in 6 months for follow-up.  Patient is to call with any concerns.  I would like to take this opportunity to thank you for allowing me to participate in the care of your patient.Noreene Filbert, MD

## 2020-05-12 ENCOUNTER — Other Ambulatory Visit: Payer: Self-pay | Admitting: Hospice and Palliative Medicine

## 2020-05-12 ENCOUNTER — Telehealth: Payer: Self-pay

## 2020-05-12 DIAGNOSIS — S39012A Strain of muscle, fascia and tendon of lower back, initial encounter: Secondary | ICD-10-CM | POA: Diagnosis not present

## 2020-05-12 DIAGNOSIS — R5383 Other fatigue: Secondary | ICD-10-CM

## 2020-05-12 DIAGNOSIS — Z1231 Encounter for screening mammogram for malignant neoplasm of breast: Secondary | ICD-10-CM | POA: Diagnosis not present

## 2020-05-12 NOTE — Telephone Encounter (Signed)
lmom to call us back

## 2020-05-16 NOTE — Telephone Encounter (Signed)
Sent pt for A1c

## 2020-05-19 ENCOUNTER — Telehealth: Payer: Self-pay | Admitting: Internal Medicine

## 2020-05-19 DIAGNOSIS — M5416 Radiculopathy, lumbar region: Secondary | ICD-10-CM | POA: Diagnosis not present

## 2020-05-19 NOTE — Progress Notes (Signed)
  Chronic Care Management   Outreach Note  05/19/2020 Name: Cassidy Parsons MRN: [id number] DOB: [birth year]  Referred by: Lavera Guise, MD Reason for referral : No chief complaint on file.   An unsuccessful telephone outreach was attempted today. The patient was referred to the pharmacist for assistance with care management and care coordination.   Follow Up Plan:   Carley Perdue [employer] Scheduler

## 2020-05-20 ENCOUNTER — Telehealth: Payer: Self-pay | Admitting: Internal Medicine

## 2020-05-20 NOTE — Progress Notes (Signed)
  Chronic Care Management   Outreach Note  05/20/2020 Name: Cassidy Parsons MRN: [id number] DOB: 1945-05-11  Referred by: Lavera Guise, MD Reason for referral : No chief complaint on file.   A second unsuccessful telephone outreach was attempted today. The patient was referred to pharmacist for assistance with care management and care coordination.  Follow Up Plan:   Carley Perdue [employer] Scheduler

## 2020-05-25 ENCOUNTER — Other Ambulatory Visit: Payer: Self-pay | Admitting: Physician Assistant

## 2020-05-25 DIAGNOSIS — M5416 Radiculopathy, lumbar region: Secondary | ICD-10-CM

## 2020-05-31 DIAGNOSIS — M5116 Intervertebral disc disorders with radiculopathy, lumbar region: Secondary | ICD-10-CM | POA: Diagnosis not present

## 2020-05-31 DIAGNOSIS — R600 Localized edema: Secondary | ICD-10-CM | POA: Diagnosis not present

## 2020-05-31 DIAGNOSIS — M5117 Intervertebral disc disorders with radiculopathy, lumbosacral region: Secondary | ICD-10-CM | POA: Diagnosis not present

## 2020-05-31 DIAGNOSIS — M4848XA Fatigue fracture of vertebra, sacral and sacrococcygeal region, initial encounter for fracture: Secondary | ICD-10-CM | POA: Diagnosis not present

## 2020-05-31 LAB — HM MAMMOGRAPHY: HM Mammogram: NORMAL (ref 0–4)

## 2020-06-01 ENCOUNTER — Telehealth: Payer: Self-pay | Admitting: Internal Medicine

## 2020-06-01 NOTE — Chronic Care Management (AMB) (Signed)
  Chronic Care Management   Outreach Note  06/01/2020 Name: Cassidy Parsons MRN: [id number] DOB: Aug 15, 1945  Referred by: Lavera Guise, MD Reason for referral : No chief complaint on file.   Third unsuccessful telephone outreach was attempted today. The patient was referred to the pharmacist for assistance with care management and care coordination.   Follow Up Plan:   Tatjana Dellinger [employer] scheduler

## 2020-07-15 DIAGNOSIS — M5416 Radiculopathy, lumbar region: Secondary | ICD-10-CM | POA: Diagnosis not present

## 2020-07-18 ENCOUNTER — Other Ambulatory Visit: Payer: Self-pay

## 2020-07-18 ENCOUNTER — Ambulatory Visit
Admission: RE | Admit: 2020-07-18 | Discharge: 2020-07-18 | Disposition: A | Discharge status: Home / Self Care | Payer: Medicare HMO | Source: Ambulatory Visit | Attending: Nurse Practitioner | Admitting: Nurse Practitioner

## 2020-07-18 DIAGNOSIS — I700 Atherosclerosis of aorta: Secondary | ICD-10-CM | POA: Diagnosis not present

## 2020-07-18 DIAGNOSIS — R918 Other nonspecific abnormal finding of lung field: Secondary | ICD-10-CM | POA: Diagnosis not present

## 2020-07-18 DIAGNOSIS — C541 Malignant neoplasm of endometrium: Secondary | ICD-10-CM | POA: Insufficient documentation

## 2020-07-18 DIAGNOSIS — R911 Solitary pulmonary nodule: Secondary | ICD-10-CM | POA: Diagnosis not present

## 2020-07-18 LAB — POCT I-STAT CREATININE: Creatinine, Ser: 0.6 mg/dL (ref 0.44–1.00)

## 2020-07-18 MED ORDER — IOHEXOL 300 MG/ML  SOLN
75.0000 mL | Freq: Once | INTRAMUSCULAR | Status: AC | PRN
  Administered 2020-07-18: 75 mL via INTRAVENOUS

## 2020-07-25 ENCOUNTER — Telehealth: Payer: Self-pay

## 2020-07-25 NOTE — Telephone Encounter (Signed)
Call placed to [facility] radiology regarding read on 7/11 CT scan. They are awaiting previous imaging for comparison. Will follow up.

## 2020-07-27 DIAGNOSIS — E119 Type 2 diabetes mellitus without complications: Secondary | ICD-10-CM | POA: Diagnosis not present

## 2020-08-01 ENCOUNTER — Telehealth: Payer: Self-pay

## 2020-08-01 ENCOUNTER — Ambulatory Visit
Admission: RE | Admit: 2020-08-01 | Discharge: 2020-08-01 | Disposition: A | Discharge status: Home / Self Care | Payer: Self-pay | Source: Ambulatory Visit | Attending: Nurse Practitioner | Admitting: Nurse Practitioner

## 2020-08-01 DIAGNOSIS — R918 Other nonspecific abnormal finding of lung field: Secondary | ICD-10-CM

## 2020-08-01 DIAGNOSIS — C541 Malignant neoplasm of endometrium: Secondary | ICD-10-CM

## 2020-08-01 NOTE — Telephone Encounter (Signed)
Second call made to [facility] radiology regarding no read on 07/18/20 CT scan. States scan is suspended until comparison images are received. Spoke with their IT department and they report no images have been received and that Dr. Earma Reading is requesting imaging for comparison. I have sent a request to [facility] to powershare these images. I will notify [facility] once received.

## 2020-08-02 ENCOUNTER — Telehealth: Payer: Self-pay

## 2020-08-02 NOTE — Telephone Encounter (Signed)
Notified [facility] radiology that images have been received for comparison.

## 2020-08-05 ENCOUNTER — Other Ambulatory Visit: Payer: Self-pay

## 2020-08-08 ENCOUNTER — Other Ambulatory Visit: Payer: Self-pay

## 2020-08-08 MED ORDER — POTASSIUM CITRATE ER 10 MEQ (1080 MG) PO TBCR: EXTENDED_RELEASE_TABLET | ORAL | 1 refills | Status: DC

## 2020-08-11 ENCOUNTER — Telehealth: Payer: Self-pay

## 2020-08-11 ENCOUNTER — Other Ambulatory Visit: Payer: Self-pay

## 2020-08-12 ENCOUNTER — Other Ambulatory Visit: Payer: Self-pay

## 2020-08-12 ENCOUNTER — Telehealth: Payer: Self-pay

## 2020-08-12 MED ORDER — FLUOCINONIDE 0.05 % EX SOLN: 1.0000 "application " | Freq: Two times a day (BID) | CUTANEOUS | 1 refills | Status: DC

## 2020-08-12 NOTE — Telephone Encounter (Signed)
Called and notified with chest CT results from 07/18/20  IMPRESSION: 1. Pulmonary nodules are unchanged compared to the outside CT of 08/07/2019 and are considered benign. 2.  No acute process or evidence of metastatic disease in the chest.

## 2020-08-12 NOTE — Telephone Encounter (Signed)
Refill sent.

## 2020-08-15 ENCOUNTER — Other Ambulatory Visit: Payer: Self-pay | Admitting: Internal Medicine

## 2020-08-17 ENCOUNTER — Other Ambulatory Visit: Payer: Self-pay

## 2020-08-17 MED ORDER — OXYBUTYNIN CHLORIDE 5 MG PO TABS: ORAL_TABLET | ORAL | 0 refills | Status: DC

## 2020-08-24 ENCOUNTER — Inpatient Hospital Stay: Payer: Medicare HMO | Attending: Obstetrics and Gynecology | Admitting: Obstetrics and Gynecology

## 2020-08-24 VITALS — BP 142/81 | HR 87 | Temp 98.2°F | Resp 18 | Wt 242.0 lb

## 2020-08-24 DIAGNOSIS — Z90710 Acquired absence of both cervix and uterus: Secondary | ICD-10-CM | POA: Insufficient documentation

## 2020-08-24 DIAGNOSIS — Z79899 Other long term (current) drug therapy: Secondary | ICD-10-CM | POA: Insufficient documentation

## 2020-08-24 DIAGNOSIS — R918 Other nonspecific abnormal finding of lung field: Secondary | ICD-10-CM

## 2020-08-24 DIAGNOSIS — I10 Essential (primary) hypertension: Secondary | ICD-10-CM | POA: Diagnosis not present

## 2020-08-24 DIAGNOSIS — Z6841 Body Mass Index (BMI) 40.0 and over, adult: Secondary | ICD-10-CM | POA: Insufficient documentation

## 2020-08-24 DIAGNOSIS — Z90722 Acquired absence of ovaries, bilateral: Secondary | ICD-10-CM | POA: Insufficient documentation

## 2020-08-24 DIAGNOSIS — E1136 Type 2 diabetes mellitus with diabetic cataract: Secondary | ICD-10-CM | POA: Diagnosis not present

## 2020-08-24 DIAGNOSIS — C541 Malignant neoplasm of endometrium: Secondary | ICD-10-CM

## 2020-08-24 NOTE — Progress Notes (Signed)
Gynecologic Oncology Consult Visit   Referring Provider: Dr. Kenton Kingfisher  Chief Complaint: 1A mixed clear cell/endometrioid endometrial carcinoma the endometrium status post TLH/BSO, radiation  Subjective:  Cassidy Parsons is a 75 y.o. 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 Initial pathology from endometrial biopsy was reviewed at [facility] and confirmed clear cell carcinoma.   [facility] radiology regarding read on 07/18/20 CT scan. They are awaiting previous imaging for comparison. Per patient she received a call stating findings were not concerning. She presents for surveillance. She has back pain and recently had a MRI showing DJD. No concerning symptoms for recurrence.   Gyn Oncology history Patient has ~ 5 year history of PMB, worse since May 2021 after she fell on her stomach. Endorses abdominal pain radiates from left side around front to right pelvis and back. On 7/26 underwent endometrial biopsy.   Pap 08/03/2019- NILM  Pathology:  A. Endometrium, Biopsy:  - Clear cell carcinoma, FIGO grade 3   Husband died in 04-11-2022 and she delayed self care because she was taking care of him.   CT scan C/A/P 08/07/19 did not show metastatic disease. Impression:  1. Heterogeneous and thickened appearance of the endometrium in keeping with patients known primary malignancy.  2. No definite evidence of metastatic disease in the chest, abdomen, or pelvis.  3. Bilateral pulmonary nodules, nonspecific. Recommend continued attention on follow-up.  4. Mild thickening of the sigmoid colon and tethering of the superior bladder wall, favored sequela of prior diverticulitis.   Underwent TLH, BSx/RO SLN mapping at [facility] 08/11/19.  Grade 2 endometrioid 4.2 cm cancer with 26% invasion.  Negative right adnexa and cervix and no LVSI.  Left adnexa could not be  removed due to dense adhesions of sigmoid colon secondary to severe diverticular disease.   Comment: The tumor demonstrates foci of well-formed endometrioid glands with defined nuclear polarity, but a considerable portion of the tumor appears solid, with large areas of squamous differentiation.  Morphologic features of clear cell carcinoma, such as marked nuclear atypia, hobnail architecture, hyalinized stroma, and intracytoplasmic eosinophilic globules, are not identified.  Secretory change is present in some cells, which stain positive for the markers Napsin A and AMACR.  The reported history of a prior biopsy with clear cell carcinoma is noted.  Review of this biopsy may be beneficial to exclude the possibility of a mixed clear cell/endometrioid tumor, if this diagnosis would influence patient management.  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   Slides were reviewed at [facility] Dr. Fransisca Connors recommended adjuvant WPRT.   10/20/19-11/23/19 completed WPRT . Suffered diarrhea and fatigue during treatment and she has a h/o urinary frequency unrelieved by oxybutynin.    .    Problem List: Patient Active Problem List   Diagnosis Date Noted   Primary clear cell adenocarcinoma of endometrium ([place]) 12/23/2019   Morbid obesity with BMI of 45.0-49.9, adult ([place]) 08/12/2019   Urinary tract infection without hematuria 08/07/2018   Dysuria 08/07/2018   Osteoarthritis of knee 04/11/2017   Low back pain 02/05/2017   Mild persistent asthma, uncomplicated 61/95/0932   Rhinitis, allergic 02/05/2017   GAD (generalized anxiety disorder) 02/05/2017   Unspecified complication of internal orthopedic prosthetic device, implant and graft, initial encounter ([place]) 02/05/2017   Diabetes mellitus without complication ([place]) 67/12/4578   Gout, unspecified 02/05/2017   Occlusion and stenosis  of bilateral carotid arteries 02/05/2017   Insomnia, unspecified 02/05/2017   Sleep apnea  02/05/2017   Elevated blood-pressure reading without diagnosis of hypertension 02/05/2017   Dupuytren's disease of palm 10/05/2016   Exposed orthopaedic hardware ([place]) 02/10/2015    Past Medical History: Past Medical History:  Diagnosis Date   Asthma    Brain tumor (benign) ([place])    right side   Diabetes mellitus without complication ([place])    borderline   Early cataracts, bilateral    Environmental and seasonal allergies    GERD (gastroesophageal reflux disease)    Hypertension    PMH:   On home oxygen therapy    pt is no longer  using oxygen at night   Seizures ([place])    haven't had a seizure since brain surgery in 2001   Uterine cancer [facility])     Past Surgical History: Past Surgical History:  Procedure Laterality Date   BRAIN SURGERY     BREAST SURGERY     biopsy right breast   CRANIOTOMY N/A 02/10/2015   Procedure: Removal of cranial plate with scalp wound revision;  Surgeon: Earnie Larsson, MD;  Location: [facility]UR[facility] ORS;  Service: Neurosurgery;  Laterality: N/A;   DIAGNOSTIC LAPAROSCOPY     panniculectomy   PARTIAL HYSTERECTOMY     TONSILLECTOMY      Past Gynecologic History:  Menarche: age 79-11 Post menopausal Post menopausal bleeding    OB History:  OB History  Gravida Para Term Preterm AB Living  _0 SAB IAB Ectopic Multiple Live Births               # Outcome Date GA Lbr Len/2nd Weight Sex Delivery Anes PTL Lv  2 AB           1 Term             Family History: Family History  Problem Relation Age of Onset   Cancer Mother    Heart failure Father    Stroke Sister    Diabetes Other     Social History: Social History   Socioeconomic History   Marital status: Widowed    Spouse name: Not on file   Number of children: Not on file   Years of education: Not on file   Highest education level: Not on file  Occupational History   Not on file  Tobacco Use   Smoking status: Former    Types: Cigarettes   Smokeless tobacco: Never   Tobacco  comments:    quit smoking cigarettes 34 years ago  Vaping Use   Vaping Use: Never used  Substance and Sexual Activity   Alcohol use: No   Drug use: No   Sexual activity: Not on file  Other Topics Concern   Not on file  Social History Narrative   Not on file   Social Determinants of Health   Financial Resource Strain: Not on file  Food Insecurity: Not on file  Transportation Needs: Not on file  Physical Activity: Not on file  Stress: Not on file  Social Connections: Not on file  Intimate Partner Violence: Not on file   Immunization History  Administered Date(s) Administered   Influenza-Unspecified 10/22/2016, 09/21/2019   Moderna Sars-Covid-2 Vaccination 03/02/2019, 03/31/2019   Pneumococcal Conjugate-13 07/23/2015   Zoster, Live 10/16/2012   Allergies: Allergies  Allergen Reactions   Dust Mite Extract    Mold Extract  [Trichophyton]    Molds & Smuts Other (See  Comments)   Pollen Extract    Roxanol [Morphine] Other (See Comments)    Extreme pain   Meperidine Rash    Itching   Meperidine And Related Itching and Rash   Other Itching, Rash and Other (See Comments)    All pain relievers except hydocodone Trees, mold, dust, and perfume allergies (sneezing, eyes swell up)   Propoxyphene Itching and Rash   Tape Itching and Rash    With paper tape.  Please use adhesive tape Rash   Tramadol Itching and Rash    Current Medications: Current Outpatient Medications  Medication Sig Dispense Refill   aspirin EC 81 MG tablet Take 81 mg by mouth daily.     azelastine (ASTELIN) 0.1 % nasal spray PLACE 1 SPRAY INTO EACH NOSTRIL DAILY ASDIRECTED. 30 mL 3   Calcium Carb-Cholecalciferol 600-200 MG-UNIT TABS Take 1 tablet by mouth daily.     cetirizine (ZYRTEC) 10 MG tablet Take 10 mg by mouth daily.     cyclobenzaprine (FLEXERIL) 5 MG tablet Take 1 tablet (5 mg total) by mouth 3 (three) times daily as needed for muscle spasms. 30 tablet 1   esomeprazole (NEXIUM) 40 MG capsule  Take 40 mg by mouth daily at 12 noon.     fluticasone (FLONASE) 50 MCG/ACT nasal spray Place 1 spray into both nostrils daily.     ketoconazole (NIZORAL) 2 % shampoo Apply 1 application topically 2 (two) times a week. 120 mL 1   montelukast (SINGULAIR) 10 MG tablet TAKE (1) TABLET BY MOUTH DAILY AT BEDTIME 90 tablet 1   Multiple Vitamins-Minerals (MULTIVITAMIN ADULT PO) Take 1 tablet by mouth daily.     oxybutynin (DITROPAN) 5 MG tablet TAKE (1) TABLET BY MOUTH EVERY DAY 90 tablet 0   potassium citrate (UROCIT-K) 10 MEQ (1080 MG) SR tablet Take 1 tablet by mouth twice daily 180 tablet 1   docusate sodium (COLACE) 100 MG capsule Take 200 mg by mouth at bedtime. (Patient not taking: No sig reported)     fluocinonide (LIDEX) 0.05 % external solution Apply 1 application topically 2 (two) times daily. (Patient not taking: Reported on 08/24/2020) 60 mL 1   No current facility-administered medications for this visit.   Review of Systems General:  no complaints Skin: no complaints Eyes: no complaints HEENT: no complaints Breasts: no complaints Pulmonary: no complaints Cardiac: no complaints Gastrointestinal: no complaints Genitourinary/Sexual: no complaints Ob/Gyn: no complaints Musculoskeletal:  back pain o/w no complaints Hematology: no complaints Neurologic/Psych: no complaints   Objective:  Physical Examination:  BP (!) 142/81   Pulse 87   Temp 98.2 F (36.8 C) (Tympanic)   Resp 18   Wt 242 lb (109.8 kg)   SpO2 96%   BMI 47.26 kg/m     ECOG Performance Status: 1 - Symptomatic but completely ambulatory  GENERAL: Patient is a well appearing female in no acute distress HEENT:  PERRL, neck supple with midline trachea. Thyroid without masses.  NODES:  No cervical, supraclavicular, axillary, or inguinal lymphadenopathy palpated.  LUNGS:  Normal respiratory pattern   HEART:  Regular rate and rhythm.  ABDOMEN:  Soft, nontender, nondistended. No masses, ascites, hernias.   SKIN:   abdominal incisions wells healed.  NEURO:  Nonfocal. Well oriented.  Appropriate affect.  Pelvic: chaparoned by CMA EGBUS: no lesions Cervix: surgically absent  Vagina: no lesions, no discharge or bleeding. Mild agglutination.  Uterus: surgically absent  Adnexa: possible palpable masses the the vaginal mass - or this could represent the left adnexa  that was left insitu. Last exam this was not palpated.  Rectovaginal: confirmatory     Assessment:  Cassidy Parsons is a 75 y.o. female diagnosed with 1A mixed clear cell/endometrioid endometrial carcinoma (MSI; dMMR with loss of MLH1/PMS2 with methylated MLH1 alleles present) s/p TLH, RSO 08/11/19. SLN mapping unsuccessful and left adnexa could not be removed due to severe adhesions from diverticular disease.  No LVSI, or cervical/adnexal involvement and invades 23% of the myometrium. Pre-op CT scan negative for metastatic disease.   On exam today concern for possible vaginal cuff mass ore this could represent left adnexa.   Pulmonary nodules, 07/2019 on CT chest, on follow up CT at [facility] 2022 report reassuring   Medical co-morbidities complicating care: HTN, morbid obesity (Body mass index is 47.26 kg/m.), prediabetes, possible h/o carotid stenosis.  Plan:   Problem List Items Addressed This Visit       Genitourinary   Primary clear cell adenocarcinoma of endometrium [facility]) - Primary   Relevant Orders   CT Abdomen Pelvis W Contrast   Other Visit Diagnoses     Endometrial cancer ([place])       Relevant Orders   CT Abdomen Pelvis W Contrast   Pulmonary nodules          Obtain CT A/P to assess vaginal cuff findings and possible mass. She agrees with this plan.   Bilateral pulmonary nodules, 2-3 mm nonspecific on CT scan. Repeat chest CT in 07/2020 per patient reassuring. I do not see a report in [place]. Recommend repeat in 6-12 months.    Reschedule RTC 3 months for continued surveillance. Recommend close surveillance for  at least 5 years.   Dotti Busey Gaetana Michaelis, MD

## 2020-08-29 ENCOUNTER — Encounter: Payer: Self-pay | Admitting: Internal Medicine

## 2020-08-29 ENCOUNTER — Other Ambulatory Visit: Payer: Self-pay

## 2020-08-29 ENCOUNTER — Ambulatory Visit (INDEPENDENT_AMBULATORY_CARE_PROVIDER_SITE_OTHER): Payer: Medicare HMO | Admitting: Internal Medicine

## 2020-08-29 VITALS — BP 142/80 | HR 71 | Temp 97.8°F | Resp 16 | Ht 60.0 in | Wt 241.4 lb

## 2020-08-29 DIAGNOSIS — R4589 Other symptoms and signs involving emotional state: Secondary | ICD-10-CM

## 2020-08-29 DIAGNOSIS — Z6841 Body Mass Index (BMI) 40.0 and over, adult: Secondary | ICD-10-CM | POA: Diagnosis not present

## 2020-08-29 DIAGNOSIS — Z1231 Encounter for screening mammogram for malignant neoplasm of breast: Secondary | ICD-10-CM

## 2020-08-29 DIAGNOSIS — R69 Illness, unspecified: Secondary | ICD-10-CM | POA: Diagnosis not present

## 2020-08-29 DIAGNOSIS — E1165 Type 2 diabetes mellitus with hyperglycemia: Secondary | ICD-10-CM | POA: Diagnosis not present

## 2020-08-29 DIAGNOSIS — C541 Malignant neoplasm of endometrium: Secondary | ICD-10-CM

## 2020-08-29 LAB — POCT UA - MICROALBUMIN
Albumin/Creatinine Ratio, Urine, POC: <30
Creatinine, POC: 100 mg/dL
Microalbumin Ur, POC: 10 mg/L

## 2020-08-29 LAB — POCT GLYCOSYLATED HEMOGLOBIN (HGB A1C): Hemoglobin A1C: 6.9 % — AB (ref 4.0–5.6)

## 2020-08-29 NOTE — Progress Notes (Signed)
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 This was a incomplete stage due to patient's body habitus and questionable adhesive disease patient also received radiation therapy. When she has been feeling depressed due to loss of her husband and dealing with cancer. She lives by herself does not have an active source of income and it is hard to live on [place].  Last month she did not have money to buy her medications so skipped some of the medications. Patient is also diabetic according to her hemoglobin A1c but was unable to tolerate metformin and the other medications are expensive patient denies any suicidal ideation. Her daughter lives in [place] and has not visited her in a long time  Current Medication: Outpatient Encounter Medications as of 08/29/2020  Medication Sig   aspirin EC 81 MG tablet Take 81 mg by mouth daily.   azelastine (ASTELIN) 0.1 % nasal spray PLACE 1 SPRAY INTO EACH NOSTRIL DAILY ASDIRECTED.   Calcium Carb-Cholecalciferol 600-200 MG-UNIT TABS Take 1 tablet by mouth daily.   cetirizine (ZYRTEC) 10 MG tablet Take 10 mg by mouth daily.   esomeprazole (NEXIUM) 40 MG capsule Take 40 mg by mouth daily at 12 noon.   fluocinonide (LIDEX) 0.05 % external solution Apply 1 application topically 2 (two) times daily.   fluticasone (FLONASE) 50 MCG/ACT nasal spray Place 1 spray into both nostrils daily.   ketoconazole (NIZORAL) 2 % shampoo Apply  1 application topically 2 (two) times a week.   montelukast (SINGULAIR) 10 MG tablet TAKE (1) TABLET BY MOUTH DAILY AT BEDTIME   Multiple Vitamins-Minerals (MULTIVITAMIN ADULT PO) Take 1 tablet by mouth daily.   oxybutynin (DITROPAN) 5 MG tablet TAKE (1) TABLET BY MOUTH EVERY DAY   potassium citrate (UROCIT-K) 10 MEQ (1080 MG) SR tablet Take 1 tablet by mouth twice daily   [DISCONTINUED] cyclobenzaprine (FLEXERIL) 5 MG tablet Take 1 tablet (5 mg total) by mouth 3 (three) times daily as needed for muscle spasms.   [DISCONTINUED] docusate sodium (COLACE) 100 MG capsule Take 200 mg by mouth at bedtime. (Patient not taking: No sig reported)   No facility-administered encounter medications on file as of 08/29/2020.    Surgical History: Past Surgical History:  Procedure Laterality Date   BRAIN SURGERY     BREAST SURGERY     biopsy right breast   CRANIOTOMY N/A 02/10/2015   Procedure: Removal of cranial plate with scalp wound revision;  Surgeon: Earnie Larsson, MD;  Location: [place]UR[facility] OR[facility];  Service: Neurosurgery;  Laterality: N/A;   DIAGNOSTIC LAPAROSCOPY     panniculectomy   PARTIAL HYSTERECTOMY     TONSILLECTOMY      Medical History: Past Medical History:  Diagnosis Date   Asthma    Brain tumor (benign) ([place])    right side   Diabetes mellitus without complication ([place])    borderline   Early cataracts, bilateral    Environmental and seasonal allergies  GERD (gastroesophageal reflux disease)    Hypertension    PMH:   On home oxygen therapy    pt is no longer  using oxygen at night   Seizures (HCC)    haven't had a seizure since brain surgery in 2001   Uterine cancer [facility])     Family History: Family History  Problem Relation Age of Onset   Cancer Mother    Heart failure Father    Stroke Sister    Diabetes Other     Social History   Socioeconomic History   Marital status: Widowed    Spouse name: Not on file   Number of children: Not on file   Years of education: Not on  file   Highest education level: Not on file  Occupational History   Not on file  Tobacco Use   Smoking status: Former    Types: Cigarettes   Smokeless tobacco: Never   Tobacco comments:    quit smoking cigarettes 34 years ago  Vaping Use   Vaping Use: Never used  Substance and Sexual Activity   Alcohol use: No   Drug use: No   Sexual activity: Not on file  Other Topics Concern   Not on file  Social History Narrative   Not on file   Social Determinants of Health   Financial Resource Strain: Not on file  Food Insecurity: Not on file  Transportation Needs: Not on file  Physical Activity: Not on file  Stress: Not on file  Social Connections: Not on file  Intimate Partner Violence: Not on file      Review of Systems  Constitutional:  Negative for chills, diaphoresis and fatigue.  HENT:  Negative for ear pain, postnasal drip and sinus pressure.   Eyes:  Negative for photophobia, discharge, redness, itching and visual disturbance.  Respiratory:  Negative for cough, shortness of breath and wheezing.   Cardiovascular:  Negative for chest pain, palpitations and leg swelling.  Gastrointestinal:  Negative for abdominal pain, constipation, diarrhea, nausea and vomiting.  Genitourinary:  Negative for dysuria and flank pain.  Musculoskeletal:  Negative for arthralgias, back pain, gait problem and neck pain.  Skin:  Negative for color change.  Allergic/Immunologic: Negative for environmental allergies and food allergies.  Neurological:  Negative for dizziness and headaches.  Hematological:  Does not bruise/bleed easily.  Psychiatric/Behavioral:  Positive for dysphoric mood. Negative for agitation, behavioral problems (depression) and hallucinations.    Vital Signs: BP (!) 142/80   Pulse 71   Temp 97.8 F (36.6 C)   Resp 16   Ht 5' (1.524 m)   Wt 241 lb 6.4 oz (109.5 kg)   SpO2 98%   BMI 47.15 kg/m    Physical Exam Constitutional:      General: She is not in acute  distress.    Appearance: She is well-developed. She is not diaphoretic.  HENT:     Head: Normocephalic and atraumatic.     Mouth/Throat:     Pharynx: No oropharyngeal exudate.  Eyes:     Pupils: Pupils are equal, round, and reactive to light.  Neck:     Thyroid: No thyromegaly.     Vascular: No JVD.     Trachea: No tracheal deviation.  Cardiovascular:     Rate and Rhythm: Normal rate and regular rhythm.     Heart sounds: Normal heart sounds. No murmur heard.   No friction rub. No gallop.  Pulmonary:     Effort: Pulmonary effort is normal. No  respiratory distress.     Breath sounds: No wheezing or rales.  Chest:     Chest wall: No tenderness.  Abdominal:     General: Bowel sounds are normal.     Palpations: Abdomen is soft.  Musculoskeletal:        General: Normal range of motion.     Cervical back: Normal range of motion and neck supple.  Lymphadenopathy:     Cervical: No cervical adenopathy.  Skin:    General: Skin is warm and dry.  Neurological:     Mental Status: She is alert and oriented to person, place, and time.     Cranial Nerves: No cranial nerve deficit.  Psychiatric:        Behavior: Behavior normal.        Thought Content: Thought content normal.        Judgment: Judgment normal.     Assessment/Plan: 1. Uncontrolled type 2 diabetes mellitus with hyperglycemia (HCC) Samples of Rybelsus given 3 mg po qd  - POCT HgB A1C - POCT UA - Microalbumin - Ambulatory referral to Ophthalmology  2. Visit for screening mammogram Mammogram ordered   3. Primary clear cell adenocarcinoma of endometrium ([place]) Followed by oncology   4. Sad mood Emotional support is provided, offered for financial arrangement for meds   5. Morbid obesity with BMI of 45.0-49.9, adult (HCC) Obesity Counseling: Risk Assessment: An assessment of behavioral risk factors was made today and includes lack of exercise sedentary lifestyle, lack of portion control and poor dietary  habits.  Risk Modification Advice: She was counseled on portion control guidelines. Restricting daily caloric intake to 1800. The detrimental long term effects of obesity on her health and ongoing poor compliance was also discussed with the patient.     General Counseling: laya bache understanding of the findings of todays visit and agrees with plan of treatment. I have discussed any further diagnostic evaluation that may be needed or ordered today. We also reviewed her medications today. she has been encouraged to call the office with any questions or concerns that should arise related to todays visit.    Orders Placed This Encounter  Procedures   HM MAMMOGRAPHY   MM DIGITAL SCREENING BILATERAL   Ambulatory referral to Ophthalmology   POCT HgB A1C   POCT UA - Microalbumin    No orders of the defined types were placed in this encounter.   Total time spent:35 Minutes Time spent includes review of chart, medications, test results, and follow up plan with the patient.   [place] Controlled Substance Database was reviewed by me.   Dr Lavera Guise Internal medicine

## 2020-09-08 DIAGNOSIS — H40003 Preglaucoma, unspecified, bilateral: Secondary | ICD-10-CM | POA: Diagnosis not present

## 2020-09-08 DIAGNOSIS — H2513 Age-related nuclear cataract, bilateral: Secondary | ICD-10-CM | POA: Diagnosis not present

## 2020-09-15 ENCOUNTER — Telehealth: Payer: Self-pay

## 2020-09-19 ENCOUNTER — Ambulatory Visit
Admission: RE | Admit: 2020-09-19 | Discharge: 2020-09-19 | Disposition: A | Discharge status: Home / Self Care | Payer: Medicare HMO | Source: Ambulatory Visit | Attending: Obstetrics and Gynecology | Admitting: Obstetrics and Gynecology

## 2020-09-19 ENCOUNTER — Telehealth: Payer: Self-pay

## 2020-09-19 ENCOUNTER — Other Ambulatory Visit: Payer: Self-pay

## 2020-09-19 DIAGNOSIS — Z90710 Acquired absence of both cervix and uterus: Secondary | ICD-10-CM | POA: Diagnosis not present

## 2020-09-19 DIAGNOSIS — C541 Malignant neoplasm of endometrium: Secondary | ICD-10-CM | POA: Diagnosis not present

## 2020-09-19 DIAGNOSIS — K8020 Calculus of gallbladder without cholecystitis without obstruction: Secondary | ICD-10-CM | POA: Diagnosis not present

## 2020-09-19 DIAGNOSIS — R1909 Other intra-abdominal and pelvic swelling, mass and lump: Secondary | ICD-10-CM | POA: Diagnosis not present

## 2020-09-19 DIAGNOSIS — K5790 Diverticulosis of intestine, part unspecified, without perforation or abscess without bleeding: Secondary | ICD-10-CM | POA: Diagnosis not present

## 2020-09-19 LAB — POCT I-STAT CREATININE: Creatinine, Ser: 0.7 mg/dL (ref 0.44–1.00)

## 2020-09-19 MED ORDER — IOHEXOL 350 MG/ML SOLN
80.0000 mL | Freq: Once | INTRAVENOUS | Status: AC | PRN
  Administered 2020-09-19: 80 mL via INTRAVENOUS

## 2020-09-19 NOTE — Telephone Encounter (Signed)
LMOM about the rybelsus, DFK wanted to know if pt wanted a stronger dose or if she wanted [place] to send in the '7mg'$  tablet and pt can take half.

## 2020-09-20 ENCOUNTER — Other Ambulatory Visit: Payer: Self-pay

## 2020-09-20 MED ORDER — RYBELSUS 7 MG PO TABS: 7.0000 mg | ORAL_TABLET | Freq: Every day | ORAL | 1 refills | Status: DC

## 2020-09-20 NOTE — Telephone Encounter (Signed)
Spoke with pt, explained Dr. Humphrey Rolls said she could take Rybelsus '7mg'$  once a day but if '7mg'$  seemed too much she can take half. Sent med to pharmacy.

## 2020-09-21 ENCOUNTER — Encounter: Payer: Self-pay | Admitting: Obstetrics and Gynecology

## 2020-09-21 ENCOUNTER — Inpatient Hospital Stay: Payer: Medicare HMO | Attending: Obstetrics and Gynecology | Admitting: Obstetrics and Gynecology

## 2020-09-21 VITALS — BP 164/95 | HR 82 | Temp 97.8°F | Resp 20 | Wt 238.0 lb

## 2020-09-21 DIAGNOSIS — C541 Malignant neoplasm of endometrium: Secondary | ICD-10-CM | POA: Diagnosis not present

## 2020-09-21 DIAGNOSIS — Z923 Personal history of irradiation: Secondary | ICD-10-CM | POA: Insufficient documentation

## 2020-09-21 DIAGNOSIS — Z90710 Acquired absence of both cervix and uterus: Secondary | ICD-10-CM | POA: Insufficient documentation

## 2020-09-21 DIAGNOSIS — R599 Enlarged lymph nodes, unspecified: Secondary | ICD-10-CM | POA: Diagnosis not present

## 2020-09-21 DIAGNOSIS — Z90722 Acquired absence of ovaries, bilateral: Secondary | ICD-10-CM | POA: Diagnosis not present

## 2020-09-21 NOTE — Patient Instructions (Signed)
[phone] - Counseling Services with [employer]

## 2020-09-21 NOTE — Progress Notes (Signed)
Gynecologic Oncology Interval Visit   Referring Provider: Dr. Kenton Kingfisher  Chief Complaint: 1A mixed clear cell/endometrioid endometrial carcinoma the endometrium status post TLH/BSO, radiation  Subjective:  Cassidy Parsons is a 75 y.o. 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 Initial pathology from endometrial biopsy was reviewed at [facility] and confirmed clear cell carcinoma. She returns to clinic for repeat vaginal exam and discussion of her CT scan.   09/19/2020 CT ABDOMEN AND PELVIS WITH CONTRAST  Vascular/Lymphatic: The aorta is normal in caliber, and the central aspect of its branch vessels are patent. Soft tissue nodule at the right aspect of the aorta, anterior to the right ureter, measuring up to 10 x 8 x 24 mm (series 2, image 49 and series 6, image 118), which does not fill with contrast on the delayed sequence.   IMPRESSION: 1. Soft tissue nodule at the right aspect of the aorta, anterior to the right ureter, which may represent an enlarged lymph node or lymphocele. Attention on follow-up. 2. Status post hysterectomy with no definite adnexal lesions seen.      She is anxious about her visit today. She does not have any gynecologic symptoms. She does have chronic back pain and was diagnosed with DJD. She expressed being sad. She is especially sad about her relationship with her daughter who lives in [place].    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  Patient has ~ 5 year history of PMB, worse since May 2021 after she fell on her stomach. Endorses abdominal pain radiates from left side around front to right pelvis and back. On 7/26 underwent endometrial biopsy.   Pap 08/03/2019- NILM  Pathology:   A. Endometrium, Biopsy:  - Clear cell carcinoma, FIGO grade 3   Husband died in 2022-04-16 and she delayed self care because she was taking care of him.   CT scan C/A/P 08/07/19 did not show metastatic disease. Impression:  1. Heterogeneous and thickened appearance of the endometrium in keeping with patients known primary malignancy.  2. No definite evidence of metastatic disease in the chest, abdomen, or pelvis.  3. Bilateral pulmonary nodules, nonspecific. Recommend continued attention on follow-up.  4. Mild thickening of the sigmoid colon and tethering of the superior bladder wall, favored sequela of prior diverticulitis.   Underwent TLH, BSx/RO SLN mapping at [facility] 08/11/19.  Grade 2 endometrioid 4.2 cm cancer with 26% invasion.  Negative right adnexa and cervix and no LVSI.  Left adnexa could not be removed due to dense adhesions of sigmoid colon secondary to severe diverticular disease.   Comment: The tumor demonstrates foci of well-formed endometrioid glands with defined nuclear polarity, but a considerable portion of the tumor appears solid, with large areas of squamous differentiation.  Morphologic features of clear cell carcinoma, such as marked nuclear atypia, hobnail architecture, hyalinized stroma, and intracytoplasmic eosinophilic globules, are not identified.  Secretory change is present in some cells, which stain positive for the markers Napsin A and AMACR.  The reported history of a prior biopsy with clear cell carcinoma is noted.  Review of this biopsy may be beneficial to exclude the possibility of a mixed clear cell/endometrioid tumor, if this diagnosis would influence patient management.  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  Slides were reviewed at [facility]. Dr. Fransisca Connors recommended adjuvant WPRT.   10/20/19-11/23/19 completed WPRT . Suffered diarrhea and fatigue during treatment and she has a h/o urinary frequency unrelieved by oxybutynin.    Clinically NED.    History of pulmonary nodules:  Previous imaging showed bilateral pulmonary nodules, 2-3 mm nonspecific on CT scan. Per patient, results were reassuring.  [facility] radiology regarding read on 07/18/20 CT scan. No further follow up needed given stability.  CT Chest 07/2020  IMPRESSION: 1. Pulmonary nodules are unchanged compared to the outside CT of 08/07/2019 and are considered benign. 2.  No acute process or evidence of metastatic disease in the chest. 3. Coronary artery atherosclerosis. Aortic Atherosclerosis (ICD10-I70.0). 4. Cholelithiasis.  Problem List: Patient Active Problem List   Diagnosis Date Noted   Primary clear cell adenocarcinoma of endometrium ([place]) 12/23/2019   Morbid obesity with BMI of 45.0-49.9, adult ([place]) 08/12/2019   Urinary tract infection without hematuria 08/07/2018   Dysuria 08/07/2018   Osteoarthritis of knee 04/11/2017   Low back pain 02/05/2017   Mild persistent asthma, uncomplicated 83/38/2505   Rhinitis, allergic 02/05/2017   GAD (generalized anxiety disorder) 02/05/2017   Unspecified complication of internal orthopedic prosthetic device, implant and graft, initial encounter ([place]) 02/05/2017   Diabetes mellitus without complication ([place]) 39/76/7341   Gout, unspecified 02/05/2017   Occlusion and stenosis of bilateral carotid arteries 02/05/2017   Insomnia, unspecified 02/05/2017   Sleep apnea 02/05/2017   Elevated blood-pressure reading without diagnosis of hypertension 02/05/2017   Dupuytren's disease of palm 10/05/2016   Exposed orthopaedic hardware ([place]) 02/10/2015    Past Medical History: Past Medical History:  Diagnosis Date   Asthma    Brain tumor (benign) ([place])    right side   Diabetes mellitus without complication ([place])    borderline   Early cataracts, bilateral    Environmental and seasonal allergies    GERD (gastroesophageal reflux disease)    Hypertension    PMH:   On home oxygen therapy    pt is no  longer  using oxygen at night   Seizures ([place])    haven't had a seizure since brain surgery in 2001   Uterine cancer [facility])     Past Surgical History: Past Surgical History:  Procedure Laterality Date   BRAIN SURGERY     BREAST SURGERY     biopsy right breast   CRANIOTOMY N/A 02/10/2015   Procedure: Removal of cranial plate with scalp wound revision;  Surgeon: Earnie Larsson, MD;  Location: [facility];  Service: Neurosurgery;  Laterality: N/A;   DIAGNOSTIC LAPAROSCOPY     panniculectomy   PARTIAL HYSTERECTOMY     TONSILLECTOMY      Past Gynecologic History:  Menarche: age 36-11 Post menopausal Post menopausal bleeding    OB History:  OB History  Gravida Para Term Preterm AB Living  _0 SAB IAB Ectopic Multiple Live Births               # Outcome Date GA Lbr Len/2nd Weight Sex Delivery Anes PTL Lv  2 AB           1 Term             Family History: Family History  Problem Relation Age of Onset   Cancer Mother    Heart failure Father    Stroke Sister    Diabetes Other     Social History: Social History   Socioeconomic History  Marital status: Widowed    Spouse name: Not on file   Number of children: Not on file   Years of education: Not on file   Highest education level: Not on file  Occupational History   Not on file  Tobacco Use   Smoking status: Former    Types: Cigarettes   Smokeless tobacco: Never   Tobacco comments:    quit smoking cigarettes 34 years ago  Vaping Use   Vaping Use: Never used  Substance and Sexual Activity   Alcohol use: No   Drug use: No   Sexual activity: Not on file  Other Topics Concern   Not on file  Social History Narrative   Not on file   Social Determinants of Health   Financial Resource Strain: Not on file  Food Insecurity: Not on file  Transportation Needs: Not on file  Physical Activity: Not on file  Stress: Not on file  Social Connections: Not on file  Intimate Partner Violence: Not on file    Immunization History  Administered Date(s) Administered   Influenza-Unspecified 10/22/2016, 09/21/2019   Moderna SARS-COV2 Booster Vaccination 12/15/2019   Moderna Sars-Covid-2 Vaccination 03/02/2019, 03/31/2019, 06/28/2020   Pneumococcal Conjugate-13 07/23/2015   Pneumococcal Polysaccharide-23 02/18/2018   Zoster, Live 10/16/2012   Zoster, Unspecified 10/16/2012   Allergies: Allergies  Allergen Reactions   Dust Mite Extract    Mold Extract  [Trichophyton]    Molds & Smuts Other (See Comments)   Pollen Extract    Roxanol [Morphine] Other (See Comments)    Extreme pain   Meperidine Rash    Itching   Meperidine And Related Itching and Rash   Other Itching, Rash and Other (See Comments)    All pain relievers except hydocodone Trees, mold, dust, and perfume allergies (sneezing, eyes swell up)   Propoxyphene Itching and Rash   Tape Itching and Rash    With paper tape.  Please use adhesive tape Rash   Tramadol Itching and Rash    Current Medications: Current Outpatient Medications  Medication Sig Dispense Refill   aspirin EC 81 MG tablet Take 81 mg by mouth daily.     azelastine (ASTELIN) 0.1 % nasal spray PLACE 1 SPRAY INTO EACH NOSTRIL DAILY ASDIRECTED. 30 mL 3   Calcium Carb-Cholecalciferol 600-200 MG-UNIT TABS Take 1 tablet by mouth daily.     cetirizine (ZYRTEC) 10 MG tablet Take 10 mg by mouth daily.     esomeprazole (NEXIUM) 40 MG capsule Take 40 mg by mouth daily at 12 noon.     fluocinonide (LIDEX) 0.05 % external solution Apply 1 application topically 2 (two) times daily. 60 mL 1   fluticasone (FLONASE) 50 MCG/ACT nasal spray Place 1 spray into both nostrils daily.     ketoconazole (NIZORAL) 2 % shampoo Apply 1 application topically 2 (two) times a week. 120 mL 1   montelukast (SINGULAIR) 10 MG tablet TAKE (1) TABLET BY MOUTH DAILY AT BEDTIME 90 tablet 1   Multiple Vitamins-Minerals (MULTIVITAMIN ADULT PO) Take 1 tablet by mouth daily.     oxybutynin (DITROPAN) 5  MG tablet TAKE (1) TABLET BY MOUTH EVERY DAY 90 tablet 0   potassium citrate (UROCIT-K) 10 MEQ (1080 MG) SR tablet Take 1 tablet by mouth twice daily 180 tablet 1   Semaglutide (RYBELSUS) 7 MG TABS Take 7 mg by mouth daily. 90 tablet 1   No current facility-administered medications for this visit.   Review of Systems General:  no complaints Skin: no complaints Eyes:  no complaints HEENT: no complaints Breasts: no complaints Pulmonary: no complaints Cardiac: no complaints Gastrointestinal: no complaints Genitourinary/Sexual: no complaints Ob/Gyn: no complaints Musculoskeletal: no complaints Hematology: no complaints Neurologic/Psych: depression & anxiety   Objective:  Physical Examination:  BP (!) 164/95   Pulse 82   Temp 97.8 F (36.6 C)   Resp 20   Wt 238 lb (108 kg)   SpO2 100%   BMI 46.48 kg/m     ECOG Performance Status: 1 - Symptomatic but completely ambulatory  GENERAL: Patient is a well appearing female in no acute distress. Unaccompanied.  HEENT:  Sclera clear. Anicteric NODES:  Negative axillary, supraclavicular, inguinal lymph node survery LUNGS:  Clear to auscultation bilaterally.   HEART:  Regular rate and rhythm.  ABDOMEN:  Soft, nontender.  No hernias, incisions well healed. No masses or ascites EXTREMITIES:  No peripheral edema. Atraumatic. No cyanosis SKIN:  Clear with no obvious rashes or skin changes.  NEURO:  Nonfocal. Well oriented.  Appropriate affect.   Pelvic: chaparoned by CMA EGBUS: no lesions Cervix: surgically absent  Vagina: no lesions, no discharge or bleeding. Mild agglutination.  Uterus: surgically absent  Adnexa: On palpation thickening of the vaginal cuff in the midline. But no definite mass - Rectovaginal: deferred    Radiology:  09/19/2020  EXAM: CT ABDOMEN AND PELVIS WITH CONTRAST   TECHNIQUE: Multidetector CT imaging of the abdomen and pelvis was performed using the standard protocol following bolus administration  of intravenous contrast.   CONTRAST:  55m OMNIPAQUE IOHEXOL 350 MG/ML SOLN   COMPARISON:  04/16/2004.   FINDINGS: Lower chest: No acute abnormality.   Hepatobiliary: The liver is normal in contour. No intra or extrahepatic biliary ductal dilatation. The hepatic and portal veins are patent. Cholelithiasis without evidence cholecystitis.   Pancreas: Unremarkable. No pancreatic ductal dilatation or surrounding inflammatory changes.   Spleen: Normal in size without focal abnormality.   Adrenals/Urinary Tract: The adrenal glands are unremarkable. The kidneys enhance symmetrically with no hydronephrosis or nephrolithiasis. The bladder is unremarkable.   Stomach/Bowel: Stomach is within normal limits. No evidence of bowel wall thickening, distention, or inflammatory changes. Diverticulosis without evidence of diverticulitis.   Vascular/Lymphatic: The aorta is normal in caliber, and the central aspect of its branch vessels are patent. Soft tissue nodule at the right aspect of the aorta, anterior to the right ureter, measuring up to 10 x 8 x 24 mm (series 2, image 49 and series 6, image 118), which does not fill with contrast on the delayed sequence. No other suspected lymphadenopathy.   Reproductive: Status post hysterectomy and right oophorectomy. No right adnexal mass. A structure in the left adnexa is favored to represent the left ovary.   Other: No free fluid or free air in the abdomen or pelvis.   Musculoskeletal: Degenerative changes in the imaged spine. No acute osseous abnormality.   IMPRESSION: 1. Soft tissue nodule at the right aspect of the aorta, anterior to the right ureter, which may represent an enlarged lymph node or lymphocele. Attention on follow-up. 2. Status post hysterectomy with no definite adnexal lesions seen.     Electronically Signed   By: AMerilyn BabaM.D.   On: 09/19/2020 18:40    Assessment:  BVANESSIA BOKHARIis a 75y.o. female diagnosed  with 1A mixed clear cell/endometrioid endometrial carcinoma (MSI; dMMR with loss of MLH1/PMS2 with methylated MLH1 alleles present) s/p TLH, RSO 08/11/19. SLN mapping unsuccessful and left adnexa could not be removed due to severe adhesions from  diverticular disease.  No LVSI, or cervical/adnexal involvement and invades 23% of the myometrium. Pre-op CT scan negative for metastatic disease.   On last exam concern for possible vaginal cuff mass ore this could represent left adnexa. Prompted CT scan that was negative other than 2.4 cm nodule most likely lymph node.    Pulmonary nodules, 07/2019 on CT chest, on follow up CT at [place] 2022 report stable. No additional imaging was recommended.   Depression and anxiety  Medical co-morbidities complicating care: HTN, morbid obesity (Body mass index is 46.48 kg/m.), prediabetes, possible h/o carotid stenosis.  Plan:   Problem List Items Addressed This Visit       Genitourinary   Primary clear cell adenocarcinoma of endometrium ([place]) - Primary   Relevant Orders   CT Abdomen Pelvis W Contrast   Other Visit Diagnoses     Enlarged lymph nodes           I discussed options to follow up the nodule noted on CT scan. I suspect this is a lymph node. Options include observation with repeat CT imaging in 3 months, IR guided biopsy, and PET scan. She is having difficulty with financial toxicity. We discussed goals of care. She understands this may represent recurrence. After our discussion plan is to obtain CT A/P in 3 months. If she has any symptoms or concerns she will contact us. We will also have a visit in clinic with pelvic exam at that time. She agrees with this plan.   She seems very depressed and we offered counseling services which she accepted. Information was provided and Beckey Rutter, NP will help her.   Reschedule RTC 3 months for continued surveillance. Recommend close surveillance for at least 5 years.   I personally had a face to face  interaction and evaluated the patient jointly with the NP, Ms. Beckey Rutter.  I have reviewed her history and available records and have performed the key portions of the physical exam including HEENT, and general. abdominal exam, pelvic exam with my findings confirming those documented above by the APP.  I have discussed the case with the APP and the patient.  I agree with the above documentation, assessment and plan which was fully formulated by me.  Counseling was completed by me.   I personally saw the patient and performed a substantive portion of this encounter in conjunction with the listed APP as documented above.  Idriss Quackenbush Gaetana Michaelis, MD

## 2020-10-10 ENCOUNTER — Other Ambulatory Visit: Payer: Self-pay

## 2020-10-10 MED ORDER — KETOCONAZOLE 2 % EX SHAM: 1.0000 "application " | MEDICATED_SHAMPOO | CUTANEOUS | 1 refills | Status: DC

## 2020-10-24 ENCOUNTER — Telehealth: Payer: Self-pay

## 2020-10-24 ENCOUNTER — Other Ambulatory Visit: Payer: Self-pay

## 2020-10-24 MED ORDER — ATORVASTATIN CALCIUM 10 MG PO TABS: ORAL_TABLET | ORAL | 0 refills | Status: DC

## 2020-10-24 NOTE — Telephone Encounter (Signed)
Spoke with pt that due recommended from her insurance for statin therapy due to she is diabetic advised as per dr Humphrey Rolls we will send Lipitor take 1 tab po twice a week and send 30 tab pt like to try first and then she call us back for refills

## 2020-11-08 ENCOUNTER — Telehealth: Payer: Self-pay | Admitting: Internal Medicine

## 2020-11-08 ENCOUNTER — Telehealth: Payer: Self-pay | Admitting: Pharmacist

## 2020-11-08 NOTE — Progress Notes (Signed)
 Chronic Care Management Pharmacy Note  11/09/2020 Name:  Cassidy Parsons MRN:  [id number] DOB:  08/04/1945  Summary: Initial visit with PharmD.  She is tolerating Lipitor well.  She has active labs, recommended she go complete these ASAP.  Also assisting her with patient assistance on Rybelsus and with her Advair that she was previously on.  She reported this helps and has had SOB since she stopped.  Last bone density was in 2020.  She had a full rx of Actonel with her but not taking it.  Recommendations/Changes made from today's visit: Recommend check lipids and consider increase to high intensity statin based on ASCVD risk of 34.3%. Recommend schedule for repeat bone density scan. Consider restarting Advair if she qualifies for PAP.  Plan: FU 4 months   Subjective: Cassidy Parsons is an 75 y.o. year old female who is a primary patient of Khan, Fozia Parsons, Parsons.  The CCM team was consulted for assistance with disease management and care coordination needs.    Engaged with patient by telephone for initial visit in response to provider referral for pharmacy case management and/or care coordination services.   Consent to Services:  The patient was given the following information about Chronic Care Management services today, agreed to services, and gave verbal consent: 1. CCM service includes personalized support from designated clinical staff supervised by the primary care provider, including individualized plan of care and coordination with other care providers 2. 24/7 contact phone numbers for assistance for urgent and routine care needs. 3. Service will only be billed when office clinical staff spend 20 minutes or more in a month to coordinate care. 4. Only one practitioner may furnish and bill the service in a calendar month. 5.The patient may stop CCM services at any time (effective at the end of the month) by phone call to the office staff. 6. The patient will be responsible for cost  sharing (co-pay) of up to 20% of the service fee (after annual deductible is met). Patient agreed to services and consent obtained.  Patient Care Team: Khan, Fozia Parsons, Parsons as PCP - General (Internal Medicine) Cassidy Parsons, RPH as Pharmacist (Pharmacist)  Recent office visits:  08/29/20 Dr. Khan For follow-up. No medication changes.    Recent consult visits:  08/24/20 Oncology Cassidy Parsons, Cassidy Parsons. For follow-up. No medication changes.  07/15/20 Orthopedic Surgery Cassidy Parsons. No more information given. 05/19/20 Orthopedic Surgery Cassidy Parsons No more information given. 05/11/20 Rad Onc Chrystal, Glenn, Parsons For follow-up/Endometrial cancer. No more information given.   Hospital visits:  None in the last six months   Medication History: Atorvastatin 10 mg 10/24/20 98 DS.   Objective:  Lab Results  Component Value Date   CREATININE 0.70 09/19/2020   BUN 16 11/10/2018   GFRNONAA 79 11/10/2018   GFRAA 91 11/10/2018   NA 141 11/10/2018   K 4.4 11/10/2018   CALCIUM 9.3 11/10/2018   CO2 25 11/10/2018   GLUCOSE 113 (H) 11/10/2018    Lab Results  Component Value Date/Time   HGBA1C 6.9 (A) 08/29/2020 12:17 PM   HGBA1C 7.1 (A) 11/16/2019 11:53 AM   HGBA1C 5.8 (H) 02/10/2015 07:04 AM   MICROALBUR 10 08/29/2020 12:17 PM    Last diabetic Eye exam: No results found for: HMDIABEYEEXA  Last diabetic Foot exam: No results found for: HMDIABFOOTEX   Lab Results  Component Value Date   CHOL 155 11/10/2018   HDL 54 11/10/2018   LDLCALC 81 11/10/2018     TRIG 110 11/10/2018    Hepatic Function Latest Ref Rng & Units 11/10/2018 09/24/2017  Total Protein 6.0 - 8.5 g/dL 6.8 6.9  Albumin 3.7 - 4.7 g/dL 3.6(Parsons) 3.9  AST 0 - 40 IU/Parsons 20 21  ALT 0 - 32 IU/Parsons 16 17  Alk Phosphatase 39 - 117 IU/Parsons 93 90  Total Bilirubin 0.0 - 1.2 mg/dL 0.4 0.4    Lab Results  Component Value Date/Time   TSH 1.010 11/10/2018 11:41 AM   TSH 1.320 09/24/2017 10:53 AM   FREET4 1.49 11/10/2018 11:41 AM    FREET4 1.45 09/24/2017 10:53 AM    CBC Latest Ref Rng & Units 11/20/2019 11/13/2019 11/06/2019  WBC 4.0 - 10.5 K/uL 4.7 5.1 4.4  Hemoglobin 12.0 - 15.0 g/dL 13.3 13.5 13.4  Hematocrit 36.0 - 46.0 % 40.4 41.8 41.2  Platelets 150 - 400 K/uL 227 195 185    No results found for: VD25OH  Clinical ASCVD: No  The 10-year ASCVD risk score (Arnett DK, et al., 2019) is: 34.3%   Values used to calculate the score:     Age: 75 years     Sex: Female     Is Non-Hispanic African American: No     Diabetic: Yes     Tobacco smoker: No     Systolic Blood Pressure: 164 mmHg     Is BP treated: No     HDL Cholesterol: 54 mg/dL     Total Cholesterol: 155 mg/dL    Depression screen PHQ 2/9 05/05/2020 11/16/2019 11/10/2018  Decreased Interest 0 0 0  Down, Depressed, Hopeless - 0 0  PHQ - 2 Score 0 0 0    Social History   Tobacco Use  Smoking Status Former   Types: Cigarettes  Smokeless Tobacco Never  Tobacco Comments   quit smoking cigarettes 34 years ago   BP Readings from Last 3 Encounters:  09/21/20 (!) 164/95  08/29/20 (!) 142/80  08/24/20 (!) 142/81   Pulse Readings from Last 3 Encounters:  09/21/20 82  08/29/20 71  08/24/20 87   Wt Readings from Last 3 Encounters:  09/21/20 238 lb (108 kg)  08/29/20 241 lb 6.4 oz (109.5 kg)  08/24/20 242 lb (109.8 kg)   BMI Readings from Last 3 Encounters:  09/21/20 46.48 kg/Parsons  08/29/20 47.15 kg/Parsons  08/24/20 47.26 kg/Parsons    Assessment/Interventions: Review of patient past medical history, allergies, medications, health status, including review of consultants reports, laboratory and other test data, was performed as part of comprehensive evaluation and provision of chronic care management services.   SDOH:  (Social Determinants of Health) assessments and interventions performed: Yes  Financial Resource Strain: Low Risk    Difficulty of Paying Living Expenses: Not very hard    SDOH Screenings   Alcohol Screen: Not on file  Depression  (PHQ2-9): Low Risk    PHQ-2 Score: 0  Financial Resource Strain: Low Risk    Difficulty of Paying Living Expenses: Not very hard  Food Insecurity: Not on file  Housing: Not on file  Physical Activity: Not on file  Social Connections: Not on file  Stress: Not on file  Tobacco Use: Medium Risk   Smoking Tobacco Use: Former   Smokeless Tobacco Use: Never   Passive Exposure: Not on file  Transportation Needs: Not on file    CCM Care Plan  Allergies  Allergen Reactions   Dust Mite Extract    Mold Extract  [Trichophyton]    Molds & Smuts Other (See Comments)     Pollen Extract    Roxanol [Morphine] Other (See Comments)    Extreme pain   Meperidine Rash    Itching   Meperidine And Related Itching and Rash   Other Itching, Rash and Other (See Comments)    All pain relievers except hydocodone Trees, mold, dust, and perfume allergies (sneezing, eyes swell up)   Propoxyphene Itching and Rash   Tape Itching and Rash    With paper tape.  Please use adhesive tape Rash   Tramadol Itching and Rash    Medications Reviewed Today     Reviewed by Edythe Clarity, [facility] (Pharmacist) on 11/09/20 at 1014  Med List Status: <None>   Medication Order Taking? Sig Documenting Provider Last Dose Status Informant  aspirin EC 81 MG tablet [id number] Yes Take 81 mg by mouth daily. Provider, Historical, Parsons Taking Active Self  atorvastatin (LIPITOR) 10 MG tablet [id number] Yes Take  1 tab po twice a week Lavera Guise, Parsons Taking Active   azelastine (ASTELIN) 0.1 % nasal spray [id number] Yes PLACE 1 SPRAY INTO EACH NOSTRIL DAILY ASDIRECTED. Luiz Ochoa, NP Taking Active   Calcium Carb-Cholecalciferol 600-200 MG-UNIT TABS [id number] Yes Take 1 tablet by mouth daily. Provider, Historical, Parsons Taking Active Self  cetirizine (ZYRTEC) 10 MG tablet [id number] Yes Take 10 mg by mouth daily. Provider, Historical, Parsons Taking Active Self  esomeprazole (NEXIUM) 40 MG capsule [id number] Yes Take 40 mg by mouth daily  at 12 noon. Provider, Historical, Parsons Taking Active Self  fluocinonide (LIDEX) 0.05 % external solution [id number] Yes Apply 1 application topically 2 (two) times daily. Lavera Guise, Parsons Taking Active   fluticasone [facility]) 50 MCG/ACT nasal spray [id number] Yes Place 1 spray into both nostrils daily. Provider, Historical, Parsons Taking Active   ketoconazole (NIZORAL) 2 % shampoo [id number] Yes Apply 1 application topically 2 (two) times a week. Lavera Guise, Parsons Taking Active   montelukast (SINGULAIR) 10 MG tablet [id number] Yes TAKE (1) TABLET BY MOUTH DAILY AT BEDTIME Lavera Guise, Parsons Taking Active   Multiple Vitamins-Minerals (MULTIVITAMIN ADULT PO) [id number] Yes Take 1 tablet by mouth daily. Provider, Historical, Parsons Taking Active Self  oxybutynin (DITROPAN) 5 MG tablet [id number] Yes TAKE (1) TABLET BY MOUTH EVERY DAY Lavera Guise, Parsons Taking Active   potassium citrate (UROCIT-K) 10 MEQ (1080 MG) SR tablet [id number] Yes Take 1 tablet by mouth twice daily Lavera Guise, Parsons Taking Active   Semaglutide [facility]) 7 MG TABS [id number] Yes Take 7 mg by mouth daily. Lavera Guise, Parsons Taking Active             Patient Active Problem List   Diagnosis Date Noted   Primary clear cell adenocarcinoma of endometrium ([place]) 12/23/2019   Morbid obesity with BMI of 45.0-49.9, adult ([place]) 08/12/2019   Urinary tract infection without hematuria 08/07/2018   Dysuria 08/07/2018   Osteoarthritis of knee 04/11/2017   Low back pain 02/05/2017   Mild persistent asthma, uncomplicated 16/55/3748   Rhinitis, allergic 02/05/2017   GAD (generalized anxiety disorder) 02/05/2017   Unspecified complication of internal orthopedic prosthetic device, implant and graft, initial encounter ([place]) 02/05/2017   Diabetes mellitus without complication ([place]) 27/07/8673   Gout, unspecified 02/05/2017   Occlusion and stenosis of bilateral carotid arteries 02/05/2017   Insomnia, unspecified 02/05/2017   Sleep apnea 02/05/2017    Elevated blood-pressure reading without diagnosis of hypertension 02/05/2017   Dupuytren's disease of palm 10/05/2016   Exposed orthopaedic hardware ([place]) 02/10/2015  Immunization History  Administered Date(s) Administered   Influenza-Unspecified 10/22/2016, 09/21/2019   Moderna SARS-COV2 Booster Vaccination 12/15/2019   Moderna Sars-Covid-2 Vaccination 03/02/2019, 03/31/2019, 06/28/2020   Pneumococcal Conjugate-13 07/23/2015   Pneumococcal Polysaccharide-23 02/18/2018   Zoster, Live 10/16/2012   Zoster, Unspecified 10/16/2012    Conditions to be addressed/monitored:  Asthma, DM, GAD/Insomnia, Gout, Osteoarthritis  Care Plan : General Pharmacy (Adult)  Updates made by Edythe Clarity, RPH since 11/09/2020 12:00 AM     Problem: Asthma, DM, GAD/Insomnia, Gout, Osteoarthritis   Priority: High  Onset Date: 11/09/2020     Long-Range Goal: Patient-Specific Goal   Start Date: 11/09/2020  Expected End Date: 05/09/2021  This Visit's Progress: On track  Priority: High  Note:   Current Barriers:  Unable to independently afford treatment regimen Unable to independently monitor therapeutic efficacy  Pharmacist Clinical Goal(s):  Patient will verbalize ability to afford treatment regimen maintain control of glucose as evidenced by A1c  adhere to plan to optimize therapeutic regimen for Asthma/DM as evidenced by report of adherence to recommended medication management changes through collaboration with PharmD and provider.   Interventions: 1:1 collaboration with Lavera Guise, Parsons regarding development and update of comprehensive plan of care as evidenced by provider attestation and co-signature Inter-disciplinary care team collaboration (see longitudinal plan of care) Comprehensive medication review performed; medication list updated in electronic medical record  Diabetes (A1c goal <7%) -Controlled -Current medications: Rybelsus 44m - one half tablet po daily -Medications  previously tried: metformin (GI issues)  -Current home glucose readings fasting glucose: Not checking post prandial glucose: not checking -Denies hypoglycemic/hyperglycemic symptoms -Current meal patterns: she has cut out sodas and sweets completely from her diet.  Working on cutting back on portion sizes. -Current exercise: goes to the gym 2-3 times per week,  Working on implementing cardiovascular activity -Educated on A1c and blood sugar goals; Exercise goal of 150 minutes per week; Benefits of routine self-monitoring of blood sugar; Carbohydrate counting and/or plate method -Counseled to check feet daily and get yearly eye exams -Recommended to continue current medication Assessed patient finances. She is having issues witht he copay currently reports $141 per month.  Income is well below threshold for patient assistance.  Sent home with application - she is to get proof of income and return.  Upon completion would recommend increasing dose to the full 768mdaily.  She was pill splitting to save money.  Depression/Anxiety (Goal: Minimize symptoms) -Controlled -Current treatment: None at this time -Medications previously tried/failed: none -PHQ9:  PHQ9 SCORE ONLY 05/05/2020 11/16/2019 11/10/2018  PHQ-9 Total Score 0 0 0  -Recommended to continue current medication Counseled her ot let me know during upcoming cancer screening if she needs anything regarding medication.  Gout (Goal: Minimize symptoms) -Not ideally controlled -Current treatment  Potassium Citrate 1062mtwice daily -Medications previously tried: none noted -Not taking allopruinol for gout. She does reports issues a few times per month with gout.  No recent uric acid level.  -Recommended to continue current medication May benefit from uric acid level, potentially from addition of allopurinol.   Asthma (Goal: Minimize symptoms) -Not ideally controlled -Current treatment  Montelukast 59m71m -Medications previously  tried: Advair ($)  -She reports frequent episodes of SOB. She was previously on advair as maintenance, however she had to stop due to cost concerns.  -Assessed patient finances. I believe she would qualify for copay assistance through manufacturer.  If so, would recommend she restart daily Advair for asthma.  Patient Goals/Self-Care Activities  Patient will:  - take medications as prescribed as evidenced by patient report and record review focus on medication adherence by pill box collaborate with provider on medication access solutions  Follow Up Plan: The care management team will reach out to the patient again over the next 120 days.        Medication Assistance: Application for Rybelsus  medication assistance program. in process.  Anticipated assistance start date 12/08/20.  See plan of care for additional detail.  Compliance/Adherence/Medication fill history: Care Gaps: Tdap, Zoster  Star-Rating Drugs: Atorvastatin 10 mg 10/24/20 90 DS.  Patient's preferred pharmacy is:  [id number] - [place], [place] - [address] [place] [address] Phone: [phone] Fax: [phone]  [facility] - [place], [place] - [address] [place] [address] Phone: [phone] Fax: [phone]  Uses pill box? Yes Pt endorses 100% compliance  We discussed: Benefits of medication synchronization, packaging and delivery as well as enhanced pharmacist oversight with [employer]. Patient decided to: Continue current medication management strategy  Care Plan and Follow Up Patient Decision:  Patient agrees to Care Plan and Follow-up.  Plan: The care management team will reach out to the patient again over the next 120 days.  Christian Davis, PharmD Clinical Pharmacist [facility] [phone]    

## 2020-11-08 NOTE — Progress Notes (Signed)
  Chronic Care Management Pharmacy Assistant   Name: Cassidy Parsons  MRN: [id number] DOB: 1945/08/16  Cassidy Parsons is an 75 y.o. year old female who presents for his initial CCM visit with the clinical pharmacist.  Reason for Encounter: Chart Prep    Conditions to be addressed/monitored: DM, Insomnia   Primary concerns for visit include: DM.  Recent office visits:  08/29/20 Dr. Humphrey Rolls For follow-up. No medication changes.   Recent consult visits:  08/24/20 Oncology Santiago Glad, Philbert Riser, MD. For follow-up. No medication changes.  07/15/20 Orthopedic Surgery Lamount Cranker D. No more information given. 05/19/20 Orthopedic Surgery Darr, Edison Nasuti No more information given. 05/11/20 Rad Onc Noreene Filbert, MD For follow-up/Endometrial cancer. No more information given.  Hospital visits:  None in the last six months  Medication History: Atorvastatin 10 mg 10/24/20 98 DS.  Medications: Outpatient Encounter Medications as of 11/08/2020  Medication Sig   aspirin EC 81 MG tablet Take 81 mg by mouth daily.   atorvastatin (LIPITOR) 10 MG tablet Take  1 tab po twice a week   azelastine (ASTELIN) 0.1 % nasal spray PLACE 1 SPRAY INTO EACH NOSTRIL DAILY ASDIRECTED.   Calcium Carb-Cholecalciferol 600-200 MG-UNIT TABS Take 1 tablet by mouth daily.   cetirizine (ZYRTEC) 10 MG tablet Take 10 mg by mouth daily.   esomeprazole (NEXIUM) 40 MG capsule Take 40 mg by mouth daily at 12 noon.   fluocinonide (LIDEX) 0.05 % external solution Apply 1 application topically 2 (two) times daily.   fluticasone (FLONASE) 50 MCG/ACT nasal spray Place 1 spray into both nostrils daily.   ketoconazole (NIZORAL) 2 % shampoo Apply 1 application topically 2 (two) times a week.   montelukast (SINGULAIR) 10 MG tablet TAKE (1) TABLET BY MOUTH DAILY AT BEDTIME   Multiple Vitamins-Minerals (MULTIVITAMIN ADULT PO) Take 1 tablet by mouth daily.   oxybutynin (DITROPAN) 5 MG tablet TAKE (1) TABLET BY MOUTH EVERY DAY    potassium citrate (UROCIT-K) 10 MEQ (1080 MG) SR tablet Take 1 tablet by mouth twice daily   Semaglutide (RYBELSUS) 7 MG TABS Take 7 mg by mouth daily.   No facility-administered encounter medications on file as of 11/08/2020.   Have you seen any other providers since your last visit? Patient stated no.  Any changes in your medications or health? Patient stated no.   Any side effects from any medications? Patient stated no.   Do you have an symptoms or problems not managed by your medications? Patient stated no.   Any concerns about your health right now? Patient stated no.   Has your provider asked that you check blood pressure, blood sugar, or follow special diet at home? Patient stated she cut down on food intake.   Do you get any type of exercise on a regular basis? Patient stated she goes to the gym.  Can you think of a goal you would like to reach for your health? Patient stated to loose weight and get off of her diabetes medication   Do you have any problems getting your medications? Patient stated it is several medications that are costing her too much, She gave me a few Rybelsus , Advair inhaler, Urocit-K.   Is there anything that you would like to discuss during the appointment? Patient stated no.  Please bring medications and supplements to appointment, patient reminded of her appointment on 11/09/20 at 9 am.   [place], [place] Pharmacist Assistant [phone]

## 2020-11-08 NOTE — Chronic Care Management (AMB) (Signed)
  Chronic Care Management   Note  11/08/2020 Name: Cassidy Parsons MRN: [id number] DOB: 12/01/1945  Cassidy Parsons is a 75 y.o. year old female who is a primary care patient of Lavera Guise, MD. I reached out to Joellen Jersey by phone today in response to a referral sent by Ms. Caryl Asp Quickel's PCP, Lavera Guise, MD.   Cassidy Parsons was given information about Chronic Care Management services today including:  CCM service includes personalized support from designated clinical staff supervised by her physician, including individualized plan of care and coordination with other care providers 24/7 contact phone numbers for assistance for urgent and routine care needs. Service will only be billed when office clinical staff spend 20 minutes or more in a month to coordinate care. Only one practitioner may furnish and bill the service in a calendar month. The patient may stop CCM services at any time (effective at the end of the month) by phone call to the office staff.   Patient agreed to services and verbal consent obtained.   Follow up plan:   [place] Secretary/administrator

## 2020-11-09 ENCOUNTER — Other Ambulatory Visit: Payer: Self-pay

## 2020-11-09 ENCOUNTER — Ambulatory Visit: Payer: Medicare HMO | Admitting: Pharmacist

## 2020-11-09 DIAGNOSIS — J4520 Mild intermittent asthma, uncomplicated: Secondary | ICD-10-CM

## 2020-11-09 DIAGNOSIS — E1165 Type 2 diabetes mellitus with hyperglycemia: Secondary | ICD-10-CM

## 2020-11-09 DIAGNOSIS — M109 Gout, unspecified: Secondary | ICD-10-CM

## 2020-11-09 MED ORDER — ATORVASTATIN CALCIUM 10 MG PO TABS: ORAL_TABLET | ORAL | 1 refills | Status: DC

## 2020-11-09 NOTE — Patient Instructions (Addendum)
Visit Information   Goals Addressed             This Visit's Progress    Monitor and Manage My Blood Sugar-Diabetes Type 2       Timeframe:  Long-Range Goal Priority:  High Start Date: 11/09/20                            Expected End Date:   05/09/21                    Follow Up Date 02/09/21    - check blood sugar if I feel it is too high or too low - enter blood sugar readings and medication or insulin into daily log - take the blood sugar log to all doctor visits    Why is this important?   Checking your blood sugar at home helps to keep it from getting very high or very low.  Writing the results in a diary or log helps the doctor know how to care for you.  Your blood sugar log should have the time, date and the results.  Also, write down the amount of insulin or other medicine that you take.  Other information, like what you ate, exercise done and how you were feeling, will also be helpful.     Notes:        Patient Care Plan: General Pharmacy (Adult)     Problem Identified: Asthma, DM, GAD/Insomnia, Gout, Osteoarthritis   Priority: High  Onset Date: 11/09/2020     Long-Range Goal: Patient-Specific Goal   Start Date: 11/09/2020  Expected End Date: 05/09/2021  This Visit's Progress: On track  Priority: High  Note:   Current Barriers:  Unable to independently afford treatment regimen Unable to independently monitor therapeutic efficacy  Pharmacist Clinical Goal(s):  Patient will verbalize ability to afford treatment regimen maintain control of glucose as evidenced by A1c  adhere to plan to optimize therapeutic regimen for Asthma/DM as evidenced by report of adherence to recommended medication management changes through collaboration with PharmD and provider.   Interventions: 1:1 collaboration with Lavera Guise, MD regarding development and update of comprehensive plan of care as evidenced by provider attestation and co-signature Inter-disciplinary care team  collaboration (see longitudinal plan of care) Comprehensive medication review performed; medication list updated in electronic medical record  Diabetes (A1c goal <7%) -Controlled -Current medications: Rybelsus 7mg  - one half tablet po daily -Medications previously tried: metformin (GI issues)  -Current home glucose readings fasting glucose: Not checking post prandial glucose: not checking -Denies hypoglycemic/hyperglycemic symptoms -Current meal patterns: she has cut out sodas and sweets completely from her diet.  Working on cutting back on portion sizes. -Current exercise: goes to the gym 2-3 times per week,  Working on implementing cardiovascular activity -Educated on A1c and blood sugar goals; Exercise goal of 150 minutes per week; Benefits of routine self-monitoring of blood sugar; Carbohydrate counting and/or plate method -Counseled to check feet daily and get yearly eye exams -Recommended to continue current medication Assessed patient finances. She is having issues witht he copay currently reports $141 per month.  Income is well below threshold for patient assistance.  Sent home with application - she is to get proof of income and return.  Upon completion would recommend increasing dose to the full 7mg  daily.  She was pill splitting to save money.  Depression/Anxiety (Goal: Minimize symptoms) -Controlled -Current treatment: None at this time -Medications  previously tried/failed: none -PHQ9:  PHQ9 SCORE ONLY 05/05/2020 11/16/2019 11/10/2018  PHQ-9 Total Score 0 0 0  -Recommended to continue current medication Counseled her ot let me know during upcoming cancer screening if she needs anything regarding medication.  Gout (Goal: Minimize symptoms) -Not ideally controlled -Current treatment  Potassium Citrate 7meq twice daily -Medications previously tried: none noted -Not taking allopruinol for gout. She does reports issues a few times per month with gout.  No recent uric acid  level.  -Recommended to continue current medication May benefit from uric acid level, potentially from addition of allopurinol.   Asthma (Goal: Minimize symptoms) -Not ideally controlled -Current treatment  Montelukast 10mg  hs -Medications previously tried: Advair ($)  -She reports frequent episodes of SOB. She was previously on advair as maintenance, however she had to stop due to cost concerns.  -Assessed patient finances. I believe she would qualify for copay assistance through manufacturer.  If so, would recommend she restart daily Advair for asthma.  Patient Goals/Self-Care Activities Patient will:  - take medications as prescribed as evidenced by patient report and record review focus on medication adherence by pill box collaborate with provider on medication access solutions  Follow Up Plan: The care management team will reach out to the patient again over the next 120 days.      Ms. Gonterman was given information about Chronic Care Management services today including:  CCM service includes personalized support from designated clinical staff supervised by her physician, including individualized plan of care and coordination with other care providers 24/7 contact phone numbers for assistance for urgent and routine care needs. Standard insurance, coinsurance, copays and deductibles apply for chronic care management only during months in which we provide at least 20 minutes of these services. Most insurances cover these services at 100%, however patients may be responsible for any copay, coinsurance and/or deductible if applicable. This service may help you avoid the need for more expensive face-to-face services. Only one practitioner may furnish and bill the service in a calendar month. The patient may stop CCM services at any time (effective at the end of the month) by phone call to the office staff.  Patient agreed to services and verbal consent obtained.   The patient  verbalized understanding of instructions, educational materials, and care plan provided today and agreed to receive a mailed copy of patient instructions, educational materials, and care plan.  Telephone follow up appointment with pharmacy team member scheduled for: 4 months  Edythe Clarity, [place]

## 2020-11-10 ENCOUNTER — Telehealth: Payer: Self-pay

## 2020-11-10 DIAGNOSIS — R5383 Other fatigue: Secondary | ICD-10-CM | POA: Diagnosis not present

## 2020-11-10 DIAGNOSIS — E1165 Type 2 diabetes mellitus with hyperglycemia: Secondary | ICD-10-CM | POA: Diagnosis not present

## 2020-11-10 DIAGNOSIS — E782 Mixed hyperlipidemia: Secondary | ICD-10-CM | POA: Diagnosis not present

## 2020-11-10 NOTE — Telephone Encounter (Signed)
Disability parking placard completed by provider and given to patient.

## 2020-11-11 ENCOUNTER — Telehealth: Payer: Self-pay | Admitting: Pharmacist

## 2020-11-11 LAB — CBC WITH DIFFERENTIAL/PLATELET
Basophils Absolute: 0 10*3/uL (ref 0.0–0.2)
Basos: 1 %
EOS (ABSOLUTE): 0.2 10*3/uL (ref 0.0–0.4)
Eos: 5 %
Hematocrit: 41.5 % (ref 34.0–46.6)
Hemoglobin: 14.3 g/dL (ref 11.1–15.9)
Immature Grans (Abs): 0 10*3/uL (ref 0.0–0.1)
Immature Granulocytes: 0 %
Lymphocytes Absolute: 0.8 10*3/uL (ref 0.7–3.1)
Lymphs: 20 %
MCH: 29.2 pg (ref 26.6–33.0)
MCHC: 34.5 g/dL (ref 31.5–35.7)
MCV: 85 fL (ref 79–97)
Monocytes Absolute: 0.5 10*3/uL (ref 0.1–0.9)
Monocytes: 12 %
Neutrophils Absolute: 2.5 10*3/uL (ref 1.4–7.0)
Neutrophils: 62 %
Platelets: 266 10*3/uL (ref 150–450)
RBC: 4.89 x10E6/uL (ref 3.77–5.28)
RDW: 13.2 % (ref 11.7–15.4)
WBC: 4 10*3/uL (ref 3.4–10.8)

## 2020-11-11 LAB — COMPREHENSIVE METABOLIC PANEL
ALT: 15 IU/L (ref 0–32)
AST: 20 IU/L (ref 0–40)
Albumin/Globulin Ratio: 1.4 (ref 1.2–2.2)
Albumin: 3.8 g/dL (ref 3.7–4.7)
Alkaline Phosphatase: 120 IU/L (ref 44–121)
BUN/Creatinine Ratio: 18 (ref 12–28)
BUN: 13 mg/dL (ref 8–27)
Bilirubin Total: 0.4 mg/dL (ref 0.0–1.2)
CO2: 25 mmol/L (ref 20–29)
Calcium: 9.5 mg/dL (ref 8.7–10.3)
Chloride: 102 mmol/L (ref 96–106)
Creatinine, Ser: 0.73 mg/dL (ref 0.57–1.00)
Globulin, Total: 2.8 g/dL (ref 1.5–4.5)
Glucose: 126 mg/dL — ABNORMAL HIGH (ref 70–99)
Potassium: 4.7 mmol/L (ref 3.5–5.2)
Sodium: 141 mmol/L (ref 134–144)
Total Protein: 6.6 g/dL (ref 6.0–8.5)
eGFR: 87 mL/min/{1.73_m2} (ref >59)

## 2020-11-11 LAB — LIPID PANEL WITH LDL/HDL RATIO
Cholesterol, Total: 137 mg/dL (ref 100–199)
HDL: 47 mg/dL (ref >39)
LDL Chol Calc (NIH): 68 mg/dL (ref 0–99)
LDL/HDL Ratio: 1.4 ratio (ref 0.0–3.2)
Triglycerides: 121 mg/dL (ref 0–149)
VLDL Cholesterol Cal: 22 mg/dL (ref 5–40)

## 2020-11-11 LAB — TSH+FREE T4
Free T4: 1.34 ng/dL (ref 0.82–1.77)
TSH: 0.857 u[IU]/mL (ref 0.450–4.500)

## 2020-11-11 LAB — HEMOGLOBIN A1C
Est. average glucose Bld gHb Est-mCnc: 157 mg/dL
Hgb A1c MFr Bld: 7.1 % — ABNORMAL HIGH (ref 4.8–5.6)

## 2020-11-11 NOTE — Progress Notes (Signed)
    Chronic Care Management Pharmacy Assistant   Name: KIRRA VERGA  MRN: [id number] DOB: 05/19/1945  Reason for Encounter: CCM Care Plan   Medications: Outpatient Encounter Medications as of 11/11/2020  Medication Sig   aspirin EC 81 MG tablet Take 81 mg by mouth daily.   atorvastatin (LIPITOR) 10 MG tablet Take  1 tab po twice a week   azelastine (ASTELIN) 0.1 % nasal spray PLACE 1 SPRAY INTO EACH NOSTRIL DAILY ASDIRECTED.   Calcium Carb-Cholecalciferol 600-200 MG-UNIT TABS Take 1 tablet by mouth daily.   cetirizine (ZYRTEC) 10 MG tablet Take 10 mg by mouth daily.   esomeprazole (NEXIUM) 40 MG capsule Take 40 mg by mouth daily at 12 noon.   fluocinonide (LIDEX) 0.05 % external solution Apply 1 application topically 2 (two) times daily.   fluticasone (FLONASE) 50 MCG/ACT nasal spray Place 1 spray into both nostrils daily.   ketoconazole (NIZORAL) 2 % shampoo Apply 1 application topically 2 (two) times a week.   montelukast (SINGULAIR) 10 MG tablet TAKE (1) TABLET BY MOUTH DAILY AT BEDTIME   Multiple Vitamins-Minerals (MULTIVITAMIN ADULT PO) Take 1 tablet by mouth daily.   oxybutynin (DITROPAN) 5 MG tablet TAKE (1) TABLET BY MOUTH EVERY DAY   potassium citrate (UROCIT-K) 10 MEQ (1080 MG) SR tablet Take 1 tablet by mouth twice daily   Semaglutide (RYBELSUS) 7 MG TABS Take 7 mg by mouth daily.   No facility-administered encounter medications on file as of 11/11/2020.   Reviewed the patients initial visit reinsured it was completed per the pharmacist Leata Mouse request. Printed the CCM care plan. Mailed the patient CCM care plan to their most recent address on file.  Follow-Up:Pharmacist Review  Charlann Lange, [place] Pharmacist Assistant [phone]

## 2020-11-16 ENCOUNTER — Encounter: Payer: Self-pay | Admitting: Radiation Oncology

## 2020-11-16 ENCOUNTER — Ambulatory Visit
Admission: RE | Admit: 2020-11-16 | Discharge: 2020-11-16 | Disposition: A | Discharge status: Home / Self Care | Payer: Medicare HMO | Source: Ambulatory Visit | Attending: Radiation Oncology | Admitting: Radiation Oncology

## 2020-11-16 ENCOUNTER — Other Ambulatory Visit: Payer: Self-pay

## 2020-11-16 VITALS — BP 152/85 | HR 78 | Temp 97.0°F | Resp 18 | Wt 241.2 lb

## 2020-11-16 DIAGNOSIS — C541 Malignant neoplasm of endometrium: Secondary | ICD-10-CM | POA: Diagnosis not present

## 2020-11-16 DIAGNOSIS — Z90722 Acquired absence of ovaries, bilateral: Secondary | ICD-10-CM | POA: Insufficient documentation

## 2020-11-16 DIAGNOSIS — Z90710 Acquired absence of both cervix and uterus: Secondary | ICD-10-CM | POA: Insufficient documentation

## 2020-11-16 DIAGNOSIS — Z923 Personal history of irradiation: Secondary | ICD-10-CM | POA: Diagnosis not present

## 2020-11-16 DIAGNOSIS — Z08 Encounter for follow-up examination after completed treatment for malignant neoplasm: Secondary | ICD-10-CM | POA: Diagnosis not present

## 2020-11-16 NOTE — Progress Notes (Signed)
Radiation Oncology Follow up Note  Name: Cassidy Parsons   Date:   11/16/2020 MRN:  [id number] DOB: [birth year]    This 75 y.o. 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  She is status post TLH BS oh and was in clearly stage due to her morbid obesity and adhesive disease.  She underwent whole pelvic radiation therapy.  Clinically she is doing fairly well although on recent CT scan there was a soft tissue nodule in the right aspect of the aorta measuring up to 1 x 2.4 cm.  This is at the aspect of the aorta anterior to the right ureter which may represent enlarged lymph node or lymphocele.  No other evidence of progressive disease was noted.  She has been followed by Dr. Theora Gianotti with exam showing no evidence of disease.  She is scheduled for a follow-up CT scan to assess the 2.4 cm nodule in the periaortic region to see if there is progression.  Patient specifically denies any abdominal pain vaginal discharge or diarrhea..  COMPLICATIONS OF TREATMENT: none  FOLLOW UP COMPLIANCE: keeps appointments   PHYSICAL EXAM:  BP (!) 152/85 (BP Location: Right Arm, Patient Position: Sitting, Cuff Size: Normal)   Pulse 78   Temp (!) 97 F (36.1 C) (Tympanic)   Resp 18   Wt 241 lb 3.2 oz (109.4 kg)   SpO2 96%   BMI 47.11 kg/m  Well-developed well-nourished patient in NAD. HEENT reveals PERLA, EOMI, discs not visualized.  Oral cavity is clear. No oral mucosal lesions are identified. Neck is clear without evidence of cervical or supraclavicular adenopathy. Lungs are clear to A&P. Cardiac examination is essentially unremarkable with regular rate and rhythm without  murmur rub or thrill. Abdomen is benign with no organomegaly or masses noted. Motor sensory and DTR levels are equal and symmetric in the upper and lower extremities. Cranial nerves II through XII are grossly intact. Proprioception is intact. No peripheral adenopathy or edema is identified. No motor or sensory levels are noted. Crude visual fields are within normal range.  RADIOLOGY RESULTS: CT scans reviewed.  Compatible with above-stated findings  PLAN: At this time she will have a follow-up CT scan to his sternum whether there is progression of disease in this periaortic probable lymph node.  Should this be enlarging would suggest PET CT scan since biopsy near the aorta would be extremely difficult.  If this is PET positive may offer SBRT to this lesion.  I explained this to the patient.  Of asked to see her back after her next CT scan.  She continues close follow-up care with Dr. Theora Gianotti.  Patient knows to call with any concerns.  I would like to take this opportunity to thank you for allowing me to participate in the care of your patient.Noreene Filbert, MD

## 2020-11-18 ENCOUNTER — Other Ambulatory Visit: Payer: Self-pay

## 2020-11-18 MED ORDER — OXYBUTYNIN CHLORIDE 5 MG PO TABS: ORAL_TABLET | ORAL | 0 refills | Status: DC

## 2020-11-22 ENCOUNTER — Ambulatory Visit: Payer: Medicare HMO | Admitting: Nurse Practitioner

## 2020-11-23 ENCOUNTER — Encounter: Payer: Self-pay | Admitting: Nurse Practitioner

## 2020-11-23 ENCOUNTER — Ambulatory Visit (INDEPENDENT_AMBULATORY_CARE_PROVIDER_SITE_OTHER): Payer: Medicare HMO | Admitting: Nurse Practitioner

## 2020-11-23 ENCOUNTER — Other Ambulatory Visit: Payer: Self-pay

## 2020-11-23 VITALS — BP 139/76 | HR 76 | Temp 98.4°F | Resp 16 | Ht 61.0 in | Wt 241.0 lb

## 2020-11-23 DIAGNOSIS — E1165 Type 2 diabetes mellitus with hyperglycemia: Secondary | ICD-10-CM

## 2020-11-23 DIAGNOSIS — J4520 Mild intermittent asthma, uncomplicated: Secondary | ICD-10-CM | POA: Diagnosis not present

## 2020-11-23 DIAGNOSIS — Z0001 Encounter for general adult medical examination with abnormal findings: Secondary | ICD-10-CM | POA: Diagnosis not present

## 2020-11-23 DIAGNOSIS — I10 Essential (primary) hypertension: Secondary | ICD-10-CM

## 2020-11-23 DIAGNOSIS — R300 Dysuria: Secondary | ICD-10-CM | POA: Diagnosis not present

## 2020-11-23 NOTE — Progress Notes (Signed)
[facility] [place], [place] [address]  Internal MEDICINE  Office Visit Note  Patient Name: Cassidy Parsons  [id number]  [id number]  Date of Service: 11/23/2020  Chief Complaint  Patient presents with   Medicare Wellness   Diabetes   Hypertension    HPI Cassidy Parsons presents for an annual well visit and physical exam.  She is a well-appearing 75 year old female with diabetes.  She had her labs drawn prior to her office visit today and labs were discussed with her.  Her metabolic panel was normal except for an elevated fasting glucose.  Her CBC is grossly normal, and her thyroid levels are normal as well.  Her lipid panel is normal and her A1c is elevated at 7.1 as of November 3.  She was started on Rybelsus for elevated A1c on November 3.  She reports that she does not need any refills today of any medications.  She denies any pain.  She reports that she has been previously treated for endometrial cancer and she will be getting her full body scan again soon.  She had her mammogram in May this year and it was normal.  She is due for her diabetic foot exam today and she had her diabetic eye exam done in September this year.  She has no other questions or concerns today.     Current Medication: Outpatient Encounter Medications as of 11/23/2020  Medication Sig   aspirin EC 81 MG tablet Take 81 mg by mouth daily.   atorvastatin (LIPITOR) 10 MG tablet Take  1 tab po twice a week   azelastine (ASTELIN) 0.1 % nasal spray PLACE 1 SPRAY INTO EACH NOSTRIL DAILY ASDIRECTED.   Calcium Carb-Cholecalciferol 600-200 MG-UNIT TABS Take 1 tablet by mouth daily.   cetirizine (ZYRTEC) 10 MG tablet Take 10 mg by mouth daily.   esomeprazole (NEXIUM) 40 MG capsule Take 40 mg by mouth daily at 12 noon.   fluocinonide (LIDEX) 0.05 % external solution Apply 1 application topically 2 (two) times daily.   fluticasone (FLONASE) 50 MCG/ACT nasal spray Place 1 spray into both nostrils daily.    ketoconazole (NIZORAL) 2 % shampoo Apply 1 application topically 2 (two) times a week.   montelukast (SINGULAIR) 10 MG tablet TAKE (1) TABLET BY MOUTH DAILY AT BEDTIME   Multiple Vitamins-Minerals (MULTIVITAMIN ADULT PO) Take 1 tablet by mouth daily.   oxybutynin (DITROPAN) 5 MG tablet TAKE (1) TABLET BY MOUTH EVERY DAY   potassium citrate (UROCIT-K) 10 MEQ (1080 MG) SR tablet Take 1 tablet by mouth twice daily   Semaglutide (RYBELSUS) 7 MG TABS Take 7 mg by mouth daily.   No facility-administered encounter medications on file as of 11/23/2020.    Surgical History: Past Surgical History:  Procedure Laterality Date   BRAIN SURGERY     BREAST SURGERY     biopsy right breast   CRANIOTOMY N/A 02/10/2015   Procedure: Removal of cranial plate with scalp wound revision;  Surgeon: Earnie Larsson, MD;  Location: [facility];  Service: Neurosurgery;  Laterality: N/A;   DIAGNOSTIC LAPAROSCOPY     panniculectomy   PARTIAL HYSTERECTOMY     TONSILLECTOMY      Medical History: Past Medical History:  Diagnosis Date   Asthma    Brain tumor (benign) ([place])    right side   Diabetes mellitus without complication ([place])    borderline   Early cataracts, bilateral    Environmental and seasonal allergies    GERD (gastroesophageal reflux  disease)    Hypertension    PMH:   On home oxygen therapy    pt is no longer  using oxygen at night   Seizures ([place])    haven't had a seizure since brain surgery in 2001   Uterine cancer [facility])     Family History: Family History  Problem Relation Age of Onset   Cancer Mother    Diabetes Father    Heart failure Father    Depression Sister    Stroke Sister    Depression Sister    Cancer Brother    Cancer Brother    Cancer Brother    Diabetes Other     Social History   Socioeconomic History   Marital status: Widowed    Spouse name: Not on file   Number of children: Not on file   Years of education: Not on file   Highest education level: Not on file   Occupational History   Not on file  Tobacco Use   Smoking status: Former    Types: Cigarettes   Smokeless tobacco: Never   Tobacco comments:    quit smoking cigarettes 34 years ago  Vaping Use   Vaping Use: Never used  Substance and Sexual Activity   Alcohol use: No   Drug use: No   Sexual activity: Not on file  Other Topics Concern   Not on file  Social History Narrative   Not on file   Social Determinants of Health   Financial Resource Strain: Low Risk    Difficulty of Paying Living Expenses: Not very hard  Food Insecurity: Not on file  Transportation Needs: Not on file  Physical Activity: Not on file  Stress: Not on file  Social Connections: Not on file  Intimate Partner Violence: Not on file      Review of Systems  Constitutional:  Negative for activity change, appetite change, chills, fatigue, fever and unexpected weight change.  HENT: Negative.  Negative for congestion, ear pain, rhinorrhea, sore throat and trouble swallowing.   Eyes: Negative.   Respiratory: Negative.  Negative for cough, chest tightness, shortness of breath and wheezing.   Cardiovascular: Negative.  Negative for chest pain.  Gastrointestinal: Negative.  Negative for abdominal pain, blood in stool, constipation, diarrhea, nausea and vomiting.  Endocrine: Negative.   Genitourinary: Negative.  Negative for difficulty urinating, dysuria, frequency, hematuria and urgency.  Musculoskeletal: Negative.  Negative for arthralgias, back pain, joint swelling, myalgias and neck pain.  Skin: Negative.  Negative for rash and wound.  Allergic/Immunologic: Negative.  Negative for immunocompromised state.  Neurological: Negative.  Negative for dizziness, seizures, numbness and headaches.  Hematological: Negative.   Psychiatric/Behavioral: Negative.  Negative for behavioral problems, self-injury and suicidal ideas. The patient is not nervous/anxious.    Vital Signs: BP 139/76   Pulse 76   Temp 98.4 F (36.9  C)   Resp 16   Ht 5\' 1"  (1.549 m)   Wt 241 lb (109.3 kg)   SpO2 98%   BMI 45.54 kg/m    Physical Exam Vitals reviewed.  Constitutional:      General: She is awake. She is not in acute distress.    Appearance: Normal appearance. She is well-developed and well-groomed. She is obese. She is not ill-appearing or diaphoretic.  HENT:     Head: Normocephalic and atraumatic.     Right Ear: Tympanic membrane, ear canal and external ear normal.     Left Ear: Tympanic membrane, ear canal and external ear normal.  Nose: Nose normal. No congestion or rhinorrhea.     Mouth/Throat:     Lips: Pink.     Mouth: Mucous membranes are moist.     Pharynx: Oropharynx is clear. Uvula midline. No oropharyngeal exudate or posterior oropharyngeal erythema.  Eyes:     General: Lids are normal. Vision grossly intact. Gaze aligned appropriately. No scleral icterus.       Right eye: No discharge.        Left eye: No discharge.     Extraocular Movements: Extraocular movements intact.     Conjunctiva/sclera: Conjunctivae normal.     Pupils: Pupils are equal, round, and reactive to light.     Funduscopic exam:    Right eye: Red reflex present.        Left eye: Red reflex present. Neck:     Thyroid: No thyromegaly.     Vascular: No JVD.     Trachea: Trachea normal. No tracheal deviation.  Cardiovascular:     Rate and Rhythm: Normal rate and regular rhythm.     Pulses: Normal pulses.     Heart sounds: Normal heart sounds, S1 normal and S2 normal. No murmur heard.   No friction rub. No gallop.  Pulmonary:     Effort: Pulmonary effort is normal. No accessory muscle usage or respiratory distress.     Breath sounds: Normal breath sounds and air entry. No stridor. No wheezing or rales.  Chest:     Chest wall: No tenderness.     Comments: Declined clinical breast exam, gets annual mammograms.  Abdominal:     General: Bowel sounds are normal. There is no distension.     Palpations: Abdomen is soft. There  is no shifting dullness, fluid wave, mass or pulsatile mass.     Tenderness: There is no abdominal tenderness. There is no guarding or rebound.  Musculoskeletal:        General: No tenderness or deformity. Normal range of motion.     Cervical back: Normal range of motion and neck supple.  Lymphadenopathy:     Cervical: No cervical adenopathy.  Skin:    General: Skin is warm and dry.     Capillary Refill: Capillary refill takes less than 2 seconds.     Coloration: Skin is not pale.     Findings: No erythema or rash.  Neurological:     Mental Status: She is alert and oriented to person, place, and time.     Cranial Nerves: No cranial nerve deficit.     Motor: No abnormal muscle tone.     Coordination: Coordination normal.     Deep Tendon Reflexes: Reflexes are normal and symmetric.  Psychiatric:        Mood and Affect: Mood and affect normal.        Behavior: Behavior normal. Behavior is cooperative.        Thought Content: Thought content normal.        Judgment: Judgment normal.       Assessment/Plan: 1. Encounter for general adult medical examination with abnormal findings Age-appropriate preventive screenings and vaccinations discussed, annual physical exam completed. Routine labs for health maintenance drawn previous and discussed with patient during today's office visit. PHM updated.    2. Type 2 diabetes mellitus with hyperglycemia, without long-term current use of insulin (HCC) A1C is 7.1, repeat in February. Currently taking rybelsus  3. Mild intermittent asthma without complication No taking any maintenance inhalers. On montelukast.  4. Essential hypertension Blood pressure is stable,  not taking any medications for BP currently.   5. Dysuria Routine urinalysis done - UA/M w/rflx Culture, Routine      General Counseling: Cassidy Parsons verbalizes understanding of the findings of todays visit and agrees with plan of treatment. I have discussed any further diagnostic  evaluation that may be needed or ordered today. We also reviewed her medications today. she has been encouraged to call the office with any questions or concerns that should arise related to todays visit.    Orders Placed This Encounter  Procedures   Microscopic Examination   Urine Culture, Reflex   UA/M w/rflx Culture, Routine    No orders of the defined types were placed in this encounter.   Return in about 2 months (around 01/23/2021) for F/U, Recheck A1C, Cassidy Parsons PCP.   Total time spent:30 Minutes Time spent includes review of chart, medications, test results, and follow up plan with the patient.   [place] Controlled Substance Database was reviewed by me.  This patient was seen by Jonetta Osgood, FNP-C in collaboration with Dr. Clayborn Bigness as a part of collaborative care agreement.  Edee Nifong R. Valetta Fuller, MSN, FNP-C Internal medicine

## 2020-11-28 LAB — MICROSCOPIC EXAMINATION
Bacteria, UA: NONE SEEN
Casts: NONE SEEN /lpf

## 2020-11-28 LAB — UA/M W/RFLX CULTURE, ROUTINE
Bilirubin, UA: NEGATIVE
Glucose, UA: NEGATIVE
Nitrite, UA: NEGATIVE
RBC, UA: NEGATIVE
Specific Gravity, UA: 1.028 (ref 1.005–1.030)
Urobilinogen, Ur: 1 mg/dL (ref 0.2–1.0)
pH, UA: 8 — ABNORMAL HIGH (ref 5.0–7.5)

## 2020-11-28 LAB — URINE CULTURE, REFLEX

## 2020-12-07 DIAGNOSIS — E1165 Type 2 diabetes mellitus with hyperglycemia: Secondary | ICD-10-CM | POA: Diagnosis not present

## 2020-12-14 ENCOUNTER — Telehealth: Payer: Self-pay

## 2020-12-14 ENCOUNTER — Ambulatory Visit: Payer: Self-pay | Admitting: Student-PharmD

## 2020-12-14 DIAGNOSIS — E1165 Type 2 diabetes mellitus with hyperglycemia: Secondary | ICD-10-CM

## 2020-12-14 NOTE — Telephone Encounter (Signed)
Spoke to pt about the pt asst program needing proof of income instead of her bank statement and pt advised she had talked to the CCM person here at [place] and pt is going to wait to Jan to send in her application.  Pt will bring all the W-2 forms she receives in the mail and will bring me a copy

## 2020-12-14 NOTE — Progress Notes (Signed)
General Review Call  [facility]  [id number] 87 years, Female  DOB: 1945/11/07  M: [phone]  General Review  Completed by Charlann Lange on 12/14/2020  -Chart Review What recent interventions/DTPs have been made by any provider to improve the patient's conditions in the last 3 months?:  11/23/20 Jonetta Osgood, NP. For Dysuria. No medication changes. Any recent hospitalizations or ED visits since last visit with CPP?: No  Adherence Review Adherence rates for STAR metric medications: Atorvastatin 10 mg 98 DS 10/24/20 Adherence rates for medications indicated for disease state being reviewed: N/A. Does the patient have >5 day gap between last estimated fill dates for any of the above medications?: No  Disease State Questions Able to connect with the Patient?: Yes Did patient have any problems with their health recently?: No Did patient have any problems with their pharmacy?: No Does patient have any issues or side effects with their medications?: No Additional  information to pass to Patient's CPP?: No Anything we can do to help take better care of Patient?: Yes Note Patient's response: Patient needs me to call [place] about a PAP.  Misc. Response/Information:: Patient did get her labs completed on 11/23/20.   Pharmacist Review Adherence gaps identified?: No Drug Therapy Problems identified?: No Assessment: Controlled  15 minutes spent in review, coordination, and documentation.  Reviewed by: Alena Bills, PharmD Clinical Pharmacist [phone]

## 2020-12-21 ENCOUNTER — Encounter: Payer: Self-pay | Admitting: Nurse Practitioner

## 2020-12-26 ENCOUNTER — Ambulatory Visit
Admission: RE | Admit: 2020-12-26 | Discharge: 2020-12-26 | Disposition: A | Discharge status: Home / Self Care | Payer: Medicare HMO | Source: Ambulatory Visit | Attending: Nurse Practitioner | Admitting: Nurse Practitioner

## 2020-12-26 ENCOUNTER — Other Ambulatory Visit: Payer: Self-pay

## 2020-12-26 DIAGNOSIS — C541 Malignant neoplasm of endometrium: Secondary | ICD-10-CM

## 2020-12-26 DIAGNOSIS — C55 Malignant neoplasm of uterus, part unspecified: Secondary | ICD-10-CM | POA: Diagnosis not present

## 2020-12-26 DIAGNOSIS — I868 Varicose veins of other specified sites: Secondary | ICD-10-CM | POA: Diagnosis not present

## 2020-12-26 DIAGNOSIS — K8020 Calculus of gallbladder without cholecystitis without obstruction: Secondary | ICD-10-CM | POA: Diagnosis not present

## 2020-12-26 DIAGNOSIS — N281 Cyst of kidney, acquired: Secondary | ICD-10-CM | POA: Diagnosis not present

## 2020-12-26 LAB — POCT I-STAT CREATININE: Creatinine, Ser: 0.8 mg/dL (ref 0.44–1.00)

## 2020-12-26 MED ORDER — IOHEXOL 350 MG/ML SOLN
100.0000 mL | Freq: Once | INTRAVENOUS | Status: AC | PRN
  Administered 2020-12-26: 09:00:00 100 mL via INTRAVENOUS

## 2020-12-28 ENCOUNTER — Inpatient Hospital Stay: Payer: Medicare HMO | Attending: Obstetrics and Gynecology | Admitting: Obstetrics and Gynecology

## 2020-12-28 ENCOUNTER — Other Ambulatory Visit: Payer: Self-pay

## 2020-12-28 VITALS — BP 158/86 | HR 83 | Temp 98.3°F | Resp 20 | Wt 242.0 lb

## 2020-12-28 DIAGNOSIS — C541 Malignant neoplasm of endometrium: Secondary | ICD-10-CM | POA: Diagnosis not present

## 2020-12-28 DIAGNOSIS — Z87891 Personal history of nicotine dependence: Secondary | ICD-10-CM | POA: Insufficient documentation

## 2020-12-28 DIAGNOSIS — K8020 Calculus of gallbladder without cholecystitis without obstruction: Secondary | ICD-10-CM | POA: Diagnosis not present

## 2020-12-28 DIAGNOSIS — Z90710 Acquired absence of both cervix and uterus: Secondary | ICD-10-CM | POA: Insufficient documentation

## 2020-12-28 DIAGNOSIS — E119 Type 2 diabetes mellitus without complications: Secondary | ICD-10-CM | POA: Insufficient documentation

## 2020-12-28 DIAGNOSIS — R599 Enlarged lymph nodes, unspecified: Secondary | ICD-10-CM

## 2020-12-28 DIAGNOSIS — F418 Other specified anxiety disorders: Secondary | ICD-10-CM | POA: Insufficient documentation

## 2020-12-28 DIAGNOSIS — R69 Illness, unspecified: Secondary | ICD-10-CM | POA: Diagnosis not present

## 2020-12-28 DIAGNOSIS — I700 Atherosclerosis of aorta: Secondary | ICD-10-CM | POA: Insufficient documentation

## 2020-12-28 DIAGNOSIS — I10 Essential (primary) hypertension: Secondary | ICD-10-CM | POA: Diagnosis not present

## 2020-12-28 NOTE — Progress Notes (Signed)
Gynecologic Oncology Interval Visit   Referring Provider: Dr. Kenton Kingfisher  Chief Complaint: 1A mixed clear cell/endometrioid endometrial carcinoma the endometrium status post TLH/BSO, radiation  Subjective:  Cassidy Parsons is a 75 y.o. 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 Initial pathology from endometrial biopsy was reviewed at [facility] and confirmed clear cell carcinoma. She returns to clinic for repeat exam and discussion of her CT scan.   She has not major complaints and feels well generally.  Just nervous about the enlarging lymph node.  She does have chronic back pain and was diagnosed with DJD.    12/26/20 CT ABDOMEN AND PELVIS WITH CONTRAST FINDINGS: Lower chest: Coronary calcifications. No pleural or pericardial effusion. Punctate calcified subpleural granuloma in the posterior basal segment left lower lobe.   Hepatobiliary: Innumerable subcentimeter partially calcified stones layer in the nondilated gallbladder. No liver lesion or biliary ductal dilatation.   Pancreas: Unremarkable. No pancreatic ductal dilatation or surrounding inflammatory changes.   Spleen: Normal in size without focal abnormality.   Adrenals/Urinary Tract: Normal adrenal glands. Parapelvic renal cysts. No urolithiasis or hydronephrosis. Urinary bladder incompletely distended.   Stomach/Bowel: Stomach is physiologically distended, unremarkable. The small bowel is nondistended. Appendix not identified. No pericecal inflammatory changes. The colon is nondilated, unremarkable.   Vascular/Lymphatic: No significant arterial findings are present. Enlarged visualized portions of great saphenous veins with multiple associated distended anterior pelvic wall veins. 2.6 x 1.2 cm enlarged right retroperitoneal nodule or lymph node anterior to  the mid ureter (5:59 and 2:51), previously 2.4 x 1. No new adenopathy.   Reproductive: Status post hysterectomy. No adnexal masses.   Other: No ascites.  No free air.   Musculoskeletal: Multilevel spondylitic changes in the thoracolumbar spine. Osteitis pubis. Bilateral sacroiliitis. No acute fracture or worrisome bone lesion.   IMPRESSION: 1. Enlarging right retroperitoneal lymph node suggesting metastatic disease given the clinical history. This could be approached percutaneously for biopsy if clinically relevant. 2. Cholelithiasis   09/19/2020 CT ABDOMEN AND PELVIS WITH CONTRAST  Vascular/Lymphatic: The aorta is normal in caliber, and the central aspect of its branch vessels are patent. Soft tissue nodule at the right aspect of the aorta, anterior to the right ureter, measuring up to 10 x 8 x 24 mm (series 2, image 49 and series 6, image 118), which does not fill with contrast on the delayed sequence.   IMPRESSION: 1. Soft tissue nodule at the right aspect of the aorta, anterior to the right ureter, which may represent an enlarged lymph node or lymphocele. Attention on follow-up. 2. Status post hysterectomy with no definite adnexal lesions seen.  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  Patient had ~ 5 year history of PMB, worse since May 2021 after she fell on her stomach. On 18-Aug-2022 underwent endometrial biopsy. - Clear cell carcinoma, FIGO grade 3  Pap 2019/08/18- NILM  Husband died in 2022-04-07 and she delayed self care because she was taking care of him.   CT scan C/A/P 08/07/19 did not show metastatic disease. Impression:  1. Heterogeneous and thickened appearance of the endometrium in keeping with patients known primary malignancy.  2. No definite evidence of metastatic disease in the chest, abdomen, or pelvis.  3. Bilateral pulmonary nodules,  nonspecific. Recommend continued attention on  follow-up.  4. Mild thickening of the sigmoid colon and tethering of the superior bladder wall, favored sequela of prior diverticulitis.   Underwent TLH, BSx/RO SLN mapping at [facility] 08/11/19.  Grade 2 endometrioid 4.2 cm cancer with 26% invasion.  Negative right adnexa and cervix and no LVSI.  Left adnexa could not be removed due to dense adhesions of sigmoid colon secondary to severe diverticular disease.   Comment: The tumor demonstrates foci of well-formed endometrioid glands with defined nuclear polarity, but a considerable portion of the tumor appears solid, with large areas of squamous differentiation.  Morphologic features of clear cell carcinoma, such as marked nuclear atypia, hobnail architecture, hyalinized stroma, and intracytoplasmic eosinophilic globules, are not identified.  Secretory change is present in some cells, which stain positive for the markers Napsin A and AMACR.  The reported history of a prior biopsy with clear cell carcinoma is noted.  Review of this biopsy may be beneficial to exclude the possibility of a mixed clear cell/endometrioid tumor, if this diagnosis would influence patient management.  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   Slides were reviewed at [facility]. Dr. Fransisca Connors recommended adjuvant WPRT.   10/20/19-11/23/19 completed WPRT. Suffered diarrhea and fatigue during treatment and she has a h/o urinary frequency unrelieved by oxybutynin.   History of pulmonary nodules:  Previous imaging showed bilateral pulmonary nodules, 2-3 mm nonspecific on CT scan. Per patient, results were reassuring.  [facility] radiology regarding read on 07/18/20 CT scan. No further follow up needed given stability.  CT Chest 07/2020  IMPRESSION: 1. Pulmonary nodules are unchanged compared to the outside CT of 08/07/2019 and are considered benign. 2.  No acute process or evidence of metastatic disease in the  chest. 3. Coronary artery atherosclerosis. Aortic Atherosclerosis (ICD10-I70.0). 4. Cholelithiasis.  Problem List: Patient Active Problem List   Diagnosis Date Noted   Primary clear cell adenocarcinoma of endometrium ([place]) 12/23/2019   Morbid obesity with BMI of 45.0-49.9, adult ([place]) 08/12/2019   Urinary tract infection without hematuria 08/07/2018   Dysuria 08/07/2018   Osteoarthritis of knee 04/11/2017   Low back pain 02/05/2017   Mild persistent asthma, uncomplicated 61/60/7371   Rhinitis, allergic 02/05/2017   GAD (generalized anxiety disorder) 02/05/2017   Unspecified complication of internal orthopedic prosthetic device, implant and graft, initial encounter ([place]) 02/05/2017   Diabetes mellitus without complication ([place]) 07/04/9483   Gout, unspecified 02/05/2017   Occlusion and stenosis of bilateral carotid arteries 02/05/2017   Insomnia, unspecified 02/05/2017   Sleep apnea 02/05/2017   Elevated blood-pressure reading without diagnosis of hypertension 02/05/2017   Dupuytren's disease of palm 10/05/2016   Exposed orthopaedic hardware ([place]) 02/10/2015    Past Medical History: Past Medical History:  Diagnosis Date   Asthma    Brain tumor (benign) ([place])    right side   Diabetes mellitus without complication ([place])    borderline   Early cataracts, bilateral    Environmental and seasonal allergies    GERD (gastroesophageal reflux disease)    Hypertension    PMH:   On home oxygen therapy    pt is no longer  using oxygen at night   Seizures ([place])    haven't had a seizure since brain surgery in 2001   Uterine cancer [facility])     Past Surgical History: Past Surgical History:  Procedure Laterality Date   BRAIN SURGERY     BREAST SURGERY     biopsy right breast   CRANIOTOMY N/A 02/10/2015  Procedure: Removal of cranial plate with scalp wound revision;  Surgeon: Earnie Larsson, MD;  Location: [place] [facility];  Service: Neurosurgery;  Laterality: N/A;   DIAGNOSTIC LAPAROSCOPY      panniculectomy   PARTIAL HYSTERECTOMY     TONSILLECTOMY      Past Gynecologic History:  Menarche: age [age] Post menopausal Post menopausal bleeding    OB History:  OB History  Gravida Para Term Preterm AB Living  _0 SAB IAB Ectopic Multiple Live Births               # Outcome Date GA Lbr Len/2nd Weight Sex Delivery Anes PTL Lv  2 AB           1 Term             Family History: Family History  Problem Relation Age of Onset   Cancer Mother    Diabetes Father    Heart failure Father    Depression Sister    Stroke Sister    Depression Sister    Cancer Brother    Cancer Brother    Cancer Brother    Diabetes Other     Social History: Social History   Socioeconomic History   Marital status: Widowed    Spouse name: Not on file   Number of children: Not on file   Years of education: Not on file   Highest education level: Not on file  Occupational History   Not on file  Tobacco Use   Smoking status: Former    Types: Cigarettes   Smokeless tobacco: Never   Tobacco comments:    quit smoking cigarettes 34 years ago  Vaping Use   Vaping Use: Never used  Substance and Sexual Activity   Alcohol use: No   Drug use: No   Sexual activity: Not on file  Other Topics Concern   Not on file  Social History Narrative   Not on file   Social Determinants of Health   Financial Resource Strain: Low Risk    Difficulty of Paying Living Expenses: Not very hard  Food Insecurity: Not on file  Transportation Needs: Not on file  Physical Activity: Not on file  Stress: Not on file  Social Connections: Not on file  Intimate Partner Violence: Not on file   Immunization History  Administered Date(s) Administered   Influenza-Unspecified 10/22/2016, 09/21/2019, 10/10/2020   Moderna SARS-COV2 Booster Vaccination 12/15/2019, 11/04/2020   Moderna Sars-Covid-2 Vaccination 03/02/2019, 03/31/2019, 06/28/2020   Pneumococcal Conjugate-13 07/23/2015   Pneumococcal  Polysaccharide-23 02/18/2018   Zoster, Live 10/16/2012   Zoster, Unspecified 10/16/2012   Allergies: Allergies  Allergen Reactions   Dust Mite Extract    Mold Extract  [Trichophyton]    Molds & Smuts Other (See Comments)   Pollen Extract    Roxanol [Morphine] Other (See Comments)    Extreme pain   Meperidine Rash    Itching   Meperidine And Related Itching and Rash   Other Itching, Rash and Other (See Comments)    All pain relievers except hydocodone Trees, mold, dust, and perfume allergies (sneezing, eyes swell up)   Propoxyphene Itching and Rash   Tape Itching and Rash    With paper tape.  Please use adhesive tape Rash   Tramadol Itching and Rash    Current Medications: Current Outpatient Medications  Medication Sig Dispense Refill   aspirin EC 81 MG tablet Take 81 mg by mouth daily.  atorvastatin (LIPITOR) 10 MG tablet Take  1 tab po twice a week 90 tablet 1   azelastine (ASTELIN) 0.1 % nasal spray PLACE 1 SPRAY INTO EACH NOSTRIL DAILY ASDIRECTED. 30 mL 3   Calcium Carb-Cholecalciferol 600-200 MG-UNIT TABS Take 1 tablet by mouth daily.     cetirizine (ZYRTEC) 10 MG tablet Take 10 mg by mouth daily.     esomeprazole (NEXIUM) 40 MG capsule Take 40 mg by mouth daily at 12 noon.     fluocinonide (LIDEX) 0.05 % external solution Apply 1 application topically 2 (two) times daily. 60 mL 1   fluticasone (FLONASE) 50 MCG/ACT nasal spray Place 1 spray into both nostrils daily.     ketoconazole (NIZORAL) 2 % shampoo Apply 1 application topically 2 (two) times a week. 120 mL 1   montelukast (SINGULAIR) 10 MG tablet TAKE (1) TABLET BY MOUTH DAILY AT BEDTIME 90 tablet 1   Multiple Vitamins-Minerals (MULTIVITAMIN ADULT PO) Take 1 tablet by mouth daily.     oxybutynin (DITROPAN) 5 MG tablet TAKE (1) TABLET BY MOUTH EVERY DAY 90 tablet 0   potassium citrate (UROCIT-K) 10 MEQ (1080 MG) SR tablet Take 1 tablet by mouth twice daily 180 tablet 1   Semaglutide (RYBELSUS) 7 MG TABS Take 7 mg  by mouth daily. 90 tablet 1   No current facility-administered medications for this visit.   Review of Systems General:  no complaints Skin: no complaints Eyes: no complaints HEENT: no complaints Breasts: no complaints Pulmonary: no complaints Cardiac: no complaints Gastrointestinal: no complaints Genitourinary/Sexual: no complaints Ob/Gyn: no complaints Hematology: no complaints Neurologic/Psych: depression & anxiety   Objective:  Physical Examination:  BP (!) 158/86    Pulse 83    Temp 98.3 F (36.8 C)    Resp 20    Wt 242 lb (109.8 kg)    SpO2 100%    BMI 45.73 kg/m     ECOG Performance Status: 1 - Symptomatic but completely ambulatory  GENERAL: Patient is a well appearing female in no acute distress. Unaccompanied.  HEENT:  Sclera clear. Anicteric NODES:  Negative axillary, supraclavicular, inguinal lymph node survery LUNGS:  Clear to auscultation bilaterally.   HEART:  Regular rate and rhythm.  ABDOMEN:  Soft, nontender.  No hernias, incisions well healed. No masses or ascites EXTREMITIES:  No peripheral edema. Atraumatic. No cyanosis SKIN:  Clear with no obvious rashes or skin changes.  NEURO:  Nonfocal. Well oriented.  Appropriate affect.   Pelvic: chaparoned by CMA EGBUS: no lesions Cervix: surgically absent  Vagina: no lesions, no discharge or bleeding. Mild agglutination.  Uterus: surgically absent  Adnexa: On palpation thickening of the vaginal cuff in the midline. But no definite mass Rectovaginal: confirms     Assessment:  Cassidy Parsons is a 75 y.o. female diagnosed with 1A mixed clear cell/endometrioid endometrial carcinoma (MSI; dMMR with loss of MLH1/PMS2 with methylated MLH1 alleles present) s/p TLH, RSO 08/11/19. SLN mapping unsuccessful and left adnexa could not be removed due to severe adhesions from diverticular disease.  No LVSI, or cervical/adnexal involvement and invades 23% of the myometrium. Pre-op CT scan negative for metastatic disease.  Treated with whole pelvic radiation 11/21.  On exam earlier this year concern for possible vaginal cuff mass, but this could represent left adnexa. Prompted CT scan 9/22 that was negative other than 2.4 cm nodule most likely lymph node.  This looks to be above the pelvic RT field on the right near the IVC.  Now CT scan  in 12/22 shows slight increase in the node to 2.6 cm  and it is amenable to percutaneous biopsy.  Pulmonary nodules, 07/2019 on CT chest, on follow up CT at [place] 2022 report stable. No additional imaging was recommended.   Depression and anxiety  Medical co-morbidities complicating care: HTN, morbid obesity (There is no height or weight on file to calculate BMI.), prediabetes, possible h/o carotid stenosis.  Plan:   Problem List Items Addressed This Visit       Genitourinary   Primary clear cell adenocarcinoma of endometrium ([place]) - Primary   I discussed options to follow up the enlarging nodule noted on CT scan. I suspect this is a right aortic lymph node and it is a bit larger.  She is worried about it and in agreement with IR guided biopsy and we will schedule this. She understands this may represent recurrence. Normal exam today.  If the node is positive believe it probably could be radiated, as it is likely above the prior radiation field. She would also be a candidate for chemotherapy or immunotherapy with pembrolizumab.    Mellody Drown, MD

## 2020-12-29 ENCOUNTER — Telehealth: Payer: Self-pay

## 2020-12-29 NOTE — Telephone Encounter (Signed)
Cassidy Parsons returned call. She has received biopsy information and does not have any further questions at this time.

## 2020-12-29 NOTE — Telephone Encounter (Signed)
Called and left voicemail with Cassidy Parsons for biopsy date. Scheduled 12/29 at 1030, arrival time of 0930. She will need a driver and remain NPO for 6 hours prior. A nurse will be contacting her on Tuesday or Wednesday with further instructions. I have asked to call me back when she receives my message.

## 2021-01-05 ENCOUNTER — Other Ambulatory Visit: Payer: Self-pay | Admitting: Radiology

## 2021-01-05 ENCOUNTER — Other Ambulatory Visit: Payer: Self-pay

## 2021-01-05 ENCOUNTER — Ambulatory Visit
Admission: RE | Admit: 2021-01-05 | Discharge: 2021-01-05 | Disposition: A | Discharge status: Home / Self Care | Payer: Medicare HMO | Source: Ambulatory Visit | Attending: Obstetrics and Gynecology | Admitting: Obstetrics and Gynecology

## 2021-01-05 DIAGNOSIS — Z87891 Personal history of nicotine dependence: Secondary | ICD-10-CM | POA: Insufficient documentation

## 2021-01-05 DIAGNOSIS — R599 Enlarged lymph nodes, unspecified: Secondary | ICD-10-CM | POA: Insufficient documentation

## 2021-01-05 DIAGNOSIS — K8020 Calculus of gallbladder without cholecystitis without obstruction: Secondary | ICD-10-CM | POA: Insufficient documentation

## 2021-01-05 DIAGNOSIS — J45909 Unspecified asthma, uncomplicated: Secondary | ICD-10-CM | POA: Diagnosis not present

## 2021-01-05 DIAGNOSIS — K219 Gastro-esophageal reflux disease without esophagitis: Secondary | ICD-10-CM | POA: Insufficient documentation

## 2021-01-05 DIAGNOSIS — R590 Localized enlarged lymph nodes: Secondary | ICD-10-CM | POA: Diagnosis not present

## 2021-01-05 DIAGNOSIS — M1990 Unspecified osteoarthritis, unspecified site: Secondary | ICD-10-CM | POA: Diagnosis not present

## 2021-01-05 DIAGNOSIS — C541 Malignant neoplasm of endometrium: Secondary | ICD-10-CM | POA: Diagnosis not present

## 2021-01-05 DIAGNOSIS — E1136 Type 2 diabetes mellitus with diabetic cataract: Secondary | ICD-10-CM | POA: Insufficient documentation

## 2021-01-05 DIAGNOSIS — I10 Essential (primary) hypertension: Secondary | ICD-10-CM | POA: Diagnosis not present

## 2021-01-05 DIAGNOSIS — R569 Unspecified convulsions: Secondary | ICD-10-CM | POA: Insufficient documentation

## 2021-01-05 LAB — BASIC METABOLIC PANEL
Anion gap: 10 (ref 5–15)
BUN: 16 mg/dL (ref 8–23)
CO2: 25 mmol/L (ref 22–32)
Calcium: 9.1 mg/dL (ref 8.9–10.3)
Chloride: 100 mmol/L (ref 98–111)
Creatinine, Ser: 0.67 mg/dL (ref 0.44–1.00)
GFR, Estimated: >60 mL/min (ref >60)
Glucose, Bld: 141 mg/dL — ABNORMAL HIGH (ref 70–99)
Potassium: 4.4 mmol/L (ref 3.5–5.1)
Sodium: 135 mmol/L (ref 135–145)

## 2021-01-05 LAB — CBC WITH DIFFERENTIAL/PLATELET
Abs Immature Granulocytes: 0.02 10*3/uL (ref 0.00–0.07)
Basophils Absolute: 0 10*3/uL (ref 0.0–0.1)
Basophils Relative: 1 %
Eosinophils Absolute: 0.1 10*3/uL (ref 0.0–0.5)
Eosinophils Relative: 3 %
HCT: 44.1 % (ref 36.0–46.0)
Hemoglobin: 14.7 g/dL (ref 12.0–15.0)
Immature Granulocytes: 0 %
Lymphocytes Relative: 22 %
Lymphs Abs: 1 10*3/uL (ref 0.7–4.0)
MCH: 29.9 pg (ref 26.0–34.0)
MCHC: 33.3 g/dL (ref 30.0–36.0)
MCV: 89.6 fL (ref 80.0–100.0)
Monocytes Absolute: 0.4 10*3/uL (ref 0.1–1.0)
Monocytes Relative: 10 %
Neutro Abs: 3 10*3/uL (ref 1.7–7.7)
Neutrophils Relative %: 64 %
Platelets: 270 10*3/uL (ref 150–400)
RBC: 4.92 MIL/uL (ref 3.87–5.11)
RDW: 14.6 % (ref 11.5–15.5)
WBC: 4.6 10*3/uL (ref 4.0–10.5)
nRBC: 0 % (ref 0.0–0.2)

## 2021-01-05 LAB — GLUCOSE, CAPILLARY: Glucose-Capillary: 141 mg/dL — ABNORMAL HIGH (ref 70–99)

## 2021-01-05 LAB — CBC
HCT: 42.9 % (ref 36.0–46.0)
Hemoglobin: 14.1 g/dL (ref 12.0–15.0)
MCH: 29.4 pg (ref 26.0–34.0)
MCHC: 32.9 g/dL (ref 30.0–36.0)
MCV: 89.6 fL (ref 80.0–100.0)
Platelets: 257 10*3/uL (ref 150–400)
RBC: 4.79 MIL/uL (ref 3.87–5.11)
RDW: 14.4 % (ref 11.5–15.5)
WBC: 5.4 10*3/uL (ref 4.0–10.5)
nRBC: 0 % (ref 0.0–0.2)

## 2021-01-05 LAB — PROTIME-INR
INR: 1 (ref 0.8–1.2)
Prothrombin Time: 13.5 seconds (ref 11.4–15.2)

## 2021-01-05 MED ORDER — FENTANYL CITRATE (PF) 100 MCG/2ML IJ SOLN
INTRAMUSCULAR | Status: AC | PRN
  Administered 2021-01-05: 25 ug via INTRAVENOUS

## 2021-01-05 MED ORDER — MIDAZOLAM HCL 2 MG/2ML IJ SOLN
INTRAMUSCULAR | Status: AC
  Filled 2021-01-05: qty 4

## 2021-01-05 MED ORDER — MIDAZOLAM HCL 2 MG/2ML IJ SOLN
INTRAMUSCULAR | Status: AC | PRN
  Administered 2021-01-05: 1 mg via INTRAVENOUS

## 2021-01-05 MED ORDER — FENTANYL CITRATE (PF) 100 MCG/2ML IJ SOLN
INTRAMUSCULAR | Status: AC
  Filled 2021-01-05: qty 2

## 2021-01-05 MED ORDER — SODIUM CHLORIDE 0.9 % IV SOLN: INTRAVENOUS | Status: DC

## 2021-01-05 MED ORDER — IOHEXOL 300 MG/ML  SOLN
100.0000 mL | Freq: Once | INTRAMUSCULAR | Status: AC | PRN
  Administered 2021-01-05: 13:00:00 100 mL via INTRAVENOUS

## 2021-01-05 NOTE — Progress Notes (Signed)
Patient ID: Cassidy Parsons, female   DOB: August 11, 1945, 75 y.o.   MRN: [id number] Pt currently without major complaints except some mild back pain which is chronic and for her positional related.  Vital signs are stable.  Follow-up CBC normal.  Patient also seen by Dr. Denna Haggard. We will plan to discharge patient today.  Discharge instructions reviewed with patient.

## 2021-01-05 NOTE — Consult Note (Addendum)
Chief Complaint: Patient was seen in consultation today for  image guided right retroperitoneal lymph node biopsy  Referring Physician(s): Gilman  Supervising Physician: Juliet Rude  Patient Status: [facility] - Out-pt  History of Present Illness: Cassidy Parsons is a 75 y.o. female with past medical history significant for asthma, benign brain tumor, diabetes, GERD, DJD, hypertension, seizures, and mixed clear-cell/endometrioid endometrial carcinoma ,status post TLH/BSO on 08/11/19 at [facility] as well as radiation therapy.  CT abdomen pelvis on 12/26/2020 revealed:  1. Enlarging right retroperitoneal lymph node suggesting metastatic disease given the clinical history. This could be approached percutaneously for biopsy if clinically relevant. 2. Cholelithiasis  She presents today for image guided biopsy of the enlarged right retroperitoneal lymph node.  Past Medical History:  Diagnosis Date   Asthma    Brain tumor (benign) ([place])    right side   Diabetes mellitus without complication ([place])    borderline   Early cataracts, bilateral    Environmental and seasonal allergies    GERD (gastroesophageal reflux disease)    Hypertension    PMH:   On home oxygen therapy    pt is no longer  using oxygen at night   Seizures ([place])    haven't had a seizure since brain surgery in 2001   Uterine cancer [facility])     Past Surgical History:  Procedure Laterality Date   BRAIN SURGERY     BREAST SURGERY     biopsy right breast   CRANIOTOMY N/A 02/10/2015   Procedure: Removal of cranial plate with scalp wound revision;  Surgeon: Earnie Larsson, MD;  Location: [facility] NEURO ORS;  Service: Neurosurgery;  Laterality: N/A;   DIAGNOSTIC LAPAROSCOPY     panniculectomy   PARTIAL HYSTERECTOMY     TONSILLECTOMY      Allergies: Dust mite extract, Mold extract  [trichophyton], Molds & smuts, Pollen extract, Roxanol [morphine], Meperidine, Meperidine and related, Other, Propoxyphene, Tape, and  Tramadol  Medications: Prior to Admission medications   Medication Sig Start Date End Date Taking? Authorizing Provider  aspirin EC 81 MG tablet Take 81 mg by mouth daily.    [provider]  atorvastatin (LIPITOR) 10 MG tablet Take  1 tab po twice a week 11/09/20   Lavera Guise, MD  azelastine (ASTELIN) 0.1 % nasal spray PLACE 1 SPRAY INTO EACH NOSTRIL DAILY ASDIRECTED. 12/28/19   Luiz Ochoa, NP  Calcium Carb-Cholecalciferol 600-200 MG-UNIT TABS Take 1 tablet by mouth daily.    [provider]  cetirizine (ZYRTEC) 10 MG tablet Take 10 mg by mouth daily.    [provider]  esomeprazole (NEXIUM) 40 MG capsule Take 40 mg by mouth daily at 12 noon.    [provider]  fluocinonide (LIDEX) 0.05 % external solution Apply 1 application topically 2 (two) times daily. 08/12/20   Lavera Guise, MD  fluticasone [facility]) 50 MCG/ACT nasal spray Place 1 spray into both nostrils daily. 08/04/20   [provider]  ketoconazole (NIZORAL) 2 % shampoo Apply 1 application topically 2 (two) times a week. 10/10/20   Lavera Guise, MD  montelukast (SINGULAIR) 10 MG tablet TAKE (1) TABLET BY MOUTH DAILY AT BEDTIME 08/15/20   Lavera Guise, MD  Multiple Vitamins-Minerals (MULTIVITAMIN ADULT PO) Take 1 tablet by mouth daily.    [provider]  oxybutynin (DITROPAN) 5 MG tablet TAKE (1) TABLET BY MOUTH EVERY DAY 11/18/20   Lavera Guise, MD  potassium citrate (UROCIT-K) 10 MEQ (1080 MG)  SR tablet Take 1 tablet by mouth twice daily 08/08/20   Lavera Guise, MD  Semaglutide (RYBELSUS) 7 MG TABS Take 7 mg by mouth daily. 09/20/20   Lavera Guise, MD     Family History  Problem Relation Age of Onset   Cancer Mother    Diabetes Father    Heart failure Father    Depression Sister    Stroke Sister    Depression Sister    Cancer Brother    Cancer Brother    Cancer Brother    Diabetes Other     Social History   Socioeconomic History   Marital status: Widowed     Spouse name: Not on file   Number of children: Not on file   Years of education: Not on file   Highest education level: Not on file  Occupational History   Not on file  Tobacco Use   Smoking status: Former    Types: Cigarettes   Smokeless tobacco: Never   Tobacco comments:    quit smoking cigarettes 34 years ago  Vaping Use   Vaping Use: Never used  Substance and Sexual Activity   Alcohol use: No   Drug use: No   Sexual activity: Not on file  Other Topics Concern   Not on file  Social History Narrative   Not on file   Social Determinants of Health   Financial Resource Strain: Low Risk    Difficulty of Paying Living Expenses: Not very hard  Food Insecurity: Not on file  Transportation Needs: Not on file  Physical Activity: Not on file  Stress: Not on file  Social Connections: Not on file      Review of Systems currently denies fever, headache, chest pain, dyspnea, cough, nausea, vomiting or bleeding.  She does have some chronic abdominal and back discomfort.  Vital Signs:pending    Physical Exam awake, alert.  Chest clear to auscultation bilaterally.  Heart with regular rate and rhythm.  Abdomen obese, soft, positive bowel sounds, nontender.  Trace pretibial edema bilaterally.  Imaging: CT Abdomen Pelvis W Contrast  Result Date: 12/26/2020 CLINICAL DATA:  Uterine/cervical carcinoma.  Evaluate lymph node. EXAM: CT ABDOMEN AND PELVIS WITH CONTRAST TECHNIQUE: Multidetector CT imaging of the abdomen and pelvis was performed using the standard protocol following bolus administration of intravenous contrast. CONTRAST:  159mL OMNIPAQUE IOHEXOL 350 MG/ML SOLN COMPARISON:  09/19/2020 FINDINGS: Lower chest: Coronary calcifications. No pleural or pericardial effusion. Punctate calcified subpleural granuloma in the posterior basal segment left lower lobe. Hepatobiliary: Innumerable subcentimeter partially calcified stones layer in the nondilated gallbladder. No liver lesion or  biliary ductal dilatation. Pancreas: Unremarkable. No pancreatic ductal dilatation or surrounding inflammatory changes. Spleen: Normal in size without focal abnormality. Adrenals/Urinary Tract: Normal adrenal glands. Parapelvic renal cysts. No urolithiasis or hydronephrosis. Urinary bladder incompletely distended. Stomach/Bowel: Stomach is physiologically distended, unremarkable. The small bowel is nondistended. Appendix not identified. No pericecal inflammatory changes. The colon is nondilated, unremarkable. Vascular/Lymphatic: No significant arterial findings are present. Enlarged visualized portions of great saphenous veins with multiple associated distended anterior pelvic wall veins. 2.6 x 1.2 cm enlarged right retroperitoneal nodule or lymph node anterior to the mid ureter (5:59 and 2:51), previously 2.4 x 1. No new adenopathy. Reproductive: Status post hysterectomy. No adnexal masses. Other: No ascites.  No free air. Musculoskeletal: Multilevel spondylitic changes in the thoracolumbar spine. Osteitis pubis. Bilateral sacroiliitis. No acute fracture or worrisome bone lesion. IMPRESSION: 1. Enlarging right retroperitoneal lymph node suggesting metastatic disease given  the clinical history. This could be approached percutaneously for biopsy if clinically relevant. 2. Cholelithiasis Electronically Signed   By: Lucrezia Europe M.D.   On: 12/26/2020 10:44    Labs:  CBC: Recent Labs    11/10/20 1130  WBC 4.0  HGB 14.3  HCT 41.5  PLT 266    COAGS: No results for input(s): INR, APTT in the last 8760 hours.  BMP: Recent Labs    07/18/20 1118 09/19/20 1305 11/10/20 1130 12/26/20 0925  NA  --   --  141  --   K  --   --  4.7  --   CL  --   --  102  --   CO2  --   --  25  --   GLUCOSE  --   --  126*  --   BUN  --   --  13  --   CALCIUM  --   --  9.5  --   CREATININE 0.60 0.70 0.73 0.80    LIVER FUNCTION TESTS: Recent Labs    11/10/20 1130  BILITOT 0.4  AST 20  ALT 15  ALKPHOS 120  PROT  6.6  ALBUMIN 3.8    TUMOR MARKERS: No results for input(s): AFPTM, CEA, CA199, CHROMGRNA in the last 8760 hours.  Assessment and Plan: 75 y.o. female with past medical history significant for asthma, benign brain tumor, diabetes, GERD, DJD, hypertension, seizures, and mixed clear-cell/endometrioid endometrial carcinoma ,status post TLH/BSO on 08/11/19 at [facility] as well as radiation therapy.  CT abdomen pelvis on 12/26/2020 revealed:  1. Enlarging right retroperitoneal lymph node suggesting metastatic disease given the clinical history. This could be approached percutaneously for biopsy if clinically relevant. 2. Cholelithiasis  She presents today for image guided biopsy of the enlarged right retroperitoneal lymph node.Risks and benefits of procedure was discussed with the patient including, but not limited to bleeding, infection, damage to adjacent structures or low yield requiring additional tests or inability to perform biopsy due to location or size.  All of the questions were answered and there is agreement to proceed.  Consent signed and in chart.  LABS PENDING  Thank you for this interesting consult.  I greatly enjoyed meeting Cassidy Parsons and look forward to participating in their care.  A copy of this report was sent to the requesting provider on this date.  Electronically Signed: D. Rowe Robert, PA-C 01/05/2021, 9:42 AM   I spent a total of  25 minutes   in face to face in clinical consultation, greater than 50% of which was counseling/coordinating care for image guided right retroperitoneal lymph node biopsy

## 2021-01-05 NOTE — Procedures (Signed)
Interventional Radiology Procedure Note  Date of Procedure: 01/05/2021  Procedure: CT guided right retroperitoneal LN biopsy   Findings:  1. Successful CT guided right retroperitoneal LN biopsy, 18ga x4 passes  2. Post CT showed evolving hematoma in the retroperitoneum, contrast-enhanced CT done, no evident arterial injury    Complications:  Hematoma    Estimated Blood Loss: minimal  Follow-up and Recommendations: 1. Bedrest 4 hours  2. CBC at 1500  3. Monitor vitals, evaluate for potential discharge in the afternoon    Albin Felling, MD  Vascular & Interventional Radiology  01/05/2021 12:46 PM

## 2021-01-06 DIAGNOSIS — E1165 Type 2 diabetes mellitus with hyperglycemia: Secondary | ICD-10-CM | POA: Diagnosis not present

## 2021-01-06 LAB — SURGICAL PATHOLOGY

## 2021-01-11 ENCOUNTER — Other Ambulatory Visit: Payer: Self-pay

## 2021-01-11 ENCOUNTER — Other Ambulatory Visit: Payer: Self-pay | Admitting: Nurse Practitioner

## 2021-01-11 ENCOUNTER — Inpatient Hospital Stay: Payer: Medicare HMO | Attending: Obstetrics and Gynecology | Admitting: Obstetrics and Gynecology

## 2021-01-11 VITALS — BP 156/69 | HR 84 | Temp 97.8°F | Resp 20 | Wt 240.7 lb

## 2021-01-11 DIAGNOSIS — R918 Other nonspecific abnormal finding of lung field: Secondary | ICD-10-CM | POA: Diagnosis not present

## 2021-01-11 DIAGNOSIS — Z87891 Personal history of nicotine dependence: Secondary | ICD-10-CM | POA: Diagnosis not present

## 2021-01-11 DIAGNOSIS — R599 Enlarged lymph nodes, unspecified: Secondary | ICD-10-CM

## 2021-01-11 DIAGNOSIS — I10 Essential (primary) hypertension: Secondary | ICD-10-CM | POA: Insufficient documentation

## 2021-01-11 DIAGNOSIS — F418 Other specified anxiety disorders: Secondary | ICD-10-CM | POA: Diagnosis not present

## 2021-01-11 DIAGNOSIS — C541 Malignant neoplasm of endometrium: Secondary | ICD-10-CM | POA: Diagnosis not present

## 2021-01-11 DIAGNOSIS — R69 Illness, unspecified: Secondary | ICD-10-CM | POA: Diagnosis not present

## 2021-01-11 DIAGNOSIS — E119 Type 2 diabetes mellitus without complications: Secondary | ICD-10-CM | POA: Diagnosis not present

## 2021-01-11 DIAGNOSIS — Z809 Family history of malignant neoplasm, unspecified: Secondary | ICD-10-CM | POA: Insufficient documentation

## 2021-01-11 NOTE — Progress Notes (Signed)
Reviewed biopsy and ct results with Dr. Fransisca Connors who recommends PET scan in one month.   She is concerned of financial impact of additional scans. I recommended she speak with our financial department to apply for assistance. She is also grieving loss of step daughter 4 days ago in [place] and has planned trip to [place] in summer for burial. Symptomatically she feels at baseline and denies complaints.   Will order and get scan approved by insurance and have scheduler reach out to her with appointment.

## 2021-01-11 NOTE — Progress Notes (Signed)
Gynecologic Oncology Interval Visit   Referring Provider: Dr. Kenton Kingfisher  Chief Complaint: 1A mixed clear cell/endometrioid endometrial carcinoma the endometrium status post TLH/BSO, radiation  Subjective:  Cassidy Parsons is a 76 y.o. 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 Initial pathology from endometrial biopsy was reviewed at [facility] and confirmed clear cell carcinoma. She returns to clinic for discussion of biopsy results and treatment planning.   A. LYMPH NODE, RIGHT RETROPERITONEUM; CT-GUIDED BIOPSY:  - FRAGMENTS OF SMOOTH MUSCLE, ADIPOSE TISSUE, AND POSSIBLE WALL OF  VASCULAR STRUCTURE.  - LYMPHOID TISSUE IS NOT PRESENT FOR EVALUATION.  - NEGATIVE FOR MALIGNANCY.   PET scan was recommended by Dr Fransisca Connors however, patient is anxious regarding this recommendation and requests to discuss in person.    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  Patient had ~ 5 year history of PMB, worse since May 2021 after she fell on her stomach. On September 02, 2022 underwent endometrial biopsy. - Clear cell carcinoma, FIGO grade 3  Pap 2019-09-02- NILM  Husband died in 2022-05-08 and she delayed self care because she was taking care of him.   CT scan C/A/P 08/07/19 did not show metastatic disease. Impression:  1. Heterogeneous and thickened appearance of the endometrium in keeping with patients known primary malignancy.  2. No definite evidence of metastatic disease in the chest, abdomen, or pelvis.  3. Bilateral pulmonary nodules, nonspecific. Recommend continued attention on follow-up.  4. Mild thickening of the sigmoid colon and tethering of the superior bladder wall, favored sequela of prior diverticulitis.   Underwent TLH,  BSx/RO SLN mapping at [facility] 08/11/19.  Grade 2 endometrioid 4.2 cm cancer with 26% invasion.  Negative right adnexa and cervix and no LVSI.  Left adnexa could not be removed due to dense adhesions of sigmoid colon secondary to severe diverticular disease.   Comment: The tumor demonstrates foci of well-formed endometrioid glands with defined nuclear polarity, but a considerable portion of the tumor appears solid, with large areas of squamous differentiation.  Morphologic features of clear cell carcinoma, such as marked nuclear atypia, hobnail architecture, hyalinized stroma, and intracytoplasmic eosinophilic globules, are not identified.  Secretory change is present in some cells, which stain positive for the markers Napsin A and AMACR.  The reported history of a prior biopsy with clear cell carcinoma is noted.  Review of this biopsy may be beneficial to exclude the possibility of a mixed clear cell/endometrioid tumor, if this diagnosis would influence patient management.  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   Slides were reviewed at [facility]. Dr. Fransisca Connors recommended adjuvant WPRT.   10/20/19-11/23/19 completed WPRT. Suffered diarrhea and fatigue during treatment and she has a h/o urinary frequency unrelieved by oxybutynin.   History of pulmonary nodules:  Previous imaging showed bilateral pulmonary nodules, 2-3 mm nonspecific on CT scan. Per patient, results were reassuring. [facility] radiology regarding read on 07/18/20 CT scan. No further follow up needed given stability.   09/19/2020 CT ABDOMEN AND PELVIS WITH CONTRAST  Soft tissue nodule at the right aspect of the aorta, anterior to the right ureter, measuring up to 10 x 8 x 24 mm (series 2, image 49 and series 6, image 118), which does not fill with contrast on the delayed  sequence.   12/26/20 CT ABDOMEN AND PELVIS WITH CONTRAST Enlarged visualized portions of great saphenous veins with multiple associated distended  anterior pelvic wall veins. 2.6 x 1.2 cm enlarged right retroperitoneal nodule or lymph node anterior to the mid ureter (5:59 and 2:51), previously 2.4 x 1. No new adenopathy.     Problem List: Patient Active Problem List   Diagnosis Date Noted   Primary clear cell adenocarcinoma of endometrium ([place]) 12/23/2019   Morbid obesity with BMI of 45.0-49.9, adult ([place]) 08/12/2019   Urinary tract infection without hematuria 08/07/2018   Dysuria 08/07/2018   Osteoarthritis of knee 04/11/2017   Low back pain 02/05/2017   Mild persistent asthma, uncomplicated 58/85/0277   Rhinitis, allergic 02/05/2017   GAD (generalized anxiety disorder) 02/05/2017   Unspecified complication of internal orthopedic prosthetic device, implant and graft, initial encounter ([place]) 02/05/2017   Diabetes mellitus without complication ([place]) 41/28/7867   Gout, unspecified 02/05/2017   Occlusion and stenosis of bilateral carotid arteries 02/05/2017   Insomnia, unspecified 02/05/2017   Sleep apnea 02/05/2017   Elevated blood-pressure reading without diagnosis of hypertension 02/05/2017   Dupuytren's disease of palm 10/05/2016   Exposed orthopaedic hardware ([place]) 02/10/2015    Past Medical History: Past Medical History:  Diagnosis Date   Asthma    Brain tumor (benign) ([place])    right side   Diabetes mellitus without complication ([place])    borderline   Early cataracts, bilateral    Environmental and seasonal allergies    GERD (gastroesophageal reflux disease)    Hypertension    PMH:   On home oxygen therapy    pt is no longer  using oxygen at night   Seizures ([place])    haven't had a seizure since brain surgery in 2001   Uterine cancer [facility])     Past Surgical History: Past Surgical History:  Procedure Laterality Date   BRAIN SURGERY     BREAST SURGERY     biopsy right breast   CRANIOTOMY N/A 02/10/2015   Procedure: Removal of cranial plate with scalp wound revision;  Surgeon: Earnie Larsson, MD;  Location: [facility]  NEURO ORS;  Service: Neurosurgery;  Laterality: N/A;   DIAGNOSTIC LAPAROSCOPY     panniculectomy   PARTIAL HYSTERECTOMY     TONSILLECTOMY      Past Gynecologic History:  Menarche: age 70-11 Post menopausal Post menopausal bleeding    OB History:  OB History  Gravida Para Term Preterm AB Living  _0 SAB IAB Ectopic Multiple Live Births               # Outcome Date GA Lbr Len/2nd Weight Sex Delivery Anes PTL Lv  2 AB           1 Term             Family History: Family History  Problem Relation Age of Onset   Cancer Mother    Diabetes Father    Heart failure Father    Depression Sister    Stroke Sister    Depression Sister    Cancer Brother    Cancer Brother    Cancer Brother    Diabetes Other     Social History: Social History   Socioeconomic History   Marital status: Widowed    Spouse name: Not on file   Number of children: Not on file   Years of education: Not on file   Highest education level: Not on file  Occupational History   Not on file  Tobacco Use   Smoking status: Former    Types: Cigarettes   Smokeless tobacco: Never   Tobacco comments:    quit smoking cigarettes 34 years ago  Vaping Use   Vaping Use: Never used  Substance and Sexual Activity   Alcohol use: No   Drug use: No   Sexual activity: Not on file  Other Topics Concern   Not on file  Social History Narrative   Not on file   Social Determinants of Health   Financial Resource Strain: Low Risk    Difficulty of Paying Living Expenses: Not very hard  Food Insecurity: Not on file  Transportation Needs: Not on file  Physical Activity: Not on file  Stress: Not on file  Social Connections: Not on file  Intimate Partner Violence: Not on file   Immunization History  Administered Date(s) Administered   Influenza-Unspecified 10/22/2016, 09/21/2019, 10/10/2020   Moderna SARS-COV2 Booster Vaccination 12/15/2019, 11/04/2020   Moderna Sars-Covid-2 Vaccination 03/02/2019,  03/31/2019, 06/28/2020   Pneumococcal Conjugate-13 07/23/2015   Pneumococcal Polysaccharide-23 02/18/2018   Zoster, Live 10/16/2012   Zoster, Unspecified 10/16/2012   Allergies: Allergies  Allergen Reactions   Dust Mite Extract    Mold Extract  [Trichophyton]    Molds & Smuts Other (See Comments)   Pollen Extract    Roxanol [Morphine] Other (See Comments)    Extreme pain   Meperidine Rash    Itching   Meperidine And Related Itching and Rash   Other Itching, Rash and Other (See Comments)    All pain relievers except hydrocodone Trees, mold, dust, and perfume allergies (sneezing, eyes swell up)   Propoxyphene Itching and Rash   Tape Itching and Rash    With paper tape.  Please use adhesive tape Rash   Tramadol Itching and Rash    Current Medications: Current Outpatient Medications  Medication Sig Dispense Refill   aspirin EC 81 MG tablet Take 81 mg by mouth daily.     atorvastatin (LIPITOR) 10 MG tablet Take  1 tab po twice a week 90 tablet 1   azelastine (ASTELIN) 0.1 % nasal spray PLACE 1 SPRAY INTO EACH NOSTRIL DAILY ASDIRECTED. 30 mL 3   Calcium Carb-Cholecalciferol 600-200 MG-UNIT TABS Take 1 tablet by mouth daily.     cetirizine (ZYRTEC) 10 MG tablet Take 10 mg by mouth daily.     esomeprazole (NEXIUM) 40 MG capsule Take 40 mg by mouth daily at 12 noon.     fluocinonide (LIDEX) 0.05 % external solution Apply 1 application topically 2 (two) times daily. 60 mL 1   fluticasone (FLONASE) 50 MCG/ACT nasal spray Place 1 spray into both nostrils daily.     ketoconazole (NIZORAL) 2 % shampoo Apply 1 application topically 2 (two) times a week. 120 mL 1   montelukast (SINGULAIR) 10 MG tablet TAKE (1) TABLET BY MOUTH DAILY AT BEDTIME 90 tablet 1   Multiple Vitamins-Minerals (MULTIVITAMIN ADULT PO) Take 1 tablet by mouth daily.     oxybutynin (DITROPAN) 5 MG tablet TAKE (1) TABLET BY MOUTH EVERY DAY 90 tablet 0   potassium citrate (UROCIT-K) 10 MEQ (1080 MG) SR tablet Take 1 tablet  by mouth twice daily 180 tablet 1   Semaglutide (RYBELSUS) 7 MG TABS Take 7 mg by mouth daily. 90 tablet 1   No current facility-administered medications for this visit.   Review of Systems General:  no complaints Skin: no complaints Eyes: no complaints HEENT: no complaints Breasts:  no complaints Pulmonary: no complaints Cardiac: no complaints Gastrointestinal: no complaints Genitourinary/Sexual: no complaints Ob/Gyn: no complaints Hematology: no complaints Neurologic/Psych: depression & anxiety  Objective:  Physical Examination:  BP (!) 156/69    Pulse 84    Temp 97.8 F (36.6 C)    Resp 20    Wt 240 lb 11.2 oz (109.2 kg)    SpO2 100%    BMI 45.48 kg/m    ECOG Performance Status: 1 - Symptomatic but completely ambulatory  GENERAL: Patient is a well appearing female in no acute distress. Unaccompanied.  SKIN:  Clear with no obvious rashes or skin changes.  NEURO:  Nonfocal. Well oriented.  Appropriate affect.   Pelvic: deferred    Assessment:  Cassidy Parsons is a 76 y.o. female diagnosed with 1A mixed clear cell/endometrioid endometrial carcinoma (MSI; dMMR with loss of MLH1/PMS2 with methylated MLH1 alleles present) s/p TLH, [facility] 08/11/19. SLN mapping unsuccessful and left adnexa could not be removed due to severe adhesions from diverticular disease.  No LVSI, or cervical/adnexal involvement and invades 23% of the myometrium. Pre-op CT scan negative for metastatic disease. Treated with whole pelvic radiation 11/21.  On exam earlier this year concern for possible vaginal cuff mass, but this could represent left adnexa. Prompted CT scan 9/22 that was negative other than 2.4 cm nodule most likely lymph node.  This looks to be above the pelvic RT field on the right near the IVC.  Now CT scan in 12/22 shows slight increase in the node to 2.6 cm. She underwent biopsy which was negative but no lymphatic tissue present on biopsy.   Pulmonary nodules, 07/2019 on CT chest, on follow up CT  at [place] 2022 report stable. No additional imaging was recommended.   Depression and anxiety  Medical co-morbidities complicating care: HTN, morbid obesity (There is no height or weight on file to calculate BMI.), prediabetes, possible h/o carotid stenosis.  Plan:   Problem List Items Addressed This Visit   None  We review options including recommendation to undergo PET scan to evaluate further. Patient is agreeable. Follow up based on results. If node PET positive, could consider radiation as this was likely above prior radiation field. She would also be a candidate for chemotherapy or immunotherapy with pembrolizumab.   Verlon Au, NP  I personally interviewed and examined the patient. Agreed with the above/below plan of care. I have directly contributed to assessment and plan of care of this patient and educated and discussed with patient and family.   Mellody Drown, MD

## 2021-01-12 ENCOUNTER — Ambulatory Visit
Admission: RE | Admit: 2021-01-12 | Discharge: 2021-01-12 | Disposition: A | Discharge status: Home / Self Care | Payer: Medicare HMO | Source: Ambulatory Visit | Attending: Nurse Practitioner | Admitting: Nurse Practitioner

## 2021-01-12 DIAGNOSIS — R599 Enlarged lymph nodes, unspecified: Secondary | ICD-10-CM | POA: Insufficient documentation

## 2021-01-12 DIAGNOSIS — E1165 Type 2 diabetes mellitus with hyperglycemia: Secondary | ICD-10-CM | POA: Diagnosis not present

## 2021-01-12 DIAGNOSIS — I2510 Atherosclerotic heart disease of native coronary artery without angina pectoris: Secondary | ICD-10-CM | POA: Diagnosis not present

## 2021-01-12 DIAGNOSIS — I700 Atherosclerosis of aorta: Secondary | ICD-10-CM | POA: Diagnosis not present

## 2021-01-12 DIAGNOSIS — C541 Malignant neoplasm of endometrium: Secondary | ICD-10-CM | POA: Insufficient documentation

## 2021-01-12 DIAGNOSIS — K8020 Calculus of gallbladder without cholecystitis without obstruction: Secondary | ICD-10-CM | POA: Diagnosis not present

## 2021-01-12 LAB — GLUCOSE, CAPILLARY: Glucose-Capillary: 141 mg/dL — ABNORMAL HIGH (ref 70–99)

## 2021-01-12 MED ORDER — FLUDEOXYGLUCOSE F - 18 (FDG) INJECTION
12.5000 | Freq: Once | INTRAVENOUS | Status: AC | PRN
  Administered 2021-01-12: 12.5 via INTRAVENOUS

## 2021-01-18 ENCOUNTER — Telehealth: Payer: Self-pay | Admitting: *Deleted

## 2021-01-18 NOTE — Telephone Encounter (Signed)
Patient called and states she saw results biut unsure of meaning. She does not have follow up appointment as that was to be decided determinate of PET results. Please return her call to discuss results and any follow up needed.  IMPRESSION: 1. Soft tissue stranding of the right retroperitoneum with mild FDG uptake, likely postprocedural changes. Previously described enlarged right retroperitoneal lymph node is obscured and FDG avidity cannot be characterized. 2. No additional sites of hypermetabolic activity to suggest metastatic disease. 3. Sclerosis of the sacrum with associated linear lucencies and mild FDG uptake, findings are similar to prior exams and likely due to chronic sacral insufficiency fractures. 4.  Aortic Atherosclerosis (ICD10-I70.0).     Electronically Signed   By: Yetta Glassman M.D.   On: 01/12/2021 14:19

## 2021-01-20 ENCOUNTER — Encounter: Payer: Self-pay | Admitting: Obstetrics and Gynecology

## 2021-01-20 ENCOUNTER — Telehealth: Payer: Self-pay | Admitting: Nurse Practitioner

## 2021-01-20 DIAGNOSIS — E785 Hyperlipidemia, unspecified: Secondary | ICD-10-CM | POA: Diagnosis not present

## 2021-01-20 DIAGNOSIS — J309 Allergic rhinitis, unspecified: Secondary | ICD-10-CM | POA: Diagnosis not present

## 2021-01-20 DIAGNOSIS — Z6841 Body Mass Index (BMI) 40.0 and over, adult: Secondary | ICD-10-CM | POA: Diagnosis not present

## 2021-01-20 DIAGNOSIS — I951 Orthostatic hypotension: Secondary | ICD-10-CM | POA: Diagnosis not present

## 2021-01-20 DIAGNOSIS — R69 Illness, unspecified: Secondary | ICD-10-CM | POA: Diagnosis not present

## 2021-01-20 DIAGNOSIS — G8929 Other chronic pain: Secondary | ICD-10-CM | POA: Diagnosis not present

## 2021-01-20 DIAGNOSIS — J449 Chronic obstructive pulmonary disease, unspecified: Secondary | ICD-10-CM | POA: Diagnosis not present

## 2021-01-20 DIAGNOSIS — E11620 Type 2 diabetes mellitus with diabetic dermatitis: Secondary | ICD-10-CM | POA: Diagnosis not present

## 2021-01-20 DIAGNOSIS — Z008 Encounter for other general examination: Secondary | ICD-10-CM | POA: Diagnosis not present

## 2021-01-20 DIAGNOSIS — K219 Gastro-esophageal reflux disease without esophagitis: Secondary | ICD-10-CM | POA: Diagnosis not present

## 2021-01-20 DIAGNOSIS — L409 Psoriasis, unspecified: Secondary | ICD-10-CM | POA: Diagnosis not present

## 2021-01-20 DIAGNOSIS — M1990 Unspecified osteoarthritis, unspecified site: Secondary | ICD-10-CM | POA: Diagnosis not present

## 2021-01-20 NOTE — Telephone Encounter (Signed)
PET results reviewed with patient. Advised her that I'd like Dr. Fransisca Connors to review results as well.

## 2021-01-25 ENCOUNTER — Encounter: Payer: Self-pay | Admitting: Nurse Practitioner

## 2021-01-25 ENCOUNTER — Ambulatory Visit (INDEPENDENT_AMBULATORY_CARE_PROVIDER_SITE_OTHER): Payer: Medicare HMO | Admitting: Nurse Practitioner

## 2021-01-25 ENCOUNTER — Other Ambulatory Visit: Payer: Self-pay

## 2021-01-25 VITALS — BP 150/90 | HR 85 | Temp 98.6°F | Resp 16 | Ht 61.0 in | Wt 237.0 lb

## 2021-01-25 DIAGNOSIS — R030 Elevated blood-pressure reading, without diagnosis of hypertension: Secondary | ICD-10-CM

## 2021-01-25 DIAGNOSIS — C541 Malignant neoplasm of endometrium: Secondary | ICD-10-CM | POA: Diagnosis not present

## 2021-01-25 DIAGNOSIS — E876 Hypokalemia: Secondary | ICD-10-CM

## 2021-01-25 DIAGNOSIS — E1165 Type 2 diabetes mellitus with hyperglycemia: Secondary | ICD-10-CM

## 2021-01-25 MED ORDER — POTASSIUM CITRATE ER 10 MEQ (1080 MG) PO TBCR: EXTENDED_RELEASE_TABLET | ORAL | 1 refills | Status: DC

## 2021-01-25 NOTE — Progress Notes (Signed)
[facility] [place], [place] [address]  Internal MEDICINE  Office Visit Note  Patient Name: Cassidy Parsons  [id number]  [id number]  Date of Service: 01/25/2021  Chief Complaint  Patient presents with   Follow-up   Diabetes   Gastroesophageal Reflux   Hypertension   Asthma    HPI Cassidy Parsons presents for a follow up visit for diabetes and hypertension. This is a little early for her A1C to be checked again so she will schedule a nurse visit in February for her A1C check. She has white coat syndrome. Her blood pressure is normal at home but elevated in the clinic.   She had a PET scan done and no active areas indicative of cancer were identified on the scan.       Current Medication: Outpatient Encounter Medications as of 01/25/2021  Medication Sig   aspirin EC 81 MG tablet Take 81 mg by mouth daily.   atorvastatin (LIPITOR) 10 MG tablet Take  1 tab po twice a week   azelastine (ASTELIN) 0.1 % nasal spray PLACE 1 SPRAY INTO EACH NOSTRIL DAILY ASDIRECTED.   Calcium Carb-Cholecalciferol 600-200 MG-UNIT TABS Take 1 tablet by mouth daily.   cetirizine (ZYRTEC) 10 MG tablet Take 10 mg by mouth daily.   esomeprazole (NEXIUM) 40 MG capsule Take 40 mg by mouth daily at 12 noon.   fluocinonide (LIDEX) 0.05 % external solution Apply 1 application topically 2 (two) times daily.   fluticasone (FLONASE) 50 MCG/ACT nasal spray Place 1 spray into both nostrils daily.   ketoconazole (NIZORAL) 2 % shampoo Apply 1 application topically 2 (two) times a week.   Multiple Vitamins-Minerals (MULTIVITAMIN ADULT PO) Take 1 tablet by mouth daily.   Semaglutide (RYBELSUS) 7 MG TABS Take 7 mg by mouth daily.   [DISCONTINUED] montelukast (SINGULAIR) 10 MG tablet TAKE (1) TABLET BY MOUTH DAILY AT BEDTIME   [DISCONTINUED] oxybutynin (DITROPAN) 5 MG tablet TAKE (1) TABLET BY MOUTH EVERY DAY   [DISCONTINUED] potassium citrate (UROCIT-K) 10 MEQ (1080 MG) SR tablet Take 1 tablet by mouth  twice daily   potassium citrate (UROCIT-K) 10 MEQ (1080 MG) SR tablet Take 1 tablet by mouth twice daily   No facility-administered encounter medications on file as of 01/25/2021.    Surgical History: Past Surgical History:  Procedure Laterality Date   BRAIN SURGERY     BREAST SURGERY     biopsy right breast   CRANIOTOMY N/A 02/10/2015   Procedure: Removal of cranial plate with scalp wound revision;  Surgeon: Earnie Larsson, MD;  Location: [facility];  Service: Neurosurgery;  Laterality: N/A;   DIAGNOSTIC LAPAROSCOPY     panniculectomy   PARTIAL HYSTERECTOMY     TONSILLECTOMY      Medical History: Past Medical History:  Diagnosis Date   Asthma    Brain tumor (benign) ([place])    right side   Diabetes mellitus without complication ([place])    borderline   Early cataracts, bilateral    Environmental and seasonal allergies    GERD (gastroesophageal reflux disease)    Hypertension    PMH:   On home oxygen therapy    pt is no longer  using oxygen at night   Seizures ([place])    haven't had a seizure since brain surgery in 2001   Uterine cancer ([place])     Family History: Family History  Problem Relation Age of Onset   Cancer Mother    Diabetes Father    Heart failure  Father    Depression Sister    Stroke Sister    Depression Sister    Cancer Brother    Cancer Brother    Cancer Brother    Diabetes Other     Social History   Socioeconomic History   Marital status: Widowed    Spouse name: Not on file   Number of children: Not on file   Years of education: Not on file   Highest education level: Not on file  Occupational History   Not on file  Tobacco Use   Smoking status: Former    Types: Cigarettes   Smokeless tobacco: Never   Tobacco comments:    quit smoking cigarettes 34 years ago  Vaping Use   Vaping Use: Never used  Substance and Sexual Activity   Alcohol use: No   Drug use: No   Sexual activity: Not on file  Other Topics Concern   Not on file  Social  History Narrative   Not on file   Social Determinants of Health   Financial Resource Strain: Low Risk    Difficulty of Paying Living Expenses: Not very hard  Food Insecurity: Not on file  Transportation Needs: Not on file  Physical Activity: Not on file  Stress: Not on file  Social Connections: Not on file  Intimate Partner Violence: Not on file      Review of Systems  Constitutional:  Negative for chills, fatigue and unexpected weight change.  HENT:  Negative for congestion, rhinorrhea, sneezing and sore throat.   Eyes:  Negative for redness.  Respiratory:  Negative for cough, chest tightness and shortness of breath.   Cardiovascular:  Negative for chest pain and palpitations.  Gastrointestinal:  Negative for abdominal pain, constipation, diarrhea, nausea and vomiting.  Genitourinary:  Negative for dysuria and frequency.  Musculoskeletal:  Negative for arthralgias, back pain, joint swelling and neck pain.  Skin:  Negative for rash.  Neurological: Negative.  Negative for tremors and numbness.  Hematological:  Negative for adenopathy. Does not bruise/bleed easily.  Psychiatric/Behavioral:  Negative for behavioral problems (Depression), sleep disturbance and suicidal ideas. The patient is not nervous/anxious.    Vital Signs: BP (!) 150/90    Pulse 85    Temp 98.6 F (37 C)    Resp 16    Ht 5\' 1"  (1.549 m)    Wt 237 lb (107.5 kg)    SpO2 96%    BMI 44.78 kg/m    Physical Exam Vitals reviewed.  Constitutional:      General: She is not in acute distress.    Appearance: Normal appearance. She is obese. She is not ill-appearing.  HENT:     Head: Normocephalic and atraumatic.  Eyes:     Pupils: Pupils are equal, round, and reactive to light.  Cardiovascular:     Rate and Rhythm: Normal rate and regular rhythm.  Pulmonary:     Effort: Pulmonary effort is normal. No respiratory distress.  Neurological:     Mental Status: She is alert and oriented to person, place, and time.      Cranial Nerves: No cranial nerve deficit.     Coordination: Coordination normal.     Gait: Gait normal.  Psychiatric:        Mood and Affect: Mood normal.        Behavior: Behavior normal.       Assessment/Plan: 1. Type 2 diabetes mellitus with hyperglycemia, without long-term current use of insulin (HCC) A1C is due in early  February. She is currently taking rybelsus 7 mg daily.   2. White coat syndrome without diagnosis of hypertension Elevated BP in office but blood pressure is within normal limits when at home per patient report.   3. Hypokalemia Refills ordered - potassium citrate (UROCIT-K) 10 MEQ (1080 MG) SR tablet; Take 1 tablet by mouth twice daily  Dispense: 180 tablet; Refill: 1  4. Primary clear cell adenocarcinoma of endometrium [facility]) PET scan was clear, followed by oncology.    General Counseling: gracin soohoo understanding of the findings of todays visit and agrees with plan of treatment. I have discussed any further diagnostic evaluation that may be needed or ordered today. We also reviewed her medications today. she has been encouraged to call the office with any questions or concerns that should arise related to todays visit.    No orders of the defined types were placed in this encounter.   Meds ordered this encounter  Medications   potassium citrate (UROCIT-K) 10 MEQ (1080 MG) SR tablet    Sig: Take 1 tablet by mouth twice daily    Dispense:  180 tablet    Refill:  1    Return in about 19 days (around 02/13/2021) for please schedule nurse visit for A1C check during first week of feb after 02/10/21. .   Total time spent:30 Minutes Time spent includes review of chart, medications, test results, and follow up plan with the patient.   [place] Controlled Substance Database was reviewed by me.  This patient was seen by Jonetta Osgood, FNP-C in collaboration with Dr. Clayborn Bigness as a part of collaborative care agreement.   Alverda Nazzaro R. Valetta Fuller, MSN,  FNP-C Internal medicine

## 2021-01-26 ENCOUNTER — Telehealth: Payer: Self-pay | Admitting: Student-PharmD

## 2021-01-26 NOTE — Progress Notes (Signed)
°  Chronic Care Management Pharmacy Assistant   Name: Cassidy Parsons  MRN: [id number] DOB: 1945/07/12   Reason for Encounter: PAP  Medications: Outpatient Encounter Medications as of 01/26/2021  Medication Sig   aspirin EC 81 MG tablet Take 81 mg by mouth daily.   atorvastatin (LIPITOR) 10 MG tablet Take  1 tab po twice a week   azelastine (ASTELIN) 0.1 % nasal spray PLACE 1 SPRAY INTO EACH NOSTRIL DAILY ASDIRECTED.   Calcium Carb-Cholecalciferol 600-200 MG-UNIT TABS Take 1 tablet by mouth daily.   cetirizine (ZYRTEC) 10 MG tablet Take 10 mg by mouth daily.   esomeprazole (NEXIUM) 40 MG capsule Take 40 mg by mouth daily at 12 noon.   fluocinonide (LIDEX) 0.05 % external solution Apply 1 application topically 2 (two) times daily.   fluticasone (FLONASE) 50 MCG/ACT nasal spray Place 1 spray into both nostrils daily.   ketoconazole (NIZORAL) 2 % shampoo Apply 1 application topically 2 (two) times a week.   montelukast (SINGULAIR) 10 MG tablet TAKE (1) TABLET BY MOUTH DAILY AT BEDTIME   Multiple Vitamins-Minerals (MULTIVITAMIN ADULT PO) Take 1 tablet by mouth daily.   oxybutynin (DITROPAN) 5 MG tablet TAKE (1) TABLET BY MOUTH EVERY DAY   potassium citrate (UROCIT-K) 10 MEQ (1080 MG) SR tablet Take 1 tablet by mouth twice daily   Semaglutide (RYBELSUS) 7 MG TABS Take 7 mg by mouth daily.   No facility-administered encounter medications on file as of 01/26/2021.   New patient assistance application form filled out to [employer] for Rybelsus. Waiting for patient and provider to complete and sign documentation. Called patient to inquire if they wanted the application mailed to them or if they wanted to come into the office. Patient is required to sign application and to bring/have proof of income. She stated  She would be willing to come into office to bring proof of income and sign application once the paper comes in the mail she would like it mailed to their residence address [name] [address].  Charlann Lange, [place]  [phone]

## 2021-02-02 ENCOUNTER — Telehealth: Payer: Self-pay

## 2021-02-02 DIAGNOSIS — C541 Malignant neoplasm of endometrium: Secondary | ICD-10-CM

## 2021-02-02 NOTE — Telephone Encounter (Signed)
Dr. Fransisca Connors has reviewed PET scan results which were uninformative and recommended come back to clinic in 3-4 months with CT scan. She prefers to do 4 months. Orders placed and scheduling notified.

## 2021-02-10 ENCOUNTER — Ambulatory Visit (INDEPENDENT_AMBULATORY_CARE_PROVIDER_SITE_OTHER): Payer: Medicare HMO

## 2021-02-10 ENCOUNTER — Other Ambulatory Visit: Payer: Self-pay

## 2021-02-10 DIAGNOSIS — E1165 Type 2 diabetes mellitus with hyperglycemia: Secondary | ICD-10-CM

## 2021-02-10 LAB — POCT GLYCOSYLATED HEMOGLOBIN (HGB A1C): Hemoglobin A1C: 6.7 % — AB (ref 4.0–5.6)

## 2021-02-10 NOTE — Progress Notes (Signed)
Pt was here for A1c check  it was 6.7 advised her continue same med and keep follow up appt

## 2021-02-16 ENCOUNTER — Other Ambulatory Visit: Payer: Self-pay

## 2021-02-16 MED ORDER — MONTELUKAST SODIUM 10 MG PO TABS: ORAL_TABLET | ORAL | 1 refills | Status: DC

## 2021-02-16 MED ORDER — OXYBUTYNIN CHLORIDE 5 MG PO TABS: ORAL_TABLET | ORAL | 0 refills | Status: DC

## 2021-02-24 ENCOUNTER — Encounter: Payer: Self-pay | Admitting: Nurse Practitioner

## 2021-03-15 ENCOUNTER — Other Ambulatory Visit: Payer: Self-pay

## 2021-03-15 ENCOUNTER — Ambulatory Visit: Payer: Medicare HMO | Admitting: Student-PharmD

## 2021-03-15 DIAGNOSIS — J309 Allergic rhinitis, unspecified: Secondary | ICD-10-CM

## 2021-03-15 DIAGNOSIS — E1165 Type 2 diabetes mellitus with hyperglycemia: Secondary | ICD-10-CM

## 2021-03-15 NOTE — Progress Notes (Signed)
Follow Up Pharmacist Visit  ? ?Cassidy Parsons,Cassidy Parsons [id number] ?53 years, Female  DOB: [birth year]  M: [phone] ? ?Clinical Summary ?Patient Risk: Moderate ?Next CCM Follow Up: 11/15/21 ?Next AWV: 11/09/21 ?Next PCP Visit: 05/24/21 ?Summary for PCP: Patient's diabetes is currently well-controlled with Rybelsus '7mg'$  (1/2 tab daily). Patient states PAP is being pursued so patient can get '3mg'$  tab at no charge. Patient is not currently taking an ACE/ARB for kidney protection and BP is elevated at office visits. Will discuss potentially adding a low dose ACE/ARB with PCP. ? ?Patient Chart Prep  ?Completed by Charlann Lange on 03/13/2021 ? ?Chronic Conditions ?Patient's Chronic Conditions: Allergic rhinitis, Diabetes (DM), Anxiety, Insomnia ? ?Doctor and Hospital Visits ?Were there PCP Visits since last visit with the Pharmacist?: Yes ?Visit #1: 01/25/21 Jonetta Osgood, NP. For Type 2 diabetes mellitus with hyperglycemia, without long-term current use of insulin. No medication changes. ?Were there Specialist Visits since last visit with the Pharmacist?: Yes ?Visit #1: 12/28/20 Oncology Mellody Drown, MD. For Primary clear cell adenocarcinoma of endometrium. No medication changes. ?Visit #2: 01/11/21 Oncology Mellody Drown, MD. For Primary clear cell adenocarcinoma of endometrium. No medication changes. ?Was there a Hospital Visit in last 30 days?: No ?Were there other Hospital Visits since last visit with the Pharmacist?: No ? ?Medication Information ?Have there been any medication changes from PCP or Specialist since last visit with the Pharmacist?: No ?Are there any Medication adherence gaps (beyond 5 days past due)?: No ?Medication adherence rates for the STAR rating drugs: Atorvastatin 10 mg 01/25/21 98 DS ?List Patient's current Care Gaps: No current Care Gaps identified ? ?Pre-Call Questions  ?Completed by Charlann Lange on 03/13/2021 ? ?Are you able to connect with Patient: Yes ?Confirmed appointment  date/time with patient/caregiver?: Yes ?Date/time of the appointment: 03/15/21 at 3:45 PM [phone]  ?Visit type: Phone ?Patient/Caregiver instructed to bring medications to appointment: Yes ?What, if any, problems do you have getting your medications from the pharmacy?: None ?What is your top health concern to discuss at your upcoming visit?: Patient stated none. ?Have you seen any other providers since your last visit?: No ? ?Disease Assessments ? ?Subjective Information ?Current BP: 150/90 ?Current HR: 85 ?taken on: 01/25/2021 ?Weight: 237 ?BMI: 44.78 ?Last GFR: 87 ?taken on: 11/09/2020 ?Visit Completed on: 03/15/2021 ?Why did the patient present?: CCM F/U Visit ?Patient pleased with health care they are receiving?: Yes ?Factors that may affect medication adherence?: Financial hardship (medication copays) ?Is Patient using [facility]?: No ?Name and location of Current pharmacy: [facility] ?Current Rx insurance plan: [employer] ?Are meds synced by current pharmacy?: No ?Are meds delivered by current pharmacy?: No - delivery available but patient prefers to not use ?Would patient benefit from direct intervention of clinical lead in dispensing process to optimize clinical outcomes?: Yes ?Are [facility] pharmacy services available where patient lives?: Yes ?Is patient disadvantaged to use [facility]?: No ?[facility] services reviewed with patient and patient wishes to change pharmacy?: No ?Select reason patient declined to change pharmacies: Loyalty to current pharmacy ?Does patient experience delays in picking up medications due to transportation concerns (getting to pharmacy)?: No ?Any additional demeanor/mood notes?: Patient presents via telephone and was very pleasant to speak with. ? ?Diabetes (DM) ?Current A1C: 6.7 ?taken on: 02/10/2021 ?Type: 2 ?The current microalbumin ratio is: 30 ?tested on: 08/28/2020 ?Last DM Foot Exam on: 11/23/2020 ?Last DM Eye Exam on: 09/07/2020 ?Assess this condition  today?: Yes ?Goal A1C: < 7.0 % ?Type: 2 ?Is Patient taking statin  medication: Yes ?Is patient taking ACEi / ARB?: No ?Reason patient is not taking ACEi / ARB: unclear ?Does Patient use RPM device?: No ?DM RPM device: Does patient qualify?: No ?Patient checking Blood Sugar at home?: No ?Has patient experienced any hypoglycemic episodes?: No ?Diet recall discussed: No ?We discussed: Modifying lifestyle, including to participate in moderate physical activity (e.g., walking) at least 150 minutes per week. ?Assessment:: Controlled ?Drug: Rybelsus '7mg'$  (1/2 tab daily) ?Assessment: Appropriate, Effective, Safe, Accessible ?Additional Info: Patient is currently taking 1/2 tab of rybelsus '7mg'$  to get close to her target dose of '3mg'$ , which controls her sugars well. PAP is being pursued for the rybelsus '3mg'$  and in the meantime, this helps save the patient money. Patient is currently not taking an ACE/ARB, which would help provide patient with kidney protection given her diabetes. Patient's BP has been elevated in office visits so patient would likely benefit and be able to tolerate. Will discuss with PCP. ?Plan to: Continue medication therapy ?HC Follow up: 4 months ?Pharmacist Follow up: 8 months ? ?Allergies ?Assess this condition today?: Yes ?Are your symptoms currently controlled?: Yes ?What are your allergy triggers?: Pollen / ragweed ?Do you currently receive allergy shots?: No ?What allergen avoidance measures are you taking?: Remaining indoors during elevated levels of allergen (i.e. pollen) ?Assessment:: Controlled ?Drug: Montelukast '10mg'$  1 tab at bedtime ?Assessment: Appropriate, Effective, Safe, Accessible ?Drug: Cetirizine '10mg'$  1 tab daily ?Assessment: Appropriate, Effective, Safe, Accessible ?Drug: Fluticasone 55mg 1 spray both nostrils daily ?Assessment: Appropriate, Effective, Safe, Accessible ?Plan to: Continue medication therapy ?HC Follow up: 4 months ?Pharmacist Follow up: 8 months ? ?Exercise, Diet and  Non-Drug Coordination Needs ?Additional exercise counseling points. We discussed: targeting at least 151 minutes per week of moderate-intensity aerobic exercise. ?Discussed Non-Drug Care Coordination Needs: Yes ? ?Accountable Health Communities Health-Related Social Needs Screening Tool -  ?SDOH  ?What is your living situation today? (ref #1): I have a steady place to live ?Think about the place you live. Do you have problems with any of the following? (ref #2): None of the above ?Within the past 12 months, you worried that your food would run out before you got money to buy more (ref #3): Never true ?Within the past 12 months, the food you bought just didn't last and you didn't have money to get more (ref #4): Never true ?In the past 12 months, has lack of reliable transportation kept you from medical appointments, meetings, work or from getting things needed for daily living? (ref #5): No ?In the past 12 months, has the electric, gas, oil, or water company threatened to shut off services in your home? (ref #6): No ?How often does anyone, including family and friends, physically hurt you? (ref #7): Never (1) ?How often does anyone, including family and friends, insult or talk down to you? (ref #8): Never (1) ?How often does anyone, including friends and family, threaten you with harm? (ref #9): Never (1) ?How often does anyone, including family and friends, scream or curse at you? (ref #10): Never (1) ? ? ? ? ? ?Engagement Notes ?HC Chart Review: 10 min 03/13/21 ?[facility] time spent: 10 min 03/13/21 ?CP Chart Prep: 11 min 03/13/21 ?CP Office Visit: 14 min 03/15/21 ?CP Office Visit Documentation: 42 min 03/15/21 ? ?AAlena Bills?Clinical Pharmacist ? ?

## 2021-03-17 ENCOUNTER — Telehealth: Payer: Self-pay | Admitting: Student-PharmD

## 2021-03-17 NOTE — Progress Notes (Signed)
?  Chronic Care Management ?Pharmacy Assistant  ? ?Name: Cassidy Parsons  MRN: [id number] DOB: 1945-11-16 ? ?Reason for Encounter: Care Plan and Patient Handout ? ?Medications: ?Outpatient Encounter Medications as of 03/17/2021  ?Medication Sig  ? aspirin EC 81 MG tablet Take 81 mg by mouth daily.  ? atorvastatin (LIPITOR) 10 MG tablet Take  1 tab po twice a week  ? azelastine (ASTELIN) 0.1 % nasal spray PLACE 1 SPRAY INTO EACH NOSTRIL DAILY ASDIRECTED.  ? Calcium Carb-Cholecalciferol 600-200 MG-UNIT TABS Take 1 tablet by mouth daily.  ? cetirizine (ZYRTEC) 10 MG tablet Take 10 mg by mouth daily.  ? esomeprazole (NEXIUM) 40 MG capsule Take 40 mg by mouth daily at 12 noon.  ? fluocinonide (LIDEX) 0.05 % external solution Apply 1 application topically 2 (two) times daily.  ? fluticasone (FLONASE) 50 MCG/ACT nasal spray Place 1 spray into both nostrils daily.  ? ketoconazole (NIZORAL) 2 % shampoo Apply 1 application topically 2 (two) times a week.  ? montelukast (SINGULAIR) 10 MG tablet TAKE (1) TABLET BY MOUTH DAILY AT BEDTIME  ? Multiple Vitamins-Minerals (MULTIVITAMIN ADULT PO) Take 1 tablet by mouth daily.  ? oxybutynin (DITROPAN) 5 MG tablet TAKE (1) TABLET BY MOUTH EVERY DAY  ? potassium citrate (UROCIT-K) 10 MEQ (1080 MG) SR tablet Take 1 tablet by mouth twice daily  ? Semaglutide (RYBELSUS) 7 MG TABS Take 7 mg by mouth daily.  ? ?No facility-administered encounter medications on file as of 03/17/2021.  ? ? ?Reviewed the patients visit reinsured it was completed per the pharmacist Alena Bills request. Printed the CCM care plan. Mailed the patient CCM care plan and patient handout to their most recent address on file. ? ?Charlann Lange, RMA ?Healthcare Concierge  ?[phone] ? ?

## 2021-04-11 ENCOUNTER — Other Ambulatory Visit: Payer: Self-pay

## 2021-04-11 DIAGNOSIS — J4520 Mild intermittent asthma, uncomplicated: Secondary | ICD-10-CM

## 2021-04-11 MED ORDER — AZELASTINE HCL 0.1 % NA SOLN: NASAL | 3 refills | Status: DC

## 2021-04-11 MED ORDER — FLUTICASONE PROPIONATE 50 MCG/ACT NA SUSP: 1.0000 | Freq: Every day | NASAL | 3 refills | Status: DC

## 2021-04-11 NOTE — Telephone Encounter (Signed)
Pt dropped off Patient Assistance application for RYBELSUS from [employer] and also papers for her insurance for cost tier reduction on some of her medications ?

## 2021-04-21 ENCOUNTER — Telehealth: Payer: Self-pay

## 2021-04-21 NOTE — Telephone Encounter (Signed)
[place] Patient Assistance form to [place].  I gave Alyssa the other patient asst forms to go to pts insurance to help TIER EXCEPTION.   ?

## 2021-04-24 ENCOUNTER — Ambulatory Visit (INDEPENDENT_AMBULATORY_CARE_PROVIDER_SITE_OTHER): Payer: Medicare HMO | Admitting: Physician Assistant

## 2021-04-24 ENCOUNTER — Encounter: Payer: Self-pay | Admitting: Physician Assistant

## 2021-04-24 VITALS — BP 148/84 | HR 98 | Temp 97.6°F | Resp 16 | Ht 61.0 in | Wt 235.8 lb

## 2021-04-24 DIAGNOSIS — R030 Elevated blood-pressure reading, without diagnosis of hypertension: Secondary | ICD-10-CM

## 2021-04-24 DIAGNOSIS — R300 Dysuria: Secondary | ICD-10-CM | POA: Diagnosis not present

## 2021-04-24 LAB — POCT URINALYSIS DIPSTICK
Glucose, UA: NEGATIVE
Nitrite, UA: POSITIVE
Protein, UA: POSITIVE — AB
Spec Grav, UA: 1.025 (ref 1.010–1.025)
Urobilinogen, UA: 0.2 E.U./dL
pH, UA: 5 (ref 5.0–8.0)

## 2021-04-24 MED ORDER — CIPROFLOXACIN HCL 500 MG PO TABS: 500.0000 mg | ORAL_TABLET | Freq: Two times a day (BID) | ORAL | 0 refills | Status: AC

## 2021-04-24 NOTE — Progress Notes (Signed)
?[facility] ?[address] ?[place], [place] [address] ? ?Internal MEDICINE  ?Office Visit Note ? ?Patient Name: Cassidy Parsons ? [id number]  ?[id number] ? ?Date of Service: 04/24/2021 ? ?Chief Complaint  ?Patient presents with  ? Urinary Tract Infection  ?  Lots of urine frequency + back pain - started Thursday evening  ? ? ? ?HPI ?Pt is here for a sick visit. ?-She is having pain in her back on right side, burning, urgency, cloudy urine ?-symptoms started on Thursday and have been worsening ?-She has had a Uti before and this feels the same ? ?Current Medication: ? ?Outpatient Encounter Medications as of 04/24/2021  ?Medication Sig  ? aspirin EC 81 MG tablet Take 81 mg by mouth daily.  ? atorvastatin (LIPITOR) 10 MG tablet Take  1 tab po twice a week  ? azelastine (ASTELIN) 0.1 % nasal spray PLACE 1 SPRAY INTO EACH NOSTRIL DAILY ASDIRECTED.  ? Calcium Carb-Cholecalciferol 600-200 MG-UNIT TABS Take 1 tablet by mouth daily.  ? cetirizine (ZYRTEC) 10 MG tablet Take 10 mg by mouth daily.  ? ciprofloxacin (CIPRO) 500 MG tablet Take 1 tablet (500 mg total) by mouth 2 (two) times daily for 7 days.  ? esomeprazole (NEXIUM) 40 MG capsule Take 40 mg by mouth daily at 12 noon.  ? fluocinonide (LIDEX) 0.05 % external solution Apply 1 application topically 2 (two) times daily.  ? fluticasone (FLONASE) 50 MCG/ACT nasal spray Place 1 spray into both nostrils daily.  ? ketoconazole (NIZORAL) 2 % shampoo Apply 1 application topically 2 (two) times a week.  ? montelukast (SINGULAIR) 10 MG tablet TAKE (1) TABLET BY MOUTH DAILY AT BEDTIME  ? Multiple Vitamins-Minerals (MULTIVITAMIN ADULT PO) Take 1 tablet by mouth daily.  ? oxybutynin (DITROPAN) 5 MG tablet TAKE (1) TABLET BY MOUTH EVERY DAY  ? potassium citrate (UROCIT-K) 10 MEQ (1080 MG) SR tablet Take 1 tablet by mouth twice daily  ? Semaglutide (RYBELSUS) 7 MG TABS Take 7 mg by mouth daily.  ? ?No facility-administered encounter medications on file as of 04/24/2021.   ? ? ? ? ?Medical History: ?Past Medical History:  ?Diagnosis Date  ? Asthma   ? Brain tumor (benign) ([place])   ? right side  ? Diabetes mellitus without complication ([place])   ? borderline  ? Early cataracts, bilateral   ? Environmental and seasonal allergies   ? GERD (gastroesophageal reflux disease)   ? Hypertension   ? PMH:  ? On home oxygen therapy   ? pt is no longer  using oxygen at night  ? Seizures ([place])   ? haven't had a seizure since brain surgery in 2001  ? Uterine cancer ([place])   ? ? ? ?Vital Signs: ?BP (!) 148/84   Pulse 98   Temp 97.6 ?F (36.4 ?C)   Resp 16   Ht '5\' 1"'$  (1.549 m)   Wt 235 lb 12.8 oz (107 kg)   SpO2 97%   BMI 44.55 kg/m?  ? ? ?Review of Systems  ?Constitutional:  Negative for fatigue and fever.  ?HENT:  Negative for congestion, mouth sores and postnasal drip.   ?Respiratory:  Negative for cough.   ?Cardiovascular:  Negative for chest pain.  ?Genitourinary:  Positive for dysuria, flank pain, frequency and urgency. Negative for hematuria.  ?Psychiatric/Behavioral: Negative.    ? ?Physical Exam ?Vitals reviewed.  ?Constitutional:   ?   General: She is not in acute distress. ?   Appearance: Normal appearance. She is obese. She  is not ill-appearing.  ?HENT:  ?   Head: Normocephalic and atraumatic.  ?Eyes:  ?   Pupils: Pupils are equal, round, and reactive to light.  ?Cardiovascular:  ?   Rate and Rhythm: Normal rate and regular rhythm.  ?Pulmonary:  ?   Effort: Pulmonary effort is normal. No respiratory distress.  ?Abdominal:  ?   Tenderness: There is right CVA tenderness. There is no left CVA tenderness.  ?Skin: ?   General: Skin is warm and dry.  ?Neurological:  ?   Mental Status: She is alert and oriented to person, place, and time.  ?   Cranial Nerves: No cranial nerve deficit.  ?   Coordination: Coordination normal.  ?   Gait: Gait normal.  ?Psychiatric:     ?   Mood and Affect: Mood normal.     ?   Behavior: Behavior normal.  ? ? ? ? ?Assessment/Plan: ?1. Dysuria ?Will start on cipro  and adjust based on C/S ?- POCT Urinalysis Dipstick ?- CULTURE, URINE COMPREHENSIVE ?- ciprofloxacin (CIPRO) 500 MG tablet; Take 1 tablet (500 mg total) by mouth 2 (two) times daily for 7 days.  Dispense: 14 tablet; Refill: 0 ? ?2. White coat syndrome without diagnosis of hypertension ?Elevated in office, but normal at home when she checks. Also could be elevated due to pain. Has 4 week follow up and will be rechecked ? ? ? ?General Counseling: mionna advincula understanding of the findings of todays visit and agrees with plan of treatment. I have discussed any further diagnostic evaluation that may be needed or ordered today. We also reviewed her medications today. she has been encouraged to call the office with any questions or concerns that should arise related to todays visit. ? ? ? ?Counseling: ? ? ? ?Orders Placed This Encounter  ?Procedures  ? CULTURE, URINE COMPREHENSIVE  ? POCT Urinalysis Dipstick  ? ? ?Meds ordered this encounter  ?Medications  ? ciprofloxacin (CIPRO) 500 MG tablet  ?  Sig: Take 1 tablet (500 mg total) by mouth 2 (two) times daily for 7 days.  ?  Dispense:  14 tablet  ?  Refill:  0  ? ? ?Time spent:25 Minutes ?

## 2021-04-25 ENCOUNTER — Other Ambulatory Visit: Payer: Self-pay | Admitting: Nurse Practitioner

## 2021-04-27 LAB — CULTURE, URINE COMPREHENSIVE

## 2021-04-28 ENCOUNTER — Telehealth: Payer: Self-pay

## 2021-04-28 DIAGNOSIS — E876 Hypokalemia: Secondary | ICD-10-CM

## 2021-04-28 MED ORDER — ALLOPURINOL 100 MG PO TABS: 100.0000 mg | ORAL_TABLET | Freq: Every day | ORAL | 1 refills | Status: DC

## 2021-04-28 NOTE — Telephone Encounter (Signed)
Called pt's insurance and spoke with Romie Minus on 04/26/21 @ 430 pm to 520 pm.  Pt had brought in some tier exception forms for [place] to fill out on 8 of her medications  to help lower her cost of the co-pays.  Per Alyssa the papers were confusing and asked me to call the insurance.  When I spoke with Romie Minus and asked her about the tier exception papers she asked how the patient got them papers.  I informed her that I wasn't sure that the pt brought them into the office and asked for provider to complete them to help lower her cost of the medications she takes.  Romie Minus informed me that patients can't ask for tier exceptions on medications that the providers have to request.  I called pt's pharmacy and got prices for what the patient pays for co-pays ? ?MONTELUKAST SODIUM 10 MG: FEB, $9 FOR 90 DAYS TIER 1 DRUG ? ?OXYBUTYNIN 5 MG: FEB, $16.12 FOR 90 DAYS ? ?POTASSIUM CITRATE 10 MEQ: JAN, $104.65 FOR 90 DAYS ? ?ATORVASTATIN 10 MG: JAN, $9 FOR 90 DAYS (TIER 1 DRUG) ? ?AZELASTINE NASAL SPRAY 0.1%, April, $20 FOR 100 DAY SUPPLY ? ?FLUOCINONIDE SOLUTION 0.05%, JAN, $80.00 FOR 15 DAY SUPPLY ? ?FLUTICASONE NASAL SPRAY 50 MCG/ACT, April, $14.00 FOR 60 DAY SUPPLY ? ?KETOCONAZOLE SHAMPOO 2%, JAN, $7.00 FOR 15 DAY SUPPLY ? ?Romie Minus from [facility] completed the tier request for the medications patient was needing help with.  On the MONTELUKAST AND ATORVASTATIN she advised that they were TIER 1 medications .   ?Received answers back on the request for Tier exception and [employer] denied for OXYBUTYNIN AND KETOCONAZOLE.  They advised for the POTASSIUM CITRATE ER that there is alternatives on the pt's insurance plan that she can change to.  Per Alyssa she will changed the potassium to something of her choice.   ? ?[employer] APPROVED the tier exception on medications FLUTICASONE PROPIONATE SUSPENSION down to a TIER 1 and coverage is approved from 01/08/2021 to 01/07/2022   ?Also approved AZELASTINE SOLUTION down to a TIER 1 and coverage is approved from 01/08/21  to 01/07/22 ? ?Patient was notified of the information and patient agreed to change from [facility] to ALLOPURINOL 100 MG ONCE A DAY for gout.  Sent in new prescription per ALYSSA and called pharmacy to ask/notify them of the TIER exception changes ?

## 2021-05-02 ENCOUNTER — Telehealth: Payer: Self-pay

## 2021-05-03 ENCOUNTER — Other Ambulatory Visit: Payer: Self-pay

## 2021-05-03 MED ORDER — CLOBETASOL PROPIONATE 0.05 % EX SOLN
1.0000 | Freq: Two times a day (BID) | CUTANEOUS | 3 refills | Status: DC
Start: 2021-05-03 — End: 2021-05-04

## 2021-05-03 NOTE — Telephone Encounter (Signed)
Called pharmacy and prescribed the CLOBETASOL TOPICAL SOLUTION and I spoke to Seth Bake at pharmacy, she ran the medication and for 20 days supply it will cost patient $50.05.  will check with patient and see if she is ok with the price and changing from the past medication FLUOCINONIDE ?

## 2021-05-04 ENCOUNTER — Other Ambulatory Visit: Payer: Self-pay

## 2021-05-04 MED ORDER — CLOBETASOL PROPIONATE 0.05 % EX SOLN: 1.0000 "application " | Freq: Two times a day (BID) | CUTANEOUS | 3 refills | Status: DC

## 2021-05-04 NOTE — Telephone Encounter (Signed)
Spoke to patient and informed her that Cassidy Parsons changed her FLUOCINONIDE (lidex) to CLOBETASOL 0.05% TOPICAL SOLUTION.   ?At [facility] they filed with insurance and for a 25 ml bottle price was $50.05.  pt wasn't real happy with the price but better that what she paid before ($80.00).  I went on [place] and looked up the medication and saw that without insurance the patient could get the medication for a 50 ml bottle for $19.44.  pt asked for [place] to [facility] the [facility].   ? ?I also discussed with the patient that she can go through and check with [facility]  on all her medications and see if she can get them cheaper. Advised patient she can call us back and let me know if she has other medications she needs me to send to either [facility] or another pharmacy.  ?

## 2021-05-15 ENCOUNTER — Other Ambulatory Visit: Payer: Self-pay | Admitting: Nurse Practitioner

## 2021-05-16 ENCOUNTER — Ambulatory Visit: Payer: Medicare HMO | Admitting: Radiation Oncology

## 2021-05-18 ENCOUNTER — Encounter: Payer: Self-pay | Admitting: Radiation Oncology

## 2021-05-18 ENCOUNTER — Ambulatory Visit
Admission: RE | Admit: 2021-05-18 | Discharge: 2021-05-18 | Disposition: A | Discharge status: Home / Self Care | Payer: Medicare HMO | Source: Ambulatory Visit | Attending: Radiation Oncology | Admitting: Radiation Oncology

## 2021-05-18 VITALS — BP 156/73 | HR 91 | Temp 97.8°F | Resp 20 | Wt 233.6 lb

## 2021-05-18 DIAGNOSIS — Z08 Encounter for follow-up examination after completed treatment for malignant neoplasm: Secondary | ICD-10-CM | POA: Diagnosis not present

## 2021-05-18 DIAGNOSIS — R918 Other nonspecific abnormal finding of lung field: Secondary | ICD-10-CM | POA: Insufficient documentation

## 2021-05-18 DIAGNOSIS — C541 Malignant neoplasm of endometrium: Secondary | ICD-10-CM | POA: Diagnosis not present

## 2021-05-18 NOTE — Progress Notes (Signed)
Radiation Oncology ?Follow up Note ? ?Name: Cassidy Parsons   ?Date:   05/18/2021 ?MRN:  [id number] ?DOB: 1945-06-22  ? ? ?This 76 y.o. 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 ? ?REFERRING PROVIDER: KLavera Guise MD ? ?HPI: Patient is a 76year old female now at 18 months having completed whole pelvic radiation therapy status post TLH BSO for mixed clear-cell endometrial carcinoma..  Clinically she is doing well specifically denies any increased lower urinary tract symptoms diarrhea or fatigue.  She has had both CT scans and PET CT scans.  There was a 2.4 cm nodule most likely a lymph node which was PET negative near the IVC.  She underwent a biopsy which was negative with no lymphatic tissue was present.  Her pulmonary nodules have been followed on CT and are stable.  She has a follow-up CT scan the end of this month and is to see Dr. BFransisca Connors ? ?COMPLICATIONS OF TREATMENT: none ? ?FOLLOW UP COMPLIANCE: keeps appointments  ? ?PHYSICAL EXAM:  ?BP (!) 156/73   Pulse 91   Temp 97.8 ?F (36.6 ?C)   Resp 20   Wt 233 lb 9.6 oz (106 kg)   SpO2 98%   BMI 44.14 kg/m?  ?Morbidly obese female in NAD.  Well-developed well-nourished patient in NAD. HEENT reveals PERLA, EOMI, discs not visualized.  Oral cavity is clear. No oral mucosal lesions are identified. Neck is clear without evidence of cervical or supraclavicular adenopathy. Lungs are clear to A&P. Cardiac examination is essentially unremarkable with regular rate and rhythm without murmur rub or thrill. Abdomen is benign with no organomegaly or masses noted. Motor sensory and DTR levels are equal and symmetric in the upper and lower extremities. Cranial nerves II through XII are grossly intact. Proprioception is intact. No peripheral adenopathy or edema is identified. No motor or sensory levels are noted. Crude visual fields are within  normal range. ? ?RADIOLOGY RESULTS: CT scans and PET CT scans reviewed ? ?PLAN: On my review PET CT scan looks within normal limits with no areas of significant concern at this time.  The nodes are following has been attempted biopsy and is PET negative on my review.  She has a follow-up the end of this month with Dr. BFransisca Connorsas well as a CT scan which I will evaluate but when it becomes available.  Have otherwise asked to see her back in 6 months for follow-up.  Patient comprehends my recommendations well. ? ?I would like to take this opportunity to thank you for allowing me to participate in the care of your patient.. ?  ? GNoreene Filbert MD ? ?

## 2021-05-24 ENCOUNTER — Encounter: Payer: Self-pay | Admitting: Nurse Practitioner

## 2021-05-24 ENCOUNTER — Ambulatory Visit (INDEPENDENT_AMBULATORY_CARE_PROVIDER_SITE_OTHER): Payer: Medicare HMO | Admitting: Nurse Practitioner

## 2021-05-24 VITALS — BP 130/70 | HR 67 | Temp 98.3°F | Resp 16 | Ht 61.0 in | Wt 235.4 lb

## 2021-05-24 DIAGNOSIS — Z6841 Body Mass Index (BMI) 40.0 and over, adult: Secondary | ICD-10-CM

## 2021-05-24 DIAGNOSIS — Z1231 Encounter for screening mammogram for malignant neoplasm of breast: Secondary | ICD-10-CM

## 2021-05-24 DIAGNOSIS — E1165 Type 2 diabetes mellitus with hyperglycemia: Secondary | ICD-10-CM

## 2021-05-24 DIAGNOSIS — E782 Mixed hyperlipidemia: Secondary | ICD-10-CM | POA: Diagnosis not present

## 2021-05-24 DIAGNOSIS — R030 Elevated blood-pressure reading, without diagnosis of hypertension: Secondary | ICD-10-CM | POA: Diagnosis not present

## 2021-05-24 LAB — POCT GLYCOSYLATED HEMOGLOBIN (HGB A1C): Hemoglobin A1C: 7.2 % — AB (ref 4.0–5.6)

## 2021-05-24 MED ORDER — RYBELSUS 7 MG PO TABS: 7.0000 mg | ORAL_TABLET | Freq: Every day | ORAL | 1 refills | Status: DC

## 2021-05-24 MED ORDER — ATORVASTATIN CALCIUM 10 MG PO TABS: ORAL_TABLET | ORAL | 1 refills | Status: DC

## 2021-05-24 NOTE — Progress Notes (Signed)
[facility] [place], [place] [address]  Internal MEDICINE  Office Visit Note  Patient Name: Cassidy Parsons  [id number]  [id number]  Date of Service: 05/24/2021  Chief Complaint  Patient presents with   Follow-up   Diabetes   Gastroesophageal Reflux   Hypertension   Asthma    HPI Cassidy Parsons presents for a follow-up visit for hypertension, diabetes and weight loss management.  She has seen her oncologist and had a updated PET scan done and it was good she has her mammogram ordered she just needs to schedule it She has been traveling recently so her diet has not been ideal and she has an upcoming wedding and a memorial for her stepdaughter's death but overall she has been working on her diet and she has been trying to do some form of exercise regularly. Her blood pressure was elevated initially but improved when rechecked, see vitals. She needs refills of atorvastatin and Rybelsus.   Current Medication: Outpatient Encounter Medications as of 05/24/2021  Medication Sig   allopurinol (ZYLOPRIM) 100 MG tablet Take 1 tablet (100 mg total) by mouth daily.   aspirin EC 81 MG tablet Take 81 mg by mouth daily.   azelastine (ASTELIN) 0.1 % nasal spray PLACE 1 SPRAY INTO EACH NOSTRIL DAILY ASDIRECTED.   Calcium Carb-Cholecalciferol 600-200 MG-UNIT TABS Take 1 tablet by mouth daily.   cetirizine (ZYRTEC) 10 MG tablet Take 10 mg by mouth daily.   clobetasol (TEMOVATE) 0.05 % external solution Apply 1 application. topically 2 (two) times daily.   esomeprazole (NEXIUM) 40 MG capsule Take 40 mg by mouth daily at 12 noon.   fluocinonide (LIDEX) 0.05 % external solution Apply 1 application topically 2 (two) times daily.   fluticasone (FLONASE) 50 MCG/ACT nasal spray Place 1 spray into both nostrils daily.   montelukast (SINGULAIR) 10 MG tablet TAKE (1) TABLET BY MOUTH DAILY AT BEDTIME   Multiple Vitamins-Minerals (MULTIVITAMIN ADULT PO) Take 1 tablet by mouth daily.    oxybutynin (DITROPAN) 5 MG tablet TAKE (1) TABLET BY MOUTH EVERY DAY   [DISCONTINUED] atorvastatin (LIPITOR) 10 MG tablet Take  1 tab po twice a week   [DISCONTINUED] ketoconazole (NIZORAL) 2 % shampoo Apply 1 application topically 2 (two) times a week.   [DISCONTINUED] Semaglutide (RYBELSUS) 7 MG TABS Take 7 mg by mouth daily.   atorvastatin (LIPITOR) 10 MG tablet Take  1 tab po twice a week   Semaglutide (RYBELSUS) 7 MG TABS Take 7 mg by mouth daily.   No facility-administered encounter medications on file as of 05/24/2021.    Surgical History: Past Surgical History:  Procedure Laterality Date   BRAIN SURGERY     BREAST BIOPSY     BREAST SURGERY     biopsy right breast   CRANIOTOMY N/A 02/10/2015   Procedure: Removal of cranial plate with scalp wound revision;  Surgeon: Earnie Larsson, MD;  Location: [facility];  Service: Neurosurgery;  Laterality: N/A;   DIAGNOSTIC LAPAROSCOPY     panniculectomy   PARTIAL HYSTERECTOMY     TONSILLECTOMY      Medical History: Past Medical History:  Diagnosis Date   Asthma    Brain tumor (benign) ([place])    right side   Diabetes mellitus without complication ([place])    borderline   Early cataracts, bilateral    Environmental and seasonal allergies    GERD (gastroesophageal reflux disease)    Hypertension    PMH:   On home oxygen therapy  pt is no longer  using oxygen at night   Seizures ([facility])    haven't had a seizure since brain surgery in 2001   Uterine cancer [facility])     Family History: Family History  Problem Relation Age of Onset   Cancer Mother    Diabetes Father    Heart failure Father    Depression Sister    Stroke Sister    Depression Sister    Breast cancer Maternal Aunt    Cancer Brother    Cancer Brother    Cancer Brother    Diabetes Other     Social History   Socioeconomic History   Marital status: Widowed    Spouse name: Not on file   Number of children: Not on file   Years of education: Not on file   Highest  education level: Not on file  Occupational History   Not on file  Tobacco Use   Smoking status: Former    Types: Cigarettes   Smokeless tobacco: Never   Tobacco comments:    quit smoking cigarettes 34 years ago  Vaping Use   Vaping Use: Never used  Substance and Sexual Activity   Alcohol use: No   Drug use: No   Sexual activity: Not on file  Other Topics Concern   Not on file  Social History Narrative   Not on file   Social Determinants of Health   Financial Resource Strain: Low Risk  (03/15/2021)   Overall Financial Resource Strain (CARDIA)    Difficulty of Paying Living Expenses: Not hard at all  Food Insecurity: No Food Insecurity (03/15/2021)   Hunger Vital Sign    Worried About Running Out of Food in the Last Year: Never true    [place] in the Last Year: Never true  Transportation Needs: No Transportation Needs (03/15/2021)   PRAPARE - Hydrologist (Medical): No    Lack of Transportation (Non-Medical): No  Physical Activity: Not on file  Stress: Not on file  Social Connections: Not on file  Intimate Partner Violence: Not on file      Review of Systems  Constitutional:  Negative for chills, fatigue and unexpected weight change.  HENT:  Negative for congestion, rhinorrhea, sneezing and sore throat.   Eyes:  Negative for redness.  Respiratory:  Negative for cough, chest tightness and shortness of breath.   Cardiovascular:  Negative for chest pain and palpitations.  Gastrointestinal:  Negative for abdominal pain, constipation, diarrhea, nausea and vomiting.  Genitourinary:  Negative for dysuria and frequency.  Musculoskeletal:  Negative for arthralgias, back pain, joint swelling and neck pain.  Skin:  Negative for rash.  Neurological: Negative.  Negative for tremors and numbness.  Hematological:  Negative for adenopathy. Does not bruise/bleed easily.  Psychiatric/Behavioral:  Negative for behavioral problems (Depression), sleep  disturbance and suicidal ideas. The patient is not nervous/anxious.     Vital Signs: BP 130/70 Comment: 151/86  Pulse 67   Temp 98.3 F (36.8 C)   Resp 16   Ht '5\' 1"'$  (1.549 m)   Wt 235 lb 6.4 oz (106.8 kg)   SpO2 94%   BMI 44.48 kg/m    Physical Exam Vitals reviewed.  Constitutional:      General: She is not in acute distress.    Appearance: Normal appearance. She is obese. She is not ill-appearing.  HENT:     Head: Normocephalic and atraumatic.  Eyes:     Pupils: Pupils  are equal, round, and reactive to light.  Cardiovascular:     Rate and Rhythm: Normal rate and regular rhythm.  Pulmonary:     Effort: Pulmonary effort is normal. No respiratory distress.  Neurological:     Mental Status: She is alert and oriented to person, place, and time.  Psychiatric:        Mood and Affect: Mood normal.        Behavior: Behavior normal.        Assessment/Plan: 1. Type 2 diabetes mellitus with hyperglycemia, without long-term current use of insulin (HCC) Her A1c increased to 7.2 compared to 6.7 in February this year.  The patient is working on her diet and trying to do exercise 3 to 5 days a week if possible.  Medication refill sent to pharmacy, repeat A1c in 3 months - POCT HgB A1C - Semaglutide (RYBELSUS) 7 MG TABS; Take 7 mg by mouth daily.  Dispense: 90 tablet; Refill: 1 - atorvastatin (LIPITOR) 10 MG tablet; Take  1 tab po twice a week  Dispense: 90 tablet; Refill: 1  2. White coat syndrome without diagnosis of hypertension Blood pressure was elevated initially but improved to a stable number when rechecked, see vitals.  She is not currently on any medication for elevated blood pressure.  We will continue to monitor.  She does report that her blood pressures are normal at home  3. Morbid obesity with BMI of 40.0-44.9, adult ([place]) Continue previously discussed diet modifications and lifestyle modifications, will follow-up in 3 months  4. Mixed hyperlipidemia Continue  statin therapy, refills sent to pharmacy. - atorvastatin (LIPITOR) 10 MG tablet; Take  1 tab po twice a week  Dispense: 90 tablet; Refill: 1   General Counseling: Cassidy Parsons verbalizes understanding of the findings of todays visit and agrees with plan of treatment. I have discussed any further diagnostic evaluation that may be needed or ordered today. We also reviewed her medications today. she has been encouraged to call the office with any questions or concerns that should arise related to todays visit.    Orders Placed This Encounter  Procedures   POCT HgB A1C    Meds ordered this encounter  Medications   Semaglutide (RYBELSUS) 7 MG TABS    Sig: Take 7 mg by mouth daily.    Dispense:  90 tablet    Refill:  1    For future refills   atorvastatin (LIPITOR) 10 MG tablet    Sig: Take  1 tab po twice a week    Dispense:  90 tablet    Refill:  1    For future refills    Return in about 3 months (around 08/24/2021) for F/U, Recheck A1C, Martin Smeal PCP.   Total time spent:30 Minutes Time spent includes review of chart, medications, test results, and follow up plan with the patient.   [place] Controlled Substance Database was reviewed by me.  This patient was seen by Jonetta Osgood, FNP-C in collaboration with Dr. Clayborn Bigness as a part of collaborative care agreement.   Lewis Grivas R. Valetta Fuller, MSN, FNP-C Internal medicine

## 2021-05-31 ENCOUNTER — Other Ambulatory Visit: Payer: Self-pay | Admitting: Nurse Practitioner

## 2021-05-31 DIAGNOSIS — Z1231 Encounter for screening mammogram for malignant neoplasm of breast: Secondary | ICD-10-CM | POA: Diagnosis not present

## 2021-06-02 ENCOUNTER — Ambulatory Visit
Admission: RE | Admit: 2021-06-02 | Discharge: 2021-06-02 | Disposition: A | Discharge status: Home / Self Care | Payer: Medicare HMO | Source: Ambulatory Visit | Attending: Obstetrics and Gynecology | Admitting: Obstetrics and Gynecology

## 2021-06-02 ENCOUNTER — Inpatient Hospital Stay: Admission: RE | Admit: 2021-06-02 | Payer: Medicare HMO | Source: Ambulatory Visit

## 2021-06-02 DIAGNOSIS — C541 Malignant neoplasm of endometrium: Secondary | ICD-10-CM | POA: Insufficient documentation

## 2021-06-02 DIAGNOSIS — K6389 Other specified diseases of intestine: Secondary | ICD-10-CM | POA: Diagnosis not present

## 2021-06-02 DIAGNOSIS — K5730 Diverticulosis of large intestine without perforation or abscess without bleeding: Secondary | ICD-10-CM | POA: Diagnosis not present

## 2021-06-02 DIAGNOSIS — C539 Malignant neoplasm of cervix uteri, unspecified: Secondary | ICD-10-CM | POA: Diagnosis not present

## 2021-06-02 DIAGNOSIS — K8020 Calculus of gallbladder without cholecystitis without obstruction: Secondary | ICD-10-CM | POA: Diagnosis not present

## 2021-06-02 LAB — POCT I-STAT CREATININE: Creatinine, Ser: 0.8 mg/dL (ref 0.44–1.00)

## 2021-06-02 MED ORDER — IOHEXOL 300 MG/ML  SOLN
100.0000 mL | Freq: Once | INTRAMUSCULAR | Status: AC | PRN
  Administered 2021-06-02: 100 mL via INTRAVENOUS

## 2021-06-07 ENCOUNTER — Inpatient Hospital Stay: Payer: Medicare HMO | Attending: Obstetrics and Gynecology | Admitting: Nurse Practitioner

## 2021-06-07 VITALS — BP 157/82 | HR 79 | Temp 98.7°F | Resp 20 | Wt 233.3 lb

## 2021-06-07 DIAGNOSIS — B372 Candidiasis of skin and nail: Secondary | ICD-10-CM | POA: Diagnosis not present

## 2021-06-07 DIAGNOSIS — R69 Illness, unspecified: Secondary | ICD-10-CM | POA: Diagnosis not present

## 2021-06-07 DIAGNOSIS — F419 Anxiety disorder, unspecified: Secondary | ICD-10-CM | POA: Insufficient documentation

## 2021-06-07 DIAGNOSIS — E119 Type 2 diabetes mellitus without complications: Secondary | ICD-10-CM | POA: Insufficient documentation

## 2021-06-07 DIAGNOSIS — Z6841 Body Mass Index (BMI) 40.0 and over, adult: Secondary | ICD-10-CM | POA: Diagnosis not present

## 2021-06-07 DIAGNOSIS — I10 Essential (primary) hypertension: Secondary | ICD-10-CM | POA: Diagnosis not present

## 2021-06-07 DIAGNOSIS — R918 Other nonspecific abnormal finding of lung field: Secondary | ICD-10-CM | POA: Insufficient documentation

## 2021-06-07 DIAGNOSIS — Z87891 Personal history of nicotine dependence: Secondary | ICD-10-CM | POA: Diagnosis not present

## 2021-06-07 DIAGNOSIS — C541 Malignant neoplasm of endometrium: Secondary | ICD-10-CM | POA: Diagnosis not present

## 2021-06-07 DIAGNOSIS — Z809 Family history of malignant neoplasm, unspecified: Secondary | ICD-10-CM | POA: Diagnosis not present

## 2021-06-07 MED ORDER — NYSTATIN 100000 UNIT/GM EX POWD: 1.0000 "application " | Freq: Three times a day (TID) | CUTANEOUS | 0 refills | Status: DC | PRN

## 2021-06-07 NOTE — Progress Notes (Signed)
Gynecologic Oncology Interval Visit   Referring Provider: Dr. Kenton Kingfisher  Chief Complaint: 1A mixed clear cell/endometrioid endometrial carcinoma the endometrium status post TLH/BSO, radiation  Subjective:  Cassidy Parsons is a 76 y.o. 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 Initial pathology from endometrial biopsy was reviewed at [facility] and confirmed clear cell carcinoma. She returns to clinic for discussion of imaging results and continued surveillance.   Patient was previously noted to have possible vaginal cuff mass. This was followed up with CT in September 2022 that was negative except for 2.4 cm nodule thought to represent lymph node and be above field of RT, near IVC. This was followed up with CT in December 2022 which showed increase in size to 2.6 cm. Biopsy was performed which was negative however, limited sample. PET scan was recommended which showed soft tissue stranding in right retroperitoneum with mild FDG activity. The retroperitoneal LN was obscured and avidity could not be characterized. No other metastatic disease was noted. Incidental chronic sacral insufficiency fractures. Follow up CT was recommended.   06/02/21- CT Abdomen Pelvis W Contrast - previously seen enlarged right retroperitoneal lymph node is not present on today's exam. No other enlarged abdominal or pelvic lymph nodes. No evidence of metastatic disease in the abdomen or pelvis.   She had mammogram at [facility] performed on 05/31/21 which showed some abnormality of right breast and she will return for diagnostic imaging. She is not interested in having another colonoscopy, last approximately 9 years ago. Denies bleeding, pain, discharge. She is traveling to [place] this weekend for her grandson's wedding. She is going to [place] later this month for celebration of  life for her step-daughter.     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  Patient had ~ 5 year history of PMB, worse since May 2021 after she fell on her stomach. On 22-Aug-2022 underwent endometrial biopsy. - Clear cell carcinoma, FIGO grade 3  Pap August 22, 2019- NILM  Husband died in Apr 05, 2022 and she delayed self care because she was taking care of him.   CT scan C/A/P 08/07/19 did not show metastatic disease. Impression:  1. Heterogeneous and thickened appearance of the endometrium in keeping with patients known primary malignancy.  2. No definite evidence of metastatic disease in the chest, abdomen, or pelvis.  3. Bilateral pulmonary nodules, nonspecific. Recommend continued attention on follow-up.  4. Mild thickening of the sigmoid colon and tethering of the superior bladder wall, favored sequela of prior diverticulitis.   Underwent TLH, BSx/RO SLN mapping at [facility] 08/11/19.  Grade 2 endometrioid 4.2 cm cancer with 26% invasion.  Negative right adnexa and cervix and no LVSI.  Left adnexa could not be removed due to dense adhesions of sigmoid colon secondary to severe diverticular disease.   Comment: The tumor demonstrates foci of well-formed endometrioid glands with defined nuclear polarity, but a considerable portion of the tumor appears solid, with large areas of squamous differentiation.  Morphologic features of clear cell carcinoma, such as marked nuclear atypia, hobnail architecture, hyalinized stroma, and intracytoplasmic eosinophilic globules, are not identified.  Secretory change is present in some cells, which stain positive for the markers Napsin A and AMACR.  The reported history of a prior biopsy with clear cell carcinoma is noted.  Review of  this biopsy may be beneficial to exclude the possibility of a mixed clear cell/endometrioid tumor, if this diagnosis would  influence patient management.  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   Slides were reviewed at [facility]. Dr. Fransisca Connors recommended adjuvant WPRT.   10/20/19-11/23/19 completed WPRT. Suffered diarrhea and fatigue during treatment and she has a h/o urinary frequency unrelieved by oxybutynin.   History of pulmonary nodules:  Previous imaging showed bilateral pulmonary nodules, 2-3 mm nonspecific on CT scan. Per patient, results were reassuring. [facility] radiology regarding read on 07/18/20 CT scan. No further follow up needed given stability.   09/19/2020 CT ABDOMEN AND PELVIS WITH CONTRAST  Soft tissue nodule at the right aspect of the aorta, anterior to the right ureter, measuring up to 10 x 8 x 24 mm (series 2, image 49 and series 6, image 118), which does not fill with contrast on the delayed sequence.   12/26/20 CT ABDOMEN AND PELVIS WITH CONTRAST Enlarged visualized portions of great saphenous veins with multiple associated distended anterior pelvic wall veins. 2.6 x 1.2 cm enlarged right retroperitoneal nodule or lymph node anterior to the mid ureter (5:59 and 2:51), previously 2.4 x 1. No new adenopathy.     Problem List: Patient Active Problem List   Diagnosis Date Noted   Primary clear cell adenocarcinoma of endometrium ([place]) 12/23/2019   Morbid obesity with BMI of 45.0-49.9, adult ([place]) 08/12/2019   Urinary tract infection without hematuria 08/07/2018   Dysuria 08/07/2018   Osteoarthritis of knee 04/11/2017   Low back pain 02/05/2017   Mild persistent asthma, uncomplicated 30/16/0109   Rhinitis, allergic 02/05/2017   GAD (generalized anxiety disorder) 02/05/2017   Unspecified complication of internal orthopedic prosthetic device, implant and graft, initial encounter ([place]) 02/05/2017   Diabetes mellitus without complication ([place]) 32/35/5732   Gout, unspecified 02/05/2017   Occlusion and stenosis of bilateral carotid arteries 02/05/2017   Insomnia,  unspecified 02/05/2017   Sleep apnea 02/05/2017   Elevated blood-pressure reading without diagnosis of hypertension 02/05/2017   Dupuytren's disease of palm 10/05/2016   Exposed orthopaedic hardware ([place]) 02/10/2015    Past Medical History: Past Medical History:  Diagnosis Date   Asthma    Brain tumor (benign) ([place])    right side   Diabetes mellitus without complication ([place])    borderline   Early cataracts, bilateral    Environmental and seasonal allergies    GERD (gastroesophageal reflux disease)    Hypertension    PMH:   On home oxygen therapy    pt is no longer  using oxygen at night   Seizures ([place])    haven't had a seizure since brain surgery in 2001   Uterine cancer [facility])     Past Surgical History: Past Surgical History:  Procedure Laterality Date   BRAIN SURGERY     BREAST SURGERY     biopsy right breast   CRANIOTOMY N/A 02/10/2015   Procedure: Removal of cranial plate with scalp wound revision;  Surgeon: Earnie Larsson, MD;  Location: [facility];  Service: Neurosurgery;  Laterality: N/A;   DIAGNOSTIC LAPAROSCOPY     panniculectomy   PARTIAL HYSTERECTOMY     TONSILLECTOMY      Past Gynecologic History:  Menarche: age [age] Post menopausal Hx of post menopausal bleeding    OB History:  OB History  Gravida Para Term Preterm AB Living  $Remov'2 1 1   1 1  'GooXHK$ SAB IAB Ectopic Multiple Live Births               #  Outcome Date GA Lbr Len/2nd Weight Sex Delivery Anes PTL Lv  2 AB           1 Term             Family History: Family History  Problem Relation Age of Onset   Cancer Mother    Diabetes Father    Heart failure Father    Depression Sister    Stroke Sister    Depression Sister    Cancer Brother    Cancer Brother    Cancer Brother    Diabetes Other     Social History: Social History   Socioeconomic History   Marital status: Widowed    Spouse name: Not on file   Number of children: Not on file   Years of education: Not on file   Highest  education level: Not on file  Occupational History   Not on file  Tobacco Use   Smoking status: Former    Types: Cigarettes   Smokeless tobacco: Never   Tobacco comments:    quit smoking cigarettes 34 years ago  Vaping Use   Vaping Use: Never used  Substance and Sexual Activity   Alcohol use: No   Drug use: No   Sexual activity: Not on file  Other Topics Concern   Not on file  Social History Narrative   Not on file   Social Determinants of Health   Financial Resource Strain: Low Risk    Difficulty of Paying Living Expenses: Not hard at all  Food Insecurity: No Food Insecurity   Worried About Charity fundraiser in the Last Year: Never true   [place] in the Last Year: Never true  Transportation Needs: No Transportation Needs   Lack of Transportation (Medical): No   Lack of Transportation (Non-Medical): No  Physical Activity: Not on file  Stress: Not on file  Social Connections: Not on file  Intimate Partner Violence: Not on file   Immunization History  Administered Date(s) Administered   Influenza-Unspecified 10/22/2016, 09/21/2019, 10/10/2020   Moderna SARS-COV2 Booster Vaccination 12/15/2019, 11/04/2020   Moderna Sars-Covid-2 Vaccination 03/02/2019, 03/31/2019, 06/28/2020   Pneumococcal Conjugate-13 07/23/2015   Pneumococcal Polysaccharide-23 02/18/2018   Zoster, Live 10/16/2012   Zoster, Unspecified 10/16/2012   Allergies: Allergies  Allergen Reactions   Dust Mite Extract    Mold Extract  [Trichophyton]    Molds & Smuts Other (See Comments)   Pollen Extract    Roxanol [Morphine] Other (See Comments)    Extreme pain   Meperidine Rash    Itching   Meperidine And Related Itching and Rash   Other Itching, Rash and Other (See Comments)    All pain relievers except hydrocodone Trees, mold, dust, and perfume allergies (sneezing, eyes swell up)   Propoxyphene Itching and Rash   Tape Itching and Rash    With paper tape.  Please use adhesive tape Rash    Tramadol Itching and Rash    Current Medications: Current Outpatient Medications  Medication Sig Dispense Refill   allopurinol (ZYLOPRIM) 100 MG tablet Take 1 tablet (100 mg total) by mouth daily. 90 tablet 1   aspirin EC 81 MG tablet Take 81 mg by mouth daily.     atorvastatin (LIPITOR) 10 MG tablet Take  1 tab po twice a week 90 tablet 1   azelastine (ASTELIN) 0.1 % nasal spray PLACE 1 SPRAY INTO EACH NOSTRIL DAILY ASDIRECTED. 30 mL 3   Calcium Carb-Cholecalciferol 600-200 MG-UNIT TABS Take 1 tablet  by mouth daily.     cetirizine (ZYRTEC) 10 MG tablet Take 10 mg by mouth daily.     clobetasol (TEMOVATE) 0.05 % external solution Apply 1 application. topically 2 (two) times daily. 50 mL 3   esomeprazole (NEXIUM) 40 MG capsule Take 40 mg by mouth daily at 12 noon.     fluocinonide (LIDEX) 0.05 % external solution Apply 1 application topically 2 (two) times daily. 60 mL 1   fluticasone (FLONASE) 50 MCG/ACT nasal spray Place 1 spray into both nostrils daily. 16 g 3   ketoconazole (NIZORAL) 2 % shampoo APPLY 1 APPLICATION TOPICALLY TWICE A WEEK. 120 mL 3   montelukast (SINGULAIR) 10 MG tablet TAKE (1) TABLET BY MOUTH DAILY AT BEDTIME 90 tablet 1   Multiple Vitamins-Minerals (MULTIVITAMIN ADULT PO) Take 1 tablet by mouth daily.     oxybutynin (DITROPAN) 5 MG tablet TAKE (1) TABLET BY MOUTH EVERY DAY 90 tablet 0   Semaglutide (RYBELSUS) 7 MG TABS Take 7 mg by mouth daily. 90 tablet 1   No current facility-administered medications for this visit.   Review of Systems General:  no complaints Skin: no complaints Eyes: no complaints HEENT: no complaints Breasts: no complaints Pulmonary: no complaints Cardiac: no complaints Gastrointestinal: no complaints Genitourinary/Sexual: no complaints Ob/Gyn: no complaints Musculoskeletal: no complaints Hematology: no complaints Neurologic/Psych: no complaints   Objective:  Physical Examination:  There were no vitals taken for this visit.   ECOG  Performance Status: 1 - Symptomatic but completely ambulatory  GENERAL: Patient is a well appearing female in no acute distress HEENT:  Sclera clear. Anicteric NODES:  Negative axillary, supraclavicular, inguinal lymph node survery LUNGS:  Clear to auscultation bilaterally.   HEART:  Regular rate and rhythm.  ABDOMEN:  Soft, nontender.  No hernias, incisions well healed. No masses or ascites.  EXTREMITIES:  No peripheral edema. Atraumatic. No cyanosis SKIN:  Skin maceration at skin folds, worse in left inguinal skin fold d/t moisture.  NEURO:  Nonfocal. Well oriented.  Appropriate affect.  Pelvic: Exam chaperoned by CMA. EGBUS: no lesions. Vagina: no lesions, no discharge or bleeding. Mild agglutination. Mild thickening of tissue at vaginal cuff at midline, no definite masses, soft, nontender. Benign appearing. Cervix, Uterus- surgically absent. Adnexa: smooth, no masses palpated though exam limited d/t habitus. Rectovaginal: deferred.      Assessment:  CHARLCIE PRISCO is a 76 y.o. female diagnosed with 1A mixed clear cell/endometrioid endometrial carcinoma (MSI; dMMR with loss of MLH1/PMS2 with methylated MLH1 alleles present) s/p TLH, [facility] 08/11/19. SLN mapping unsuccessful and left adnexa could not be removed due to severe adhesions from diverticular disease.  No LVSI, or cervical/adnexal involvement and invades 23% of the myometrium. Pre-op CT scan negative for metastatic disease. Treated with whole pelvic radiation 11/21.  Possible vaginal cuff mass, CT 09/2020 showed 2.4 nodule thought to represent lymph node, near IVC, above field of RT. CT 12/2020 increased to 2.6 cm. Biopsy was negative though no lymphatic tissue present. PET scan showed stranding of right retroperitoneum, mildly FDG active. Retroperitoneal LN was obscured and could not be characterized. Follow up CT 06/02/21 was negative. She is asymptomatic and clinically NED. Recommend continued surveillance.   Pulmonary nodules, 07/2019  on CT chest, on follow up CT at [place] 2022 report stable. No additional imaging was recommended.   Depression and anxiety- improved.   Medical co-morbidities complicating care: HTN, morbid obesity (There is no height or weight on file to calculate BMI.), prediabetes, possible h/o carotid stenosis.  Plan:   Problem List Items Addressed This Visit   None  Given resolution of possible retroperitoneal lymph node, recommend continued surveillance and no additional imaging at this time unless concerning findings on exam or symptoms. Patient agreeable. Clinically, NED today. Recommend follow up/surveillance with pelvic exam in 4 months. If recurrent disease, she would be a candidate for chemotherapy or pembrolizumab/immunotherapy.   Intertrigous dermatitis- keep area cool, dry. Will refill topical nystatin to control candida component.   Verlon Au, NP

## 2021-06-13 ENCOUNTER — Telehealth: Payer: Self-pay

## 2021-06-15 ENCOUNTER — Other Ambulatory Visit: Payer: Self-pay | Admitting: Nurse Practitioner

## 2021-06-15 DIAGNOSIS — N6489 Other specified disorders of breast: Secondary | ICD-10-CM

## 2021-06-15 DIAGNOSIS — R928 Other abnormal and inconclusive findings on diagnostic imaging of breast: Secondary | ICD-10-CM

## 2021-06-19 ENCOUNTER — Other Ambulatory Visit: Payer: Self-pay | Admitting: Nurse Practitioner

## 2021-06-19 DIAGNOSIS — N6489 Other specified disorders of breast: Secondary | ICD-10-CM

## 2021-06-19 DIAGNOSIS — R928 Other abnormal and inconclusive findings on diagnostic imaging of breast: Secondary | ICD-10-CM

## 2021-06-21 ENCOUNTER — Inpatient Hospital Stay
Admission: RE | Admit: 2021-06-21 | Discharge: 2021-06-21 | Disposition: A | Discharge status: Home / Self Care | Payer: Self-pay | Source: Ambulatory Visit | Attending: *Deleted | Admitting: *Deleted

## 2021-06-21 ENCOUNTER — Other Ambulatory Visit: Payer: Self-pay | Admitting: *Deleted

## 2021-06-21 DIAGNOSIS — Z1231 Encounter for screening mammogram for malignant neoplasm of breast: Secondary | ICD-10-CM

## 2021-06-23 ENCOUNTER — Telehealth: Payer: Self-pay

## 2021-06-23 NOTE — Telephone Encounter (Signed)
Unable to [facility], wanted to make she she had breast US done

## 2021-06-23 NOTE — Telephone Encounter (Signed)
Spoke with pt, she is waiting to be scheduled, per Thompson Grayer is waiting on [facility] to send mammogram records, informed pt, she will call [facility] to help push things along so she can get her [place] scheduled.

## 2021-07-07 ENCOUNTER — Ambulatory Visit
Admission: RE | Admit: 2021-07-07 | Discharge: 2021-07-07 | Disposition: A | Discharge status: Home / Self Care | Payer: Medicare HMO | Source: Ambulatory Visit | Attending: Nurse Practitioner | Admitting: Nurse Practitioner

## 2021-07-07 DIAGNOSIS — R928 Other abnormal and inconclusive findings on diagnostic imaging of breast: Secondary | ICD-10-CM | POA: Insufficient documentation

## 2021-07-07 DIAGNOSIS — N6489 Other specified disorders of breast: Secondary | ICD-10-CM

## 2021-07-07 DIAGNOSIS — R922 Inconclusive mammogram: Secondary | ICD-10-CM | POA: Diagnosis not present

## 2021-07-10 ENCOUNTER — Telehealth: Payer: Self-pay

## 2021-07-10 NOTE — Telephone Encounter (Signed)
-----   Message from Jonetta Osgood, NP sent at 07/07/2021  8:56 PM EDT ----- Please let the patient know that her mammogram was BI-RADS 2 benign and that it is recommended that she revert back to annual screenings.

## 2021-07-10 NOTE — Telephone Encounter (Signed)
Spoke to pt, provided results  

## 2021-07-12 NOTE — Telephone Encounter (Signed)
Error

## 2021-07-18 ENCOUNTER — Encounter: Payer: Self-pay | Admitting: Nurse Practitioner

## 2021-08-01 ENCOUNTER — Telehealth: Payer: Self-pay

## 2021-08-01 ENCOUNTER — Other Ambulatory Visit: Payer: Self-pay

## 2021-08-01 MED ORDER — CIPROFLOXACIN HCL 500 MG PO TABS: 500.0000 mg | ORAL_TABLET | Freq: Two times a day (BID) | ORAL | 0 refills | Status: DC

## 2021-08-01 NOTE — Telephone Encounter (Signed)
Pt called that she was  having UTI  as per dr Humphrey Rolls send cipro for 10 days

## 2021-08-17 ENCOUNTER — Other Ambulatory Visit: Payer: Self-pay | Admitting: Nurse Practitioner

## 2021-08-21 ENCOUNTER — Other Ambulatory Visit: Payer: Self-pay | Admitting: Nurse Practitioner

## 2021-08-23 ENCOUNTER — Ambulatory Visit (INDEPENDENT_AMBULATORY_CARE_PROVIDER_SITE_OTHER): Payer: Medicare HMO | Admitting: Nurse Practitioner

## 2021-08-23 ENCOUNTER — Encounter: Payer: Self-pay | Admitting: Nurse Practitioner

## 2021-08-23 VITALS — BP 122/79 | HR 65 | Temp 98.2°F | Resp 16 | Ht 61.0 in | Wt 230.8 lb

## 2021-08-23 DIAGNOSIS — R030 Elevated blood-pressure reading, without diagnosis of hypertension: Secondary | ICD-10-CM | POA: Diagnosis not present

## 2021-08-23 DIAGNOSIS — E1165 Type 2 diabetes mellitus with hyperglycemia: Secondary | ICD-10-CM

## 2021-08-23 DIAGNOSIS — B3731 Acute candidiasis of vulva and vagina: Secondary | ICD-10-CM | POA: Diagnosis not present

## 2021-08-23 LAB — POCT GLYCOSYLATED HEMOGLOBIN (HGB A1C): Hemoglobin A1C: 6.9 % — AB (ref 4.0–5.6)

## 2021-08-23 MED ORDER — CLOTRIMAZOLE-BETAMETHASONE 1-0.05 % EX CREA: 1.0000 | TOPICAL_CREAM | Freq: Every day | CUTANEOUS | 5 refills | Status: DC

## 2021-08-23 NOTE — Progress Notes (Signed)
[facility] [place], [place] [address]  Internal MEDICINE  Office Visit Note  Patient Name: Cassidy Parsons  [id number]  [id number]  Date of Service: 08/23/2021  Chief Complaint  Patient presents with   Follow-up   Diabetes   Hypertension   Gastroesophageal Reflux   Quality Metric Gaps    Shingles Vaccine    HPI Cassidy Parsons presents for a follow up visit for diabetes, hypertension, and weight lost management.  --A1C improved to 6.9, also lost 3 lbs since last visit.  Bp controlled with current meds --yeast rash in groin, requesting topical treatment.  Officially cancer free per oncology after her last scan.    Current Medication: Outpatient Encounter Medications as of 08/23/2021  Medication Sig   allopurinol (ZYLOPRIM) 100 MG tablet Take 1 tablet (100 mg total) by mouth daily.   aspirin EC 81 MG tablet Take 81 mg by mouth daily.   atorvastatin (LIPITOR) 10 MG tablet Take  1 tab po twice a week   azelastine (ASTELIN) 0.1 % nasal spray PLACE 1 SPRAY INTO EACH NOSTRIL DAILY ASDIRECTED.   Calcium Carb-Cholecalciferol 600-200 MG-UNIT TABS Take 1 tablet by mouth daily.   cetirizine (ZYRTEC) 10 MG tablet Take 10 mg by mouth daily.   clobetasol (TEMOVATE) 0.05 % external solution Apply 1 application. topically 2 (two) times daily.   clotrimazole-betamethasone (LOTRISONE) cream Apply 1 Application topically daily.   esomeprazole (NEXIUM) 40 MG capsule Take 40 mg by mouth daily at 12 noon.   fluticasone (FLONASE) 50 MCG/ACT nasal spray Place 1 spray into both nostrils daily.   ketoconazole (NIZORAL) 2 % shampoo APPLY 1 APPLICATION TOPICALLY TWICE A WEEK.   montelukast (SINGULAIR) 10 MG tablet TAKE 1 TABLET BY MOUTH DAILY AT BEDTIME   Multiple Vitamins-Minerals (MULTIVITAMIN ADULT PO) Take 1 tablet by mouth daily.   nystatin (MYCOSTATIN/NYSTOP) powder Apply 1 application. topically 3 (three) times daily as needed.   oxybutynin (DITROPAN) 5 MG tablet TAKE (1)  TABLET BY MOUTH EVERY DAY   Semaglutide (RYBELSUS) 7 MG TABS Take 7 mg by mouth daily.   [DISCONTINUED] ciprofloxacin (CIPRO) 500 MG tablet Take 1 tablet (500 mg total) by mouth 2 (two) times daily.   [DISCONTINUED] fluocinonide (LIDEX) 0.05 % external solution Apply 1 application topically 2 (two) times daily.   No facility-administered encounter medications on file as of 08/23/2021.    Surgical History: Past Surgical History:  Procedure Laterality Date   BRAIN SURGERY     BREAST BIOPSY     BREAST SURGERY     biopsy right breast   CRANIOTOMY N/A 02/10/2015   Procedure: Removal of cranial plate with scalp wound revision;  Surgeon: Earnie Larsson, MD;  Location: [facility];  Service: Neurosurgery;  Laterality: N/A;   DIAGNOSTIC LAPAROSCOPY     panniculectomy   PARTIAL HYSTERECTOMY     TONSILLECTOMY      Medical History: Past Medical History:  Diagnosis Date   Asthma    Brain tumor (benign) ([place])    right side   Diabetes mellitus without complication ([name])    borderline   Early cataracts, bilateral    Environmental and seasonal allergies    GERD (gastroesophageal reflux disease)    Hypertension    PMH:   On home oxygen therapy    pt is no longer  using oxygen at night   Seizures ([place])    haven't had a seizure since brain surgery in 2001   Uterine cancer [facility])     Family  History: Family History  Problem Relation Age of Onset   Cancer Mother    Diabetes Father    Heart failure Father    Depression Sister    Stroke Sister    Depression Sister    Breast cancer Maternal Aunt    Cancer Brother    Cancer Brother    Cancer Brother    Diabetes Other     Social History   Socioeconomic History   Marital status: Widowed    Spouse name: Not on file   Number of children: Not on file   Years of education: Not on file   Highest education level: Not on file  Occupational History   Not on file  Tobacco Use   Smoking status: Former    Types: Cigarettes   Smokeless  tobacco: Never   Tobacco comments:    quit smoking cigarettes 34 years ago  Vaping Use   Vaping Use: Never used  Substance and Sexual Activity   Alcohol use: No   Drug use: No   Sexual activity: Not on file  Other Topics Concern   Not on file  Social History Narrative   Not on file   Social Determinants of Health   Financial Resource Strain: Low Risk  (03/15/2021)   Overall Financial Resource Strain (CARDIA)    Difficulty of Paying Living Expenses: Not hard at all  Food Insecurity: No Food Insecurity (03/15/2021)   Hunger Vital Sign    Worried About Running Out of Food in the Last Year: Never true    [place] in the Last Year: Never true  Transportation Needs: No Transportation Needs (03/15/2021)   PRAPARE - Hydrologist (Medical): No    Lack of Transportation (Non-Medical): No  Physical Activity: Not on file  Stress: Not on file  Social Connections: Not on file  Intimate Partner Violence: Not on file      Review of Systems  Constitutional:  Negative for chills, fatigue and unexpected weight change.  HENT:  Negative for congestion, rhinorrhea, sneezing and sore throat.   Eyes:  Negative for redness.  Respiratory:  Negative for cough, chest tightness and shortness of breath.   Cardiovascular:  Negative for chest pain and palpitations.  Gastrointestinal:  Negative for abdominal pain, constipation, diarrhea, nausea and vomiting.  Genitourinary:  Negative for dysuria and frequency.  Musculoskeletal:  Negative for arthralgias, back pain, joint swelling and neck pain.  Skin:  Negative for rash.  Neurological: Negative.  Negative for tremors and numbness.  Hematological:  Negative for adenopathy. Does not bruise/bleed easily.  Psychiatric/Behavioral:  Negative for behavioral problems (Depression), sleep disturbance and suicidal ideas. The patient is not nervous/anxious.     Vital Signs: BP 122/79   Pulse 65   Temp 98.2 F (36.8 C)   Resp 16    Ht '5\' 1"'$  (1.549 m)   Wt 230 lb 12.8 oz (104.7 kg)   SpO2 97%   BMI 43.61 kg/m    Physical Exam Vitals reviewed.  Constitutional:      General: She is not in acute distress.    Appearance: Normal appearance. She is obese. She is not ill-appearing.  HENT:     Head: Normocephalic and atraumatic.  Eyes:     Pupils: Pupils are equal, round, and reactive to light.  Cardiovascular:     Rate and Rhythm: Normal rate and regular rhythm.  Pulmonary:     Effort: Pulmonary effort is normal. No respiratory distress.  Neurological:     Mental Status: She is alert and oriented to person, place, and time.  Psychiatric:        Mood and Affect: Mood normal.        Behavior: Behavior normal.        Assessment/Plan: 1. Type 2 diabetes mellitus with hyperglycemia, without long-term current use of insulin (HCC) A1c continues to improve. No changes, continue medications as prescribed.  - POCT HgB A1C  2. Yeast vaginitis Topical treatment prescribed.  - clotrimazole-betamethasone (LOTRISONE) cream; Apply 1 Application topically daily.  Dispense: 45 g; Refill: 5  3. White coat syndrome without diagnosis of hypertension Stable, not on any medications.    General Counseling: rian koon understanding of the findings of todays visit and agrees with plan of treatment. I have discussed any further diagnostic evaluation that may be needed or ordered today. We also reviewed her medications today. she has been encouraged to call the office with any questions or concerns that should arise related to todays visit.    Orders Placed This Encounter  Procedures   POCT HgB A1C    Meds ordered this encounter  Medications   clotrimazole-betamethasone (LOTRISONE) cream    Sig: Apply 1 Application topically daily.    Dispense:  45 g    Refill:  5    Please fill new script today thanks    Return in about 3 months (around 11/23/2021) for F/U, Recheck A1C, Charvis Lightner PCP.   Total time spent:30  Minutes Time spent includes review of chart, medications, test results, and follow up plan with the patient.   [place] Controlled Substance Database was reviewed by me.  This patient was seen by Jonetta Osgood, FNP-C in collaboration with Dr. Clayborn Bigness as a part of collaborative care agreement.   Thi Klich R. Valetta Fuller, MSN, FNP-C Internal medicine

## 2021-09-13 DIAGNOSIS — D3132 Benign neoplasm of left choroid: Secondary | ICD-10-CM | POA: Diagnosis not present

## 2021-09-13 LAB — HM DIABETES EYE EXAM

## 2021-09-20 ENCOUNTER — Encounter: Payer: Self-pay | Admitting: Internal Medicine

## 2021-09-27 ENCOUNTER — Telehealth: Payer: Self-pay

## 2021-09-27 NOTE — Telephone Encounter (Signed)
tried to call Lanetta Inch and if she calls back let her know that I found the paperwork for pt asst for [employer] and she will need to complete another application as the other one is from NOV 2022.  also she needs to have the statement she gets from [facility] that tells what she makes each month and year, out of pocket expense from her pharmacy that shows what she has paid for the year and also have pharmacist sign it.  also and pensions or other income I will need statements on them also.  the DO NOT ACCEPT bank statements showing them listed they need the actual statement showing from the company what she is making for the year or each month.

## 2021-10-11 ENCOUNTER — Inpatient Hospital Stay: Payer: Medicare HMO | Attending: Obstetrics and Gynecology | Admitting: Obstetrics and Gynecology

## 2021-10-11 VITALS — BP 167/90 | HR 70 | Temp 98.7°F | Resp 18 | Wt 232.1 lb

## 2021-10-11 DIAGNOSIS — C541 Malignant neoplasm of endometrium: Secondary | ICD-10-CM

## 2021-10-11 DIAGNOSIS — E1136 Type 2 diabetes mellitus with diabetic cataract: Secondary | ICD-10-CM | POA: Diagnosis not present

## 2021-10-11 DIAGNOSIS — Z803 Family history of malignant neoplasm of breast: Secondary | ICD-10-CM | POA: Insufficient documentation

## 2021-10-11 DIAGNOSIS — Z90710 Acquired absence of both cervix and uterus: Secondary | ICD-10-CM | POA: Diagnosis not present

## 2021-10-11 DIAGNOSIS — Z87891 Personal history of nicotine dependence: Secondary | ICD-10-CM | POA: Diagnosis not present

## 2021-10-11 DIAGNOSIS — Z6841 Body Mass Index (BMI) 40.0 and over, adult: Secondary | ICD-10-CM | POA: Insufficient documentation

## 2021-10-11 DIAGNOSIS — E119 Type 2 diabetes mellitus without complications: Secondary | ICD-10-CM

## 2021-10-11 DIAGNOSIS — I10 Essential (primary) hypertension: Secondary | ICD-10-CM | POA: Insufficient documentation

## 2021-10-11 DIAGNOSIS — R69 Illness, unspecified: Secondary | ICD-10-CM | POA: Diagnosis not present

## 2021-10-11 DIAGNOSIS — Z8542 Personal history of malignant neoplasm of other parts of uterus: Secondary | ICD-10-CM | POA: Insufficient documentation

## 2021-10-11 DIAGNOSIS — F418 Other specified anxiety disorders: Secondary | ICD-10-CM | POA: Insufficient documentation

## 2021-10-11 DIAGNOSIS — N898 Other specified noninflammatory disorders of vagina: Secondary | ICD-10-CM | POA: Diagnosis not present

## 2021-10-11 DIAGNOSIS — R918 Other nonspecific abnormal finding of lung field: Secondary | ICD-10-CM | POA: Insufficient documentation

## 2021-10-11 DIAGNOSIS — L309 Dermatitis, unspecified: Secondary | ICD-10-CM | POA: Diagnosis not present

## 2021-10-11 NOTE — Progress Notes (Signed)
Gynecologic Oncology Interval Visit   Referring Provider: Dr. Kenton Kingfisher  Chief Complaint: 1A mixed clear cell/endometrioid endometrial carcinoma the endometrium status post TLH/BSO, radiation  Subjective:  Cassidy Parsons is a 76 y.o. 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 Initial pathology from endometrial biopsy was reviewed at [facility] and confirmed clear cell carcinoma. She returns to clinic for continued surveillance.    Patient was previously noted to have possible vaginal cuff mass. This was followed up with CT in September 2022 that was negative except for 2.4 cm nodule thought to represent lymph node and be above field of RT, near IVC. This was followed up with CT in December 2022 which showed increase in size to 2.6 cm. Biopsy was performed which was negative however, limited sample. 01/12/2021 PET scan was recommended which showed soft tissue stranding in right retroperitoneum with mild FDG activity. The retroperitoneal LN was obscured and avidity could not be characterized. No other metastatic disease was noted. Incidental chronic sacral insufficiency fractures. CT 06/02/21 for follow up showed resolution of previously enlarged right retroperitoneal lymph node. No other evidence of disease or adenopathy.   She had mammogram at [facility] performed on 05/31/21 which showed some abnormality of right breast. She had diagnostic imaging on 07/07/21 which was reported as benign appearing. She declines additional colonoscopy.   Denies bleeding, pain, discharge.   She traveled to [place] for grandson's wedding which was a great event. Also went to [place] for celebration of life.     Gyn Oncology history Cassidy Parsons is a pleasant female seen for surveillance of stage IA, mixed clear cell grade 3 endometrioid adenocarcinoma of the  endometrium.  Patient had ~ 5 year history of PMB, worse since May 2021 after she fell on her stomach. On 2022/08/13 underwent endometrial biopsy. - Clear cell carcinoma, FIGO grade 3  Pap 08-13-2019- NILM  Husband died in 03-29-22 and she delayed self care because she was taking care of him.   CT scan C/A/P 08/07/19 did not show metastatic disease. Impression:  1. Heterogeneous and thickened appearance of the endometrium in keeping with patients known primary malignancy.  2. No definite evidence of metastatic disease in the chest, abdomen, or pelvis.  3. Bilateral pulmonary nodules, nonspecific. Recommend continued attention on follow-up.  4. Mild thickening of the sigmoid colon and tethering of the superior bladder wall, favored sequela of prior diverticulitis.   Underwent TLH, BSx/RO SLN mapping at [facility] 08/11/19.  Grade 2 endometrioid 4.2 cm cancer with 26% invasion.  Negative right adnexa and cervix and no LVSI.  Left adnexa could not be removed due to dense adhesions of sigmoid colon secondary to severe diverticular disease.   Comment: The tumor demonstrates foci of well-formed endometrioid glands with defined nuclear polarity, but a considerable portion of the tumor appears solid, with large areas of squamous differentiation.  Morphologic features of clear cell carcinoma, such as marked nuclear atypia, hobnail architecture, hyalinized stroma, and intracytoplasmic eosinophilic globules, are not identified.  Secretory change is present in some cells, which stain positive for the markers Napsin A and AMACR.  The reported history of a prior biopsy with clear cell carcinoma is noted.  Review of this biopsy may be beneficial to exclude the possibility of a mixed clear cell/endometrioid tumor, if this diagnosis would influence patient management.  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   Slides were reviewed at [facility]. Dr. Fransisca Connors recommended adjuvant WPRT.   10/20/19-11/23/19  completed WPRT. Suffered diarrhea and fatigue during treatment and she has a h/o urinary frequency unrelieved by oxybutynin.   History of pulmonary nodules:  Previous imaging showed bilateral pulmonary nodules, 2-3 mm nonspecific on CT scan. Per patient, results were reassuring. [facility] radiology regarding read on 07/18/20 CT scan. No further follow up needed given stability.   09/19/2020 CT ABDOMEN AND PELVIS WITH CONTRAST  Soft tissue nodule at the right aspect of the aorta, anterior to the right ureter, measuring up to 10 x 8 x 24 mm (series 2, image 49 and series 6, image 118), which does not fill with contrast on the delayed sequence.   12/26/20 CT ABDOMEN AND PELVIS WITH CONTRAST Enlarged visualized portions of great saphenous veins with multiple associated distended anterior pelvic wall veins. 2.6 x 1.2 cm enlarged right retroperitoneal nodule or lymph node anterior to the mid ureter (5:59 and 2:51), previously 2.4 x 1. No new adenopathy.   01/12/21  PET 1. Soft tissue stranding of the right retroperitoneum with mild FDG uptake, likely postprocedural changes. Previously described enlarged right retroperitoneal lymph node is obscured and FDG avidity cannot be characterized. 2. No additional sites of hypermetabolic activity to suggest metastatic disease. 3. Sclerosis of the sacrum with associated linear lucencies and mild FDG uptake, findings are similar to prior exams and likely due to chronic sacral insufficiency fractures. 4.  Aortic Atherosclerosis (ICD10-I70.0).  06/02/21 Follow up CT was negative.  1. No evidence of metastatic disease in the abdomen or pelvis. 2. Cholelithiasis.   Problem List: Patient Active Problem List   Diagnosis Date Noted   Primary clear cell adenocarcinoma of endometrium ([place]) 12/23/2019   Morbid obesity with BMI of 45.0-49.9, adult ([place]) 08/12/2019   Urinary tract infection without hematuria 08/07/2018   Dysuria 08/07/2018   Osteoarthritis of knee  04/11/2017   Low back pain 02/05/2017   Mild persistent asthma, uncomplicated 54/65/6812   Rhinitis, allergic 02/05/2017   GAD (generalized anxiety disorder) 02/05/2017   Unspecified complication of internal orthopedic prosthetic device, implant and graft, initial encounter ([place]) 02/05/2017   Diabetes mellitus without complication ([place]) 75/17/0017   Gout, unspecified 02/05/2017   Occlusion and stenosis of bilateral carotid arteries 02/05/2017   Insomnia, unspecified 02/05/2017   Sleep apnea 02/05/2017   Elevated blood-pressure reading without diagnosis of hypertension 02/05/2017   Dupuytren's disease of palm 10/05/2016   Exposed orthopaedic hardware ([place]) 02/10/2015    Past Medical History: Past Medical History:  Diagnosis Date   Asthma    Brain tumor (benign) ([place])    right side   Diabetes mellitus without complication ([place])    borderline   Early cataracts, bilateral    Environmental and seasonal allergies    GERD (gastroesophageal reflux disease)    Hypertension    PMH:   On home oxygen therapy    pt is no longer  using oxygen at night   Seizures ([place])    haven't had a seizure since brain surgery in 2001   Uterine cancer ([place])     Past Surgical History: Past Surgical History:  Procedure Laterality Date   BRAIN SURGERY     BREAST BIOPSY     BREAST SURGERY     biopsy right breast   CRANIOTOMY N/A 02/10/2015   Procedure: Removal of cranial plate with scalp wound revision;  Surgeon: Earnie Larsson, MD;  Location: [facility] NEURO ORS;  Service: Neurosurgery;  Laterality: N/A;  DIAGNOSTIC LAPAROSCOPY     panniculectomy   PARTIAL HYSTERECTOMY     TONSILLECTOMY      Past Gynecologic History:  Menarche: age [age] Post menopausal Hx of post menopausal bleeding    OB History:  OB History  Gravida Para Term Preterm AB Living  _0 SAB IAB Ectopic Multiple Live Births               # Outcome Date GA Lbr Len/2nd Weight Sex Delivery Anes PTL Lv  2 AB           1  Term             Family History: Family History  Problem Relation Age of Onset   Cancer Mother    Diabetes Father    Heart failure Father    Depression Sister    Stroke Sister    Depression Sister    Breast cancer Maternal Aunt    Cancer Brother    Cancer Brother    Cancer Brother    Diabetes Other     Social History: Social History   Socioeconomic History   Marital status: Widowed    Spouse name: Not on file   Number of children: Not on file   Years of education: Not on file   Highest education level: Not on file  Occupational History   Not on file  Tobacco Use   Smoking status: Former    Types: Cigarettes   Smokeless tobacco: Never   Tobacco comments:    quit smoking cigarettes 34 years ago  Vaping Use   Vaping Use: Never used  Substance and Sexual Activity   Alcohol use: No   Drug use: No   Sexual activity: Not on file  Other Topics Concern   Not on file  Social History Narrative   Not on file   Social Determinants of Health   Financial Resource Strain: Low Risk  (03/15/2021)   Overall Financial Resource Strain (CARDIA)    Difficulty of Paying Living Expenses: Not hard at all  Food Insecurity: No Food Insecurity (03/15/2021)   Hunger Vital Sign    Worried About Running Out of Food in the Last Year: Never true    [place] in the Last Year: Never true  Transportation Needs: No Transportation Needs (03/15/2021)   PRAPARE - Hydrologist (Medical): No    Lack of Transportation (Non-Medical): No  Physical Activity: Not on file  Stress: Not on file  Social Connections: Not on file  Intimate Partner Violence: Not on file   Immunization History  Administered Date(s) Administered   Influenza-Unspecified 10/22/2016, 09/21/2019, 10/10/2020   Moderna SARS-COV2 Booster Vaccination 12/15/2019, 11/04/2020   Moderna Sars-Covid-2 Vaccination 03/02/2019, 03/31/2019, 06/28/2020   Pneumococcal Conjugate-13 07/23/2015   Pneumococcal  Polysaccharide-23 02/18/2018   Zoster, Live 10/16/2012   Zoster, Unspecified 10/16/2012   Allergies: Allergies  Allergen Reactions   Dust Mite Extract    Mold Extract  [Trichophyton]    Molds & Smuts Other (See Comments)   Pollen Extract    Roxanol [Morphine] Other (See Comments)    Extreme pain   Meperidine Rash    Itching   Meperidine And Related Itching and Rash   Other Itching, Rash and Other (See Comments)    All pain relievers except hydrocodone Trees, mold, dust, and perfume allergies (sneezing, eyes swell up)   Propoxyphene Itching and Rash   Tape Itching and Rash  With paper tape.  Please use adhesive tape Rash   Tramadol Itching and Rash    Current Medications: Current Outpatient Medications  Medication Sig Dispense Refill   allopurinol (ZYLOPRIM) 100 MG tablet Take 1 tablet (100 mg total) by mouth daily. 90 tablet 1   aspirin EC 81 MG tablet Take 81 mg by mouth daily.     atorvastatin (LIPITOR) 10 MG tablet Take  1 tab po twice a week 90 tablet 1   azelastine (ASTELIN) 0.1 % nasal spray PLACE 1 SPRAY INTO EACH NOSTRIL DAILY ASDIRECTED. 30 mL 3   Calcium Carb-Cholecalciferol 600-200 MG-UNIT TABS Take 1 tablet by mouth daily.     cetirizine (ZYRTEC) 10 MG tablet Take 10 mg by mouth daily.     clobetasol (TEMOVATE) 0.05 % external solution Apply 1 application. topically 2 (two) times daily. 50 mL 3   clotrimazole-betamethasone (LOTRISONE) cream Apply 1 Application topically daily. 45 g 5   esomeprazole (NEXIUM) 40 MG capsule Take 40 mg by mouth daily at 12 noon.     fluticasone (FLONASE) 50 MCG/ACT nasal spray Place 1 spray into both nostrils daily. 16 g 3   ketoconazole (NIZORAL) 2 % shampoo APPLY 1 APPLICATION TOPICALLY TWICE A WEEK. 120 mL 3   montelukast (SINGULAIR) 10 MG tablet TAKE 1 TABLET BY MOUTH DAILY AT BEDTIME 90 tablet 1   Multiple Vitamins-Minerals (MULTIVITAMIN ADULT PO) Take 1 tablet by mouth daily.     nystatin (MYCOSTATIN/NYSTOP) powder Apply 1  application. topically 3 (three) times daily as needed. 60 g 0   oxybutynin (DITROPAN) 5 MG tablet TAKE (1) TABLET BY MOUTH EVERY DAY 90 tablet 0   Semaglutide (RYBELSUS) 7 MG TABS Take 7 mg by mouth daily. 90 tablet 1   No current facility-administered medications for this visit.   Review of Systems General:  no complaints Skin: no complaints Eyes: no complaints HEENT: no complaints Breasts: no complaints Pulmonary: no complaints Cardiac: no complaints Gastrointestinal: no complaints Genitourinary/Sexual: no complaints Ob/Gyn: no complaints Musculoskeletal: back pain Hematology: no complaints Neurologic/Psych: no complaints    Objective:  Physical Examination:  BP (!) 167/90   Pulse 70   Temp 98.7 F (37.1 C)   Resp 18   Wt 232 lb 1.6 oz (105.3 kg)   SpO2 97%   BMI 43.85 kg/m    ECOG Performance Status: 1 - Symptomatic but completely ambulatory  GENERAL: Patient is a well appearing female in no acute distress HEENT:  Sclera clear. Anicteric NODES:  Negative axillary, supraclavicular, inguinal lymph node survery LUNGS:  Clear to auscultation bilaterally.   HEART:  Regular rate and rhythm.  ABDOMEN:  Soft, nontender.  No hernias, incisions well healed. No masses or ascites.  EXTREMITIES:  No peripheral edema. Atraumatic. No cyanosis SKIN:  Skin maceration at skin folds, worse in left inguinal skin fold d/t moisture.  NEURO:  Nonfocal. Well oriented.  Appropriate affect.  Pelvic: exam chaperoned by CMA;   Vulva: normal appearing vulva with no masses, tenderness or lesions Vagina: normal vagina other than possible 1.5-2 cm mass mid cuff to right vaginal fornix Cervix: surgically absent Uterus: surgically absent BME: no adnexal masses; see vaginal cuff findings as noted above        Assessment:  Cassidy Parsons is a 76 y.o. female diagnosed with 1A mixed clear cell/endometrioid endometrial carcinoma (MSI; dMMR with loss of MLH1/PMS2 with methylated MLH1 alleles  present) s/p TLH, [facility] 08/11/19. SLN mapping unsuccessful and left adnexa could not be removed due to  severe adhesions from diverticular disease.  No LVSI, or cervical/adnexal involvement and invades 23% of the myometrium. Pre-op CT scan negative for metastatic disease. Treated with whole pelvic radiation 11/21.  Possible vaginal cuff mass, CT 09/2020 showed 2.4 nodule thought to represent lymph node, near IVC, above field of RT. CT 12/2020 increased to 2.6 cm. Biopsy was negative though no lymphatic tissue present. PET scan showed stranding of right retroperitoneum, mildly FDG active. Retroperitoneal LN was obscured and could not be characterized. Follow up CT 06/02/21 was negative. She is asymptomatic and clinically NED. Exam somewhat concerning with 1.5-2 cm vaginal cuff mass. Recommend continued surveillance.   Pulmonary nodules, 07/2019 on CT chest, on follow up CT at [place] 2022 report stable. No additional imaging was recommended.   Intertrigous dermatitis  Depression and anxiety- improved.   Medical co-morbidities complicating care: HTN, morbid obesity (Body mass index is 43.85 kg/m.), prediabetes, possible h/o carotid stenosis.  Plan:   Problem List Items Addressed This Visit       Genitourinary   Primary clear cell adenocarcinoma of endometrium ([place]) - Primary   Other Visit Diagnoses     Vaginal mass          Given resolution of possible retroperitoneal lymph node and findings of persistent vaginal cuff mass, recommend continued surveillance and no additional imaging at this time unless concerning findings on exam or symptoms. She is concerned about cost of imaging studies. Patient agreeable.   Recommend follow up/surveillance with pelvic exam in 2-3 months. If recurrent disease, she would be a candidate for chemotherapy or pembrolizumab/immunotherapy.   Intertrigous dermatitis- keep area cool, dry. She has been putting Vagisil on her affected skin areas. I recommended nystatin.  We previously refilled topical nystatin to control candida component and recommended that she restart this medication. She would like to use the lotrimin that she also has which is probably ok as well. Requested she discuss with her PCP.   I personally had a face to face interaction and evaluated the patient jointly with the NP, Ms. Beckey Rutter.  I have reviewed her history and available records and have performed the key portions of the physical exam including lymph node survey, abdominal exam, pelvic exam with my findings confirming those documented above by the APP.  I have discussed the case with the APP and the patient.  I agree with the above documentation, assessment and plan which was fully formulated by me.  Counseling was completed by me.   I personally saw the patient and performed a substantive portion of this encounter in conjunction with the listed APP as documented above.  Raena Pau Gaetana Michaelis, MD

## 2021-10-22 ENCOUNTER — Encounter: Payer: Self-pay | Admitting: Nurse Practitioner

## 2021-10-24 ENCOUNTER — Telehealth: Payer: Self-pay | Admitting: Nurse Practitioner

## 2021-10-24 NOTE — Telephone Encounter (Signed)
Gave pt samples until her next appt and also we are working on Pt assistance program if not covered we will change at next visit

## 2021-10-25 ENCOUNTER — Telehealth: Payer: Self-pay

## 2021-10-25 NOTE — Telephone Encounter (Signed)
Paper were fax for the patient assistance program for the medicine [employer].

## 2021-10-31 IMAGING — CR DG LUMBAR SPINE COMPLETE 4+V
1 series · 5 of 5 positions shown · non-contrast
Comparison: None.

CLINICAL DATA: 74-year-old female with low back pain and sciatica

EXAM:
LUMBAR SPINE - COMPLETE 4+ VIEW

[Series 1: dg lumbar spine complete 4 +v · 0.14mm/px · 5 of 5 slices shown]
[im 1/5]
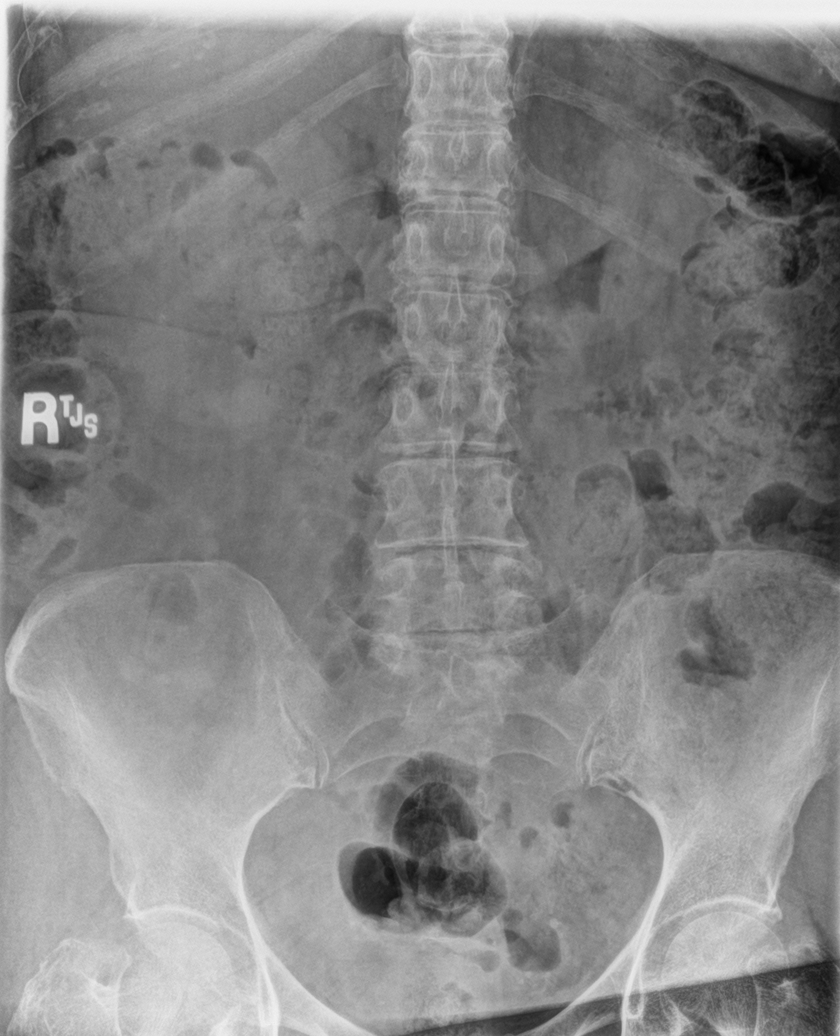
[im 2/5]
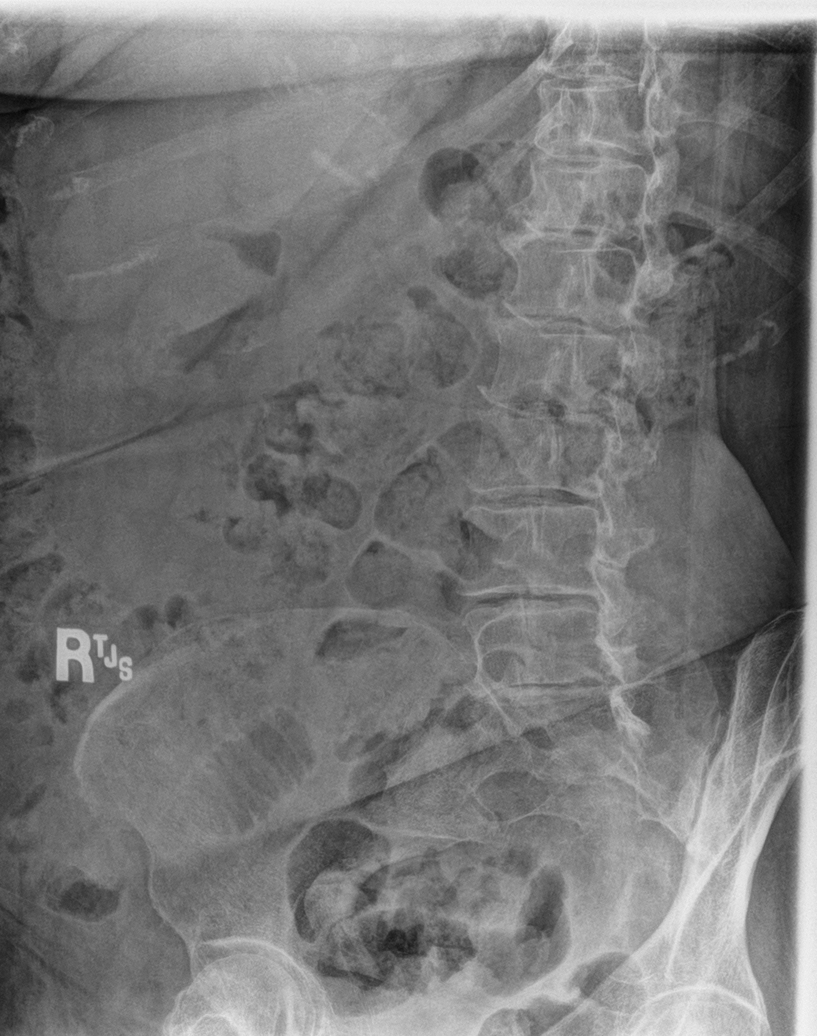
[im 3/5]
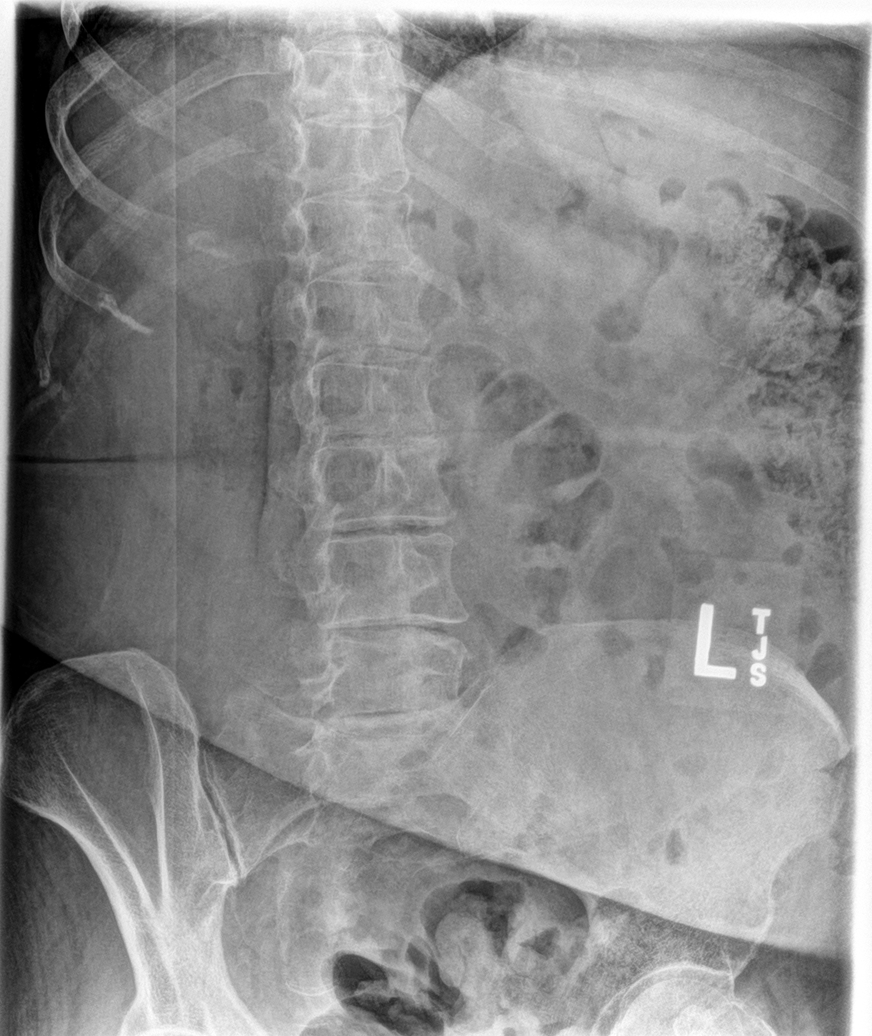
[im 4/5]
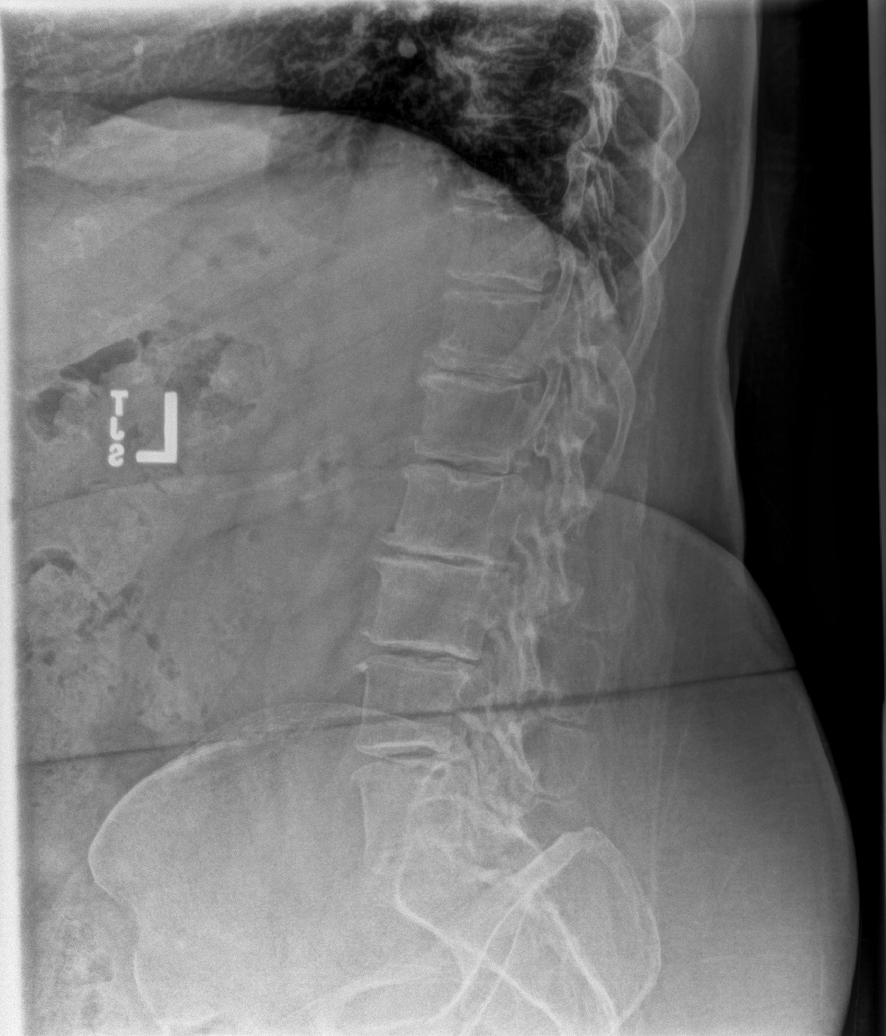
[im 5/5]
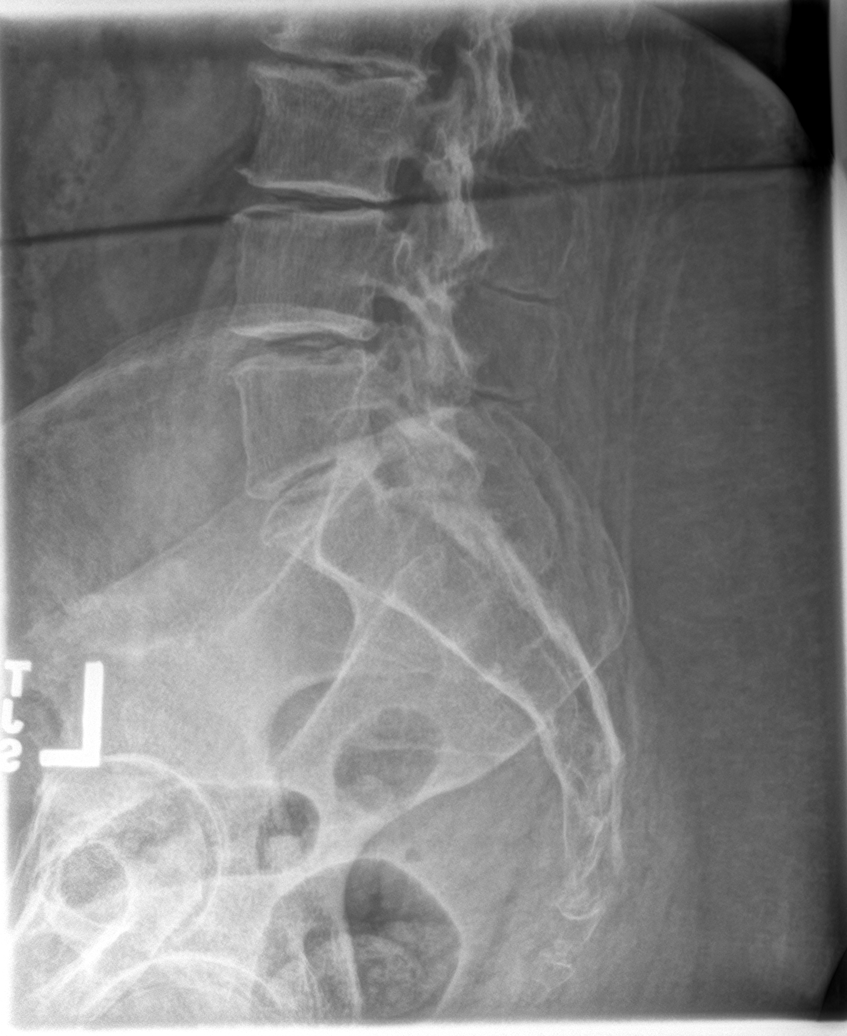

[5 of 5 positions shown; findings below may reference images not displayed]

FINDINGS: Five lumbar type vertebra. There is no acute fracture or subluxation
of the lumbar spine. There are multilevel degenerative changes with
disc space narrowing and endplate irregularity and spurring.
Multilevel facet arthropathy. There is moderate stool throughout the
colon. The soft tissues are grossly unremarkable.
IMPRESSION: 1. No acute fracture or subluxation of the lumbar spine.
2. Multilevel degenerative changes.

## 2021-11-09 ENCOUNTER — Ambulatory Visit: Payer: Medicare HMO | Admitting: Nurse Practitioner

## 2021-11-15 ENCOUNTER — Telehealth: Payer: Medicare HMO

## 2021-11-17 ENCOUNTER — Other Ambulatory Visit: Payer: Self-pay | Admitting: Nurse Practitioner

## 2021-11-22 ENCOUNTER — Other Ambulatory Visit: Payer: Self-pay

## 2021-11-22 ENCOUNTER — Ambulatory Visit: Payer: Medicare HMO | Admitting: Nurse Practitioner

## 2021-11-22 MED ORDER — OXYBUTYNIN CHLORIDE 5 MG PO TABS: 5.0000 mg | ORAL_TABLET | Freq: Every day | ORAL | 0 refills | Status: DC

## 2021-11-24 ENCOUNTER — Encounter: Payer: Self-pay | Admitting: Nurse Practitioner

## 2021-11-24 ENCOUNTER — Other Ambulatory Visit: Payer: Self-pay

## 2021-11-24 ENCOUNTER — Ambulatory Visit: Payer: Medicare HMO | Admitting: Radiation Oncology

## 2021-11-24 ENCOUNTER — Ambulatory Visit (INDEPENDENT_AMBULATORY_CARE_PROVIDER_SITE_OTHER): Payer: Medicare HMO | Admitting: Nurse Practitioner

## 2021-11-24 VITALS — BP 138/72 | HR 63 | Temp 97.2°F | Resp 16 | Ht 61.0 in | Wt 235.8 lb

## 2021-11-24 DIAGNOSIS — E559 Vitamin D deficiency, unspecified: Secondary | ICD-10-CM | POA: Diagnosis not present

## 2021-11-24 DIAGNOSIS — R300 Dysuria: Secondary | ICD-10-CM

## 2021-11-24 DIAGNOSIS — E1165 Type 2 diabetes mellitus with hyperglycemia: Secondary | ICD-10-CM | POA: Diagnosis not present

## 2021-11-24 DIAGNOSIS — I10 Essential (primary) hypertension: Secondary | ICD-10-CM

## 2021-11-24 DIAGNOSIS — E782 Mixed hyperlipidemia: Secondary | ICD-10-CM | POA: Diagnosis not present

## 2021-11-24 DIAGNOSIS — Z0001 Encounter for general adult medical examination with abnormal findings: Secondary | ICD-10-CM

## 2021-11-24 LAB — POCT GLYCOSYLATED HEMOGLOBIN (HGB A1C): Hemoglobin A1C: 7.1 % — AB (ref 4.0–5.6)

## 2021-11-24 MED ORDER — ACCU-CHEK GUIDE W/DEVICE KIT
PACK | 0 refills | Status: DC
Start: 2021-11-24 — End: 2023-02-21

## 2021-11-24 MED ORDER — ACCU-CHEK GUIDE VI STRP: ORAL_STRIP | 1 refills | Status: DC

## 2021-11-24 MED ORDER — ACCU-CHEK SOFTCLIX LANCETS MISC: 0 refills | Status: DC

## 2021-11-24 MED ORDER — GLIPIZIDE ER 5 MG PO TB24: 5.0000 mg | ORAL_TABLET | Freq: Every day | ORAL | 1 refills | Status: DC

## 2021-11-24 NOTE — Progress Notes (Unsigned)
[facility] [place], [place] [address]  Internal MEDICINE  Office Visit Note  Patient Name: Cassidy Parsons  [id number]  [id number]  Date of Service: 11/24/2021  Chief Complaint  Patient presents with   Medicare Wellness   Diabetes   Gastroesophageal Reflux   Hypertension    HPI Cassidy Parsons presents for an annual well visit and physical exam.  Well appearing 76 year old female with diabetes, hypertension, asthma, allergic rhinitis, osteoarthritis of knees, GAD, insomnia, and history of endometrial cancer. Patient reports that she feels really good and has no concerns except for the high copay for rybelsus.  --A1c is 7.1 today, slightly elevated from previous by 0.2 --mammogram was done in may with [facility], birads 0, incomplete, needs to have additional imaging and [facility] is supposed to send a letter.  --patient declines any future colonoscopy and is on the edge of recommended age bracket  --diabetic eye exam was done in September this year.  -DEXA scan was done in 2020.  ---unable to afford $600 copay for rybelsus, still waiting to hear from manufacturer about financial assistance. Need to change medication to something more affordable. If financial assistance is granted, will switch back to rybelsus.  ---does not have glucose meter.  ---due for routine labs.      Current Medication: Outpatient Encounter Medications as of 11/24/2021  Medication Sig   allopurinol (ZYLOPRIM) 100 MG tablet TAKE (1) TABLET BY MOUTH EVERY DAY   aspirin EC 81 MG tablet Take 81 mg by mouth daily.   atorvastatin (LIPITOR) 10 MG tablet Take  1 tab po twice a week   azelastine (ASTELIN) 0.1 % nasal spray PLACE 1 SPRAY INTO EACH NOSTRIL DAILY ASDIRECTED.   Calcium Carb-Cholecalciferol 600-200 MG-UNIT TABS Take 1 tablet by mouth daily.   cetirizine (ZYRTEC) 10 MG tablet Take 10 mg by mouth daily.   clobetasol (TEMOVATE) 0.05 % external solution Apply 1 application. topically 2 (two)  times daily.   clotrimazole-betamethasone (LOTRISONE) cream Apply 1 Application topically daily.   esomeprazole (NEXIUM) 40 MG capsule Take 40 mg by mouth daily at 12 noon.   fluticasone (FLONASE) 50 MCG/ACT nasal spray Place 1 spray into both nostrils daily.   glipiZIDE (GLUCOTROL XL) 5 MG 24 hr tablet Take 1 tablet (5 mg total) by mouth daily with breakfast.   ketoconazole (NIZORAL) 2 % shampoo APPLY 1 APPLICATION TOPICALLY TWICE A WEEK.   montelukast (SINGULAIR) 10 MG tablet TAKE 1 TABLET BY MOUTH DAILY AT BEDTIME   Multiple Vitamins-Minerals (MULTIVITAMIN ADULT PO) Take 1 tablet by mouth daily.   nystatin (MYCOSTATIN/NYSTOP) powder Apply 1 application. topically 3 (three) times daily as needed.   oxybutynin (DITROPAN) 5 MG tablet Take 1 tablet (5 mg total) by mouth daily.   [DISCONTINUED] Accu-Chek Softclix Lancets lancets by Other route. Use as instructed   [DISCONTINUED] Blood Glucose Monitoring Suppl (ACCU-CHEK GUIDE) w/Device KIT by Does not apply route.   [DISCONTINUED] glucose blood (ACCU-CHEK GUIDE) test strip 1 each by Other route daily. Use as instructed   [DISCONTINUED] Semaglutide (RYBELSUS) 7 MG TABS Take 7 mg by mouth daily.   No facility-administered encounter medications on file as of 11/24/2021.    Surgical History: Past Surgical History:  Procedure Laterality Date   BRAIN SURGERY     BREAST BIOPSY     BREAST SURGERY     biopsy right breast   CRANIOTOMY N/A 02/10/2015   Procedure: Removal of cranial plate with scalp wound revision;  Surgeon: Earnie Larsson, MD;  Location: [facility];  Service: Neurosurgery;  Laterality: N/A;   DIAGNOSTIC LAPAROSCOPY     panniculectomy   PARTIAL HYSTERECTOMY     TONSILLECTOMY      Medical History: Past Medical History:  Diagnosis Date   Asthma    Brain tumor (benign) (HCC)    right side   Diabetes mellitus without complication (HCC)    borderline   Early cataracts, bilateral    Environmental and seasonal allergies    GERD  (gastroesophageal reflux disease)    Hypertension    PMH:   On home oxygen therapy    pt is no longer  using oxygen at night   Seizures (HCC)    haven't had a seizure since brain surgery in 2001   Uterine cancer (HCC)     Family History: Family History  Problem Relation Age of Onset   Cancer Mother    Diabetes Father    Heart failure Father    Depression Sister    Stroke Sister    Depression Sister    Breast cancer Maternal Aunt    Cancer Brother    Cancer Brother    Cancer Brother    Diabetes Other     Social History   Socioeconomic History   Marital status: Widowed    Spouse name: Not on file   Number of children: Not on file   Years of education: Not on file   Highest education level: Not on file  Occupational History   Not on file  Tobacco Use   Smoking status: Former    Types: Cigarettes   Smokeless tobacco: Never   Tobacco comments:    quit smoking cigarettes 34 years ago  Vaping Use   Vaping Use: Never used  Substance and Sexual Activity   Alcohol use: No   Drug use: No   Sexual activity: Not on file  Other Topics Concern   Not on file  Social History Narrative   Not on file   Social Determinants of Health   Financial Resource Strain: Low Risk  (03/15/2021)   Overall Financial Resource Strain (CARDIA)    Difficulty of Paying Living Expenses: Not hard at all  Food Insecurity: No Food Insecurity (03/15/2021)   Hunger Vital Sign    Worried About Running Out of Food in the Last Year: Never true    Ran Out of Food in the Last Year: Never true  Transportation Needs: No Transportation Needs (03/15/2021)   PRAPARE - Transportation    Lack of Transportation (Medical): No    Lack of Transportation (Non-Medical): No  Physical Activity: Not on file  Stress: Not on file  Social Connections: Not on file  Intimate Partner Violence: Not on file      Review of Systems  Constitutional:  Negative for activity change, appetite change, chills, fatigue, fever and  unexpected weight change.  HENT: Negative.  Negative for congestion, ear pain, rhinorrhea, sore throat and trouble swallowing.   Eyes: Negative.   Respiratory: Negative.  Negative for cough, chest tightness, shortness of breath and wheezing.   Cardiovascular: Negative.  Negative for chest pain.  Gastrointestinal: Negative.  Negative for abdominal pain, blood in stool, constipation, diarrhea, nausea and vomiting.  Endocrine: Negative.   Genitourinary: Negative.  Negative for difficulty urinating, dysuria, frequency, hematuria and urgency.  Musculoskeletal: Negative.  Negative for arthralgias, back pain, joint swelling, myalgias and neck pain.  Skin: Negative.  Negative for rash and wound.  Allergic/Immunologic: Negative.  Negative for immunocompromised state.    Neurological: Negative.  Negative for dizziness, seizures, numbness and headaches.  Hematological: Negative.   Psychiatric/Behavioral: Negative.  Negative for behavioral problems, self-injury and suicidal ideas. The patient is not nervous/anxious.     Vital Signs: BP 138/72 Comment: 168/87  Pulse 63   Temp (!) 97.2 F (36.2 C)   Resp 16   Ht 5' 1" (1.549 m)   Wt 235 lb 12.8 oz (107 kg)   SpO2 96%   BMI 44.55 kg/m    Physical Exam Vitals reviewed.  Constitutional:      General: She is awake. She is not in acute distress.    Appearance: Normal appearance. She is well-developed and well-groomed. She is obese. She is not ill-appearing or diaphoretic.  HENT:     Head: Normocephalic and atraumatic.     Right Ear: Tympanic membrane, ear canal and external ear normal.     Left Ear: Tympanic membrane, ear canal and external ear normal.     Nose: Nose normal. No congestion or rhinorrhea.     Mouth/Throat:     Lips: Pink.     Mouth: Mucous membranes are moist.     Pharynx: Oropharynx is clear. Uvula midline. No oropharyngeal exudate or posterior oropharyngeal erythema.  Eyes:     General: Lids are normal. Vision grossly intact.  Gaze aligned appropriately. No scleral icterus.       Right eye: No discharge.        Left eye: No discharge.     Extraocular Movements: Extraocular movements intact.     Conjunctiva/sclera: Conjunctivae normal.     Pupils: Pupils are equal, round, and reactive to light.     Funduscopic exam:    Right eye: Red reflex present.        Left eye: Red reflex present. Neck:     Thyroid: No thyromegaly.     Vascular: No JVD.     Trachea: Trachea normal. No tracheal deviation.  Cardiovascular:     Rate and Rhythm: Normal rate and regular rhythm.     Pulses: Normal pulses.     Heart sounds: Normal heart sounds, S1 normal and S2 normal. No murmur heard.    No friction rub. No gallop.  Pulmonary:     Effort: Pulmonary effort is normal. No accessory muscle usage or respiratory distress.     Breath sounds: Normal breath sounds and air entry. No stridor. No wheezing or rales.  Chest:     Chest wall: No tenderness.     Comments: Declined clinical breast exam, gets annual mammograms.  Abdominal:     General: Bowel sounds are normal. There is no distension.     Palpations: Abdomen is soft. There is no shifting dullness, fluid wave, mass or pulsatile mass.     Tenderness: There is no abdominal tenderness. There is no guarding or rebound.  Musculoskeletal:        General: No tenderness or deformity. Normal range of motion.     Cervical back: Normal range of motion and neck supple.  Lymphadenopathy:     Cervical: No cervical adenopathy.  Skin:    General: Skin is warm and dry.     Capillary Refill: Capillary refill takes less than 2 seconds.     Coloration: Skin is not pale.     Findings: No erythema or rash.  Neurological:     Mental Status: She is alert and oriented to person, place, and time.     Cranial Nerves: No cranial nerve deficit.       Motor: No abnormal muscle tone.     Coordination: Coordination normal.     Deep Tendon Reflexes: Reflexes are normal and symmetric.  Psychiatric:         Mood and Affect: Mood and affect normal.        Behavior: Behavior normal. Behavior is cooperative.        Thought Content: Thought content normal.        Judgment: Judgment normal.        Assessment/Plan: 1. Encounter for routine adult health examination with abnormal findings Age-appropriate preventive screenings and vaccinations discussed, annual physical exam completed. Routine labs for health maintenance ordered, see below. PHM updated.  - CBC with Differential/Platelet - CMP14+EGFR - Lipid Profile - Vitamin D (25 hydroxy)  2. Type 2 diabetes mellitus with hyperglycemia, without long-term current use of insulin (HCC) A1c elevated at 7.1, rybelsus discontinued, glipizide XL ordered, take as prescribed, voucher given for glucose meter, instructed patient to check her sugar fasting at least 3 times weekly. Routine labs also ordered - POCT glycosylated hemoglobin (Hb A1C) - glipiZIDE (GLUCOTROL XL) 5 MG 24 hr tablet; Take 1 tablet (5 mg total) by mouth daily with breakfast.  Dispense: 90 tablet; Refill: 1 - CBC with Differential/Platelet - CMP14+EGFR  3. Mixed hyperlipidemia Routine labs ordered - CBC with Differential/Platelet - Lipid Profile  4. Vitamin D deficiency Routine labs ordered - CBC with Differential/Platelet - Vitamin D (25 hydroxy)  5. Dysuria Routine urinalysis done - UA/M w/rflx Culture, Routine - Microscopic Examination      General Counseling: Jalie verbalizes understanding of the findings of todays visit and agrees with plan of treatment. I have discussed any further diagnostic evaluation that may be needed or ordered today. We also reviewed her medications today. she has been encouraged to call the office with any questions or concerns that should arise related to todays visit.    Orders Placed This Encounter  Procedures   Microscopic Examination   CBC with Differential/Platelet   CMP14+EGFR   Lipid Profile   Vitamin D (25 hydroxy)    UA/M w/rflx Culture, Routine   POCT glycosylated hemoglobin (Hb A1C)    Meds ordered this encounter  Medications   glipiZIDE (GLUCOTROL XL) 5 MG 24 hr tablet    Sig: Take 1 tablet (5 mg total) by mouth daily with breakfast.    Dispense:  90 tablet    Refill:  1    Discontinue rybelsus, fill new script today    No follow-ups on file.   Total time spent:30 Minutes Time spent includes review of chart, medications, test results, and follow up plan with the patient.   [place] Controlled Substance Database was reviewed by me.  This patient was seen by Jonetta Osgood, FNP-C in collaboration with Dr. Clayborn Bigness as a part of collaborative care agreement.  Daralyn Bert R. Valetta Fuller, MSN, FNP-C Internal medicine

## 2021-11-25 LAB — UA/M W/RFLX CULTURE, ROUTINE
Bilirubin, UA: NEGATIVE
Glucose, UA: NEGATIVE
Ketones, UA: NEGATIVE
Leukocytes,UA: NEGATIVE
Nitrite, UA: NEGATIVE
Protein,UA: NEGATIVE
RBC, UA: NEGATIVE
Specific Gravity, UA: 1.021 (ref 1.005–1.030)
Urobilinogen, Ur: 0.2 mg/dL (ref 0.2–1.0)
pH, UA: 7.5 (ref 5.0–7.5)

## 2021-11-25 LAB — CBC WITH DIFFERENTIAL/PLATELET
Basophils Absolute: 0 10*3/uL (ref 0.0–0.2)
Basos: 1 %
EOS (ABSOLUTE): 0.2 10*3/uL (ref 0.0–0.4)
Eos: 4 %
Hematocrit: 46.2 % (ref 34.0–46.6)
Hemoglobin: 15 g/dL (ref 11.1–15.9)
Immature Grans (Abs): 0 10*3/uL (ref 0.0–0.1)
Immature Granulocytes: 0 %
Lymphocytes Absolute: 1.3 10*3/uL (ref 0.7–3.1)
Lymphs: 24 %
MCH: 29.2 pg (ref 26.6–33.0)
MCHC: 32.5 g/dL (ref 31.5–35.7)
MCV: 90 fL (ref 79–97)
Monocytes Absolute: 0.5 10*3/uL (ref 0.1–0.9)
Monocytes: 9 %
Neutrophils Absolute: 3.3 10*3/uL (ref 1.4–7.0)
Neutrophils: 62 %
Platelets: 289 10*3/uL (ref 150–450)
RBC: 5.13 x10E6/uL (ref 3.77–5.28)
RDW: 13.3 % (ref 11.7–15.4)
WBC: 5.3 10*3/uL (ref 3.4–10.8)

## 2021-11-25 LAB — MICROSCOPIC EXAMINATION
Bacteria, UA: NONE SEEN
Casts: NONE SEEN /lpf
RBC, Urine: NONE SEEN /hpf (ref 0–2)
WBC, UA: NONE SEEN /hpf (ref 0–5)

## 2021-11-25 LAB — CMP14+EGFR
ALT: 13 IU/L (ref 0–32)
AST: 25 IU/L (ref 0–40)
Albumin/Globulin Ratio: 1.4 (ref 1.2–2.2)
Albumin: 4 g/dL (ref 3.8–4.8)
Alkaline Phosphatase: 118 IU/L (ref 44–121)
BUN/Creatinine Ratio: 19 (ref 12–28)
BUN: 15 mg/dL (ref 8–27)
Bilirubin Total: 0.3 mg/dL (ref 0.0–1.2)
CO2: 23 mmol/L (ref 20–29)
Calcium: 9.4 mg/dL (ref 8.7–10.3)
Chloride: 101 mmol/L (ref 96–106)
Creatinine, Ser: 0.78 mg/dL (ref 0.57–1.00)
Globulin, Total: 2.9 g/dL (ref 1.5–4.5)
Glucose: 123 mg/dL — ABNORMAL HIGH (ref 70–99)
Potassium: 4.8 mmol/L (ref 3.5–5.2)
Sodium: 138 mmol/L (ref 134–144)
Total Protein: 6.9 g/dL (ref 6.0–8.5)
eGFR: 80 mL/min/{1.73_m2} (ref >59)

## 2021-11-25 LAB — LIPID PANEL
Chol/HDL Ratio: 3.3 ratio (ref 0.0–4.4)
Cholesterol, Total: 168 mg/dL (ref 100–199)
HDL: 51 mg/dL (ref >39)
LDL Chol Calc (NIH): 95 mg/dL (ref 0–99)
Triglycerides: 126 mg/dL (ref 0–149)
VLDL Cholesterol Cal: 22 mg/dL (ref 5–40)

## 2021-11-25 LAB — VITAMIN D 25 HYDROXY (VIT D DEFICIENCY, FRACTURES): Vit D, 25-Hydroxy: 29.4 ng/mL — ABNORMAL LOW (ref 30.0–100.0)

## 2021-12-04 ENCOUNTER — Ambulatory Visit
Admission: RE | Admit: 2021-12-04 | Discharge: 2021-12-04 | Disposition: A | Discharge status: Home / Self Care | Payer: Medicare HMO | Source: Ambulatory Visit | Attending: Radiation Oncology | Admitting: Radiation Oncology

## 2021-12-04 ENCOUNTER — Telehealth: Payer: Self-pay

## 2021-12-04 ENCOUNTER — Encounter: Payer: Self-pay | Admitting: Radiation Oncology

## 2021-12-04 VITALS — BP 158/100 | HR 74 | Temp 96.7°F | Resp 16 | Ht 60.0 in | Wt 233.0 lb

## 2021-12-04 DIAGNOSIS — Z923 Personal history of irradiation: Secondary | ICD-10-CM | POA: Insufficient documentation

## 2021-12-04 DIAGNOSIS — C541 Malignant neoplasm of endometrium: Secondary | ICD-10-CM | POA: Diagnosis not present

## 2021-12-04 DIAGNOSIS — Z8542 Personal history of malignant neoplasm of other parts of uterus: Secondary | ICD-10-CM | POA: Diagnosis not present

## 2021-12-04 NOTE — Progress Notes (Signed)
Radiation Oncology Follow up Note  Name: Cassidy Parsons   Date:   12/04/2021 MRN:  [id number] DOB: Sep 21, 1945    This 76 y.o. female presents to the clinic today for 2-year follow-up status post whole pelvic radiation therapy for stage Ia mixed clear-cell endometrial carcinoma status post TLH BSO.  REFERRING PROVIDER: Jonetta Osgood, NP  HPI: Patient is a 76 year old female now out over 2 years having completed whole pelvic radiation therapy for stage Ia mixed clear-cell endometrial carcinoma status post TLH BSO.  Seen today in routine follow-up she is doing well.  She specifically denies any vaginal discharge or bleeding.  She is having no abdominal pain or increased lower urinary tract symptoms or significant diarrhea..  CT scan back in May showed no evidence of metastatic disease or any evidence of pelvic or abdominal recurrence.  Patient does have chest nodules which have been stable over time.  Dr. Theora Gianotti has recommended continued surveillance with given resolution of retroperitoneal lymph nodes and findings of by CT exam.  There is still some concern about a nodule near the IVC and this is good to be followed up on continued CT surveillance. COMPLICATIONS OF TREATMENT: none  FOLLOW UP COMPLIANCE: keeps appointments   PHYSICAL EXAM:  BP (!) 158/100 (BP Location: Left Wrist, Patient Position: Sitting, Cuff Size: Small) Comment: patient states her blood pressure is usually normal she will monitor it at home.  Pulse 74   Temp (!) 96.7 F (35.9 C) (Tympanic)   Resp 16   Ht 5' (1.524 m)   Wt 233 lb (105.7 kg)   BMI 45.50 kg/m  Well-developed well-nourished patient in NAD. HEENT reveals PERLA, EOMI, discs not visualized.  Oral cavity is clear. No oral mucosal lesions are identified. Neck is clear without evidence of cervical or supraclavicular adenopathy. Lungs are clear to A&P. Cardiac examination is essentially unremarkable with regular rate and rhythm without murmur rub or thrill.  Abdomen is benign with no organomegaly or masses noted. Motor sensory and DTR levels are equal and symmetric in the upper and lower extremities. Cranial nerves II through XII are grossly intact. Proprioception is intact. No peripheral adenopathy or edema is identified. No motor or sensory levels are noted. Crude visual fields are within normal range.  RADIOLOGY RESULTS: CT scan reviewed compatible with above-stated findings  PLAN: Time patient is now out over 2 years with no evidence of disease.  I am going to turn follow-up care over to GYN oncology.  I be happy to reevaluate this patient anytime should that be indicated.  Patient knows to call with any concerns.  I would like to take this opportunity to thank you for allowing me to participate in the care of your patient.Noreene Filbert, MD

## 2021-12-28 NOTE — Telephone Encounter (Signed)
done

## 2022-01-24 ENCOUNTER — Inpatient Hospital Stay: Payer: Medicare HMO | Attending: Obstetrics and Gynecology | Admitting: Obstetrics and Gynecology

## 2022-01-24 VITALS — BP 165/87 | HR 77 | Temp 98.6°F | Resp 18 | Wt 238.7 lb

## 2022-01-24 DIAGNOSIS — I10 Essential (primary) hypertension: Secondary | ICD-10-CM | POA: Insufficient documentation

## 2022-01-24 DIAGNOSIS — C541 Malignant neoplasm of endometrium: Secondary | ICD-10-CM | POA: Insufficient documentation

## 2022-01-24 DIAGNOSIS — L309 Dermatitis, unspecified: Secondary | ICD-10-CM | POA: Diagnosis not present

## 2022-01-24 DIAGNOSIS — Z90710 Acquired absence of both cervix and uterus: Secondary | ICD-10-CM | POA: Insufficient documentation

## 2022-01-24 DIAGNOSIS — E119 Type 2 diabetes mellitus without complications: Secondary | ICD-10-CM

## 2022-01-24 DIAGNOSIS — E1136 Type 2 diabetes mellitus with diabetic cataract: Secondary | ICD-10-CM | POA: Insufficient documentation

## 2022-01-24 DIAGNOSIS — Z87891 Personal history of nicotine dependence: Secondary | ICD-10-CM | POA: Insufficient documentation

## 2022-01-24 DIAGNOSIS — Z803 Family history of malignant neoplasm of breast: Secondary | ICD-10-CM | POA: Diagnosis not present

## 2022-01-24 DIAGNOSIS — R918 Other nonspecific abnormal finding of lung field: Secondary | ICD-10-CM | POA: Diagnosis not present

## 2022-01-24 NOTE — Progress Notes (Signed)
Gynecologic Oncology Interval Visit   Referring Provider: Dr. Kenton Kingfisher  Chief Complaint: 1A mixed clear cell/endometrioid endometrial carcinoma the endometrium status post TLH/BSO, radiation  Subjective:  Cassidy Parsons is a 77 y.o. 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 Initial pathology from endometrial biopsy was reviewed at [facility] and confirmed clear cell carcinoma. She returns to clinic for continued surveillance.    She was last seen October 2023. She saw Dr. Baruch Gouty in 11/2021 and had a negative exam. She was released from [place].      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  Patient had ~ 5 year history of PMB, worse since May 2021 after she fell on her stomach. On 07-Aug-2022 underwent endometrial biopsy. - Clear cell carcinoma, FIGO grade 3  Pap 07-Aug-2019- NILM  Husband died in March 25, 2022 and she delayed self care because she was taking care of him.   CT scan C/A/P 08/07/19 did not show metastatic disease. Impression:  1. Heterogeneous and thickened appearance of the endometrium in keeping with patients known primary malignancy.  2. No definite evidence of metastatic disease in the chest, abdomen, or pelvis.  3. Bilateral pulmonary nodules, nonspecific. Recommend continued attention on follow-up.  4. Mild thickening of the sigmoid colon and tethering of the superior bladder wall, favored sequela of prior diverticulitis.   Underwent TLH, BSx/RO SLN mapping at [facility] 08/11/19.  Grade 2 endometrioid 4.2 cm cancer with 26% invasion.  Negative right adnexa and cervix and no LVSI.  Left adnexa could not be removed due to dense adhesions of sigmoid colon secondary to severe diverticular disease.   Comment:  The tumor demonstrates foci of well-formed endometrioid glands with defined nuclear polarity, but a considerable portion of the tumor appears solid, with large areas of squamous differentiation.  Morphologic features of clear cell carcinoma, such as marked nuclear atypia, hobnail architecture, hyalinized stroma, and intracytoplasmic eosinophilic globules, are not identified.  Secretory change is present in some cells, which stain positive for the markers Napsin A and AMACR.  The reported history of a prior biopsy with clear cell carcinoma is noted.  Review of this biopsy may be beneficial to exclude the possibility of a mixed clear cell/endometrioid tumor, if this diagnosis would influence patient management.  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   Slides were reviewed at [facility]. Dr. Fransisca Connors recommended adjuvant WPRT.   10/20/19-11/23/19 completed WPRT. Suffered diarrhea and fatigue during treatment and she has a h/o urinary frequency unrelieved by oxybutynin.   History of pulmonary nodules:  Previous imaging showed bilateral pulmonary nodules, 2-3 mm nonspecific on CT scan. Per patient, results were reassuring. [facility] radiology regarding read on 07/18/20 CT scan. No further follow up needed given stability.   09/19/2020 CT ABDOMEN AND PELVIS WITH CONTRAST  Soft tissue nodule at the right aspect of the aorta, anterior to the right ureter, measuring up to 10 x 8 x 24 mm (series 2, image 49 and series 6, image 118), which does not fill with contrast on the delayed sequence.   12/26/20 CT ABDOMEN AND PELVIS WITH CONTRAST Enlarged visualized portions of great saphenous veins with multiple associated distended anterior pelvic wall veins. 2.6 x 1.2 cm enlarged right retroperitoneal nodule or lymph node anterior to  the mid ureter (5:59 and 2:51), previously 2.4 x 1. No new adenopathy.   01/12/21  PET 1. Soft tissue stranding of the right retroperitoneum with mild FDG  uptake, likely postprocedural changes. Previously described enlarged right retroperitoneal lymph node is obscured and FDG avidity cannot be characterized. 2. No additional sites of hypermetabolic activity to suggest metastatic disease. 3. Sclerosis of the sacrum with associated linear lucencies and mild FDG uptake, findings are similar to prior exams and likely due to chronic sacral insufficiency fractures. 4.  Aortic Atherosclerosis (ICD10-I70.0).  06/02/21 Follow up CT was negative.  1. No evidence of metastatic disease in the abdomen or pelvis. 2. Cholelithiasis.    She had mammogram at [facility] performed on 05/31/21 which showed some abnormality of right breast. She had diagnostic imaging on 07/07/21 which was reported as benign appearing. She declines additional colonoscopy.    She traveled to [place] for grandson's wedding in 2023 and also went to [place] for celebration of life.    Problem List: Patient Active Problem List   Diagnosis Date Noted   Primary clear cell adenocarcinoma of endometrium ([place]) 12/23/2019   Morbid obesity with BMI of 45.0-49.9, adult ([place]) 08/12/2019   Urinary tract infection without hematuria 08/07/2018   Dysuria 08/07/2018   Osteoarthritis of knee 04/11/2017   Low back pain 02/05/2017   Mild persistent asthma, uncomplicated 54/62/7035   Rhinitis, allergic 02/05/2017   GAD (generalized anxiety disorder) 02/05/2017   Unspecified complication of internal orthopedic prosthetic device, implant and graft, initial encounter ([place]) 02/05/2017   Diabetes mellitus without complication ([place]) 00/93/8182   Gout, unspecified 02/05/2017   Occlusion and stenosis of bilateral carotid arteries 02/05/2017   Insomnia, unspecified 02/05/2017   Sleep apnea 02/05/2017   Elevated blood-pressure reading without diagnosis of hypertension 02/05/2017   Dupuytren's disease of palm 10/05/2016   Exposed orthopaedic hardware ([place]) 02/10/2015    Past Medical History: Past Medical  History:  Diagnosis Date   Asthma    Brain tumor (benign) ([place])    right side   Diabetes mellitus without complication ([place])    borderline   Early cataracts, bilateral    Environmental and seasonal allergies    GERD (gastroesophageal reflux disease)    Hypertension    PMH:   On home oxygen therapy    pt is no longer  using oxygen at night   Seizures ([place])    haven't had a seizure since brain surgery in 2001   Uterine cancer ([place])     Past Surgical History: Past Surgical History:  Procedure Laterality Date   BRAIN SURGERY     BREAST BIOPSY     BREAST SURGERY     biopsy right breast   CRANIOTOMY N/A 02/10/2015   Procedure: Removal of cranial plate with scalp wound revision;  Surgeon: Earnie Larsson, MD;  Location: [facility];  Service: Neurosurgery;  Laterality: N/A;   DIAGNOSTIC LAPAROSCOPY     panniculectomy   PARTIAL HYSTERECTOMY     TONSILLECTOMY      Past Gynecologic History:  Menarche: age [age] Post menopausal Hx of post menopausal bleeding    OB History:  OB History  Gravida Para Term Preterm AB Living  '2 1 1   1 1  '$ SAB IAB Ectopic Multiple Live Births               # Outcome Date GA Lbr Len/2nd Weight Sex Delivery Anes PTL Lv  2 AB           1  Term            Family History: Family History  Problem Relation Age of Onset   Cancer Mother    Diabetes Father    Heart failure Father    Depression Sister    Stroke Sister    Depression Sister    Breast cancer Maternal Aunt    Cancer Brother    Cancer Brother    Cancer Brother    Diabetes Other    Social History: Social History   Socioeconomic History   Marital status: Widowed    Spouse name: Not on file   Number of children: Not on file   Years of education: Not on file   Highest education level: Not on file  Occupational History   Not on file  Tobacco Use   Smoking status: Former    Types: Cigarettes   Smokeless tobacco: Never   Tobacco comments:    quit smoking cigarettes 34 years ago   Vaping Use   Vaping Use: Never used  Substance and Sexual Activity   Alcohol use: No   Drug use: No   Sexual activity: Not on file  Other Topics Concern   Not on file  Social History Narrative   Not on file   Social Determinants of Health   Financial Resource Strain: Low Risk  (03/15/2021)   Overall Financial Resource Strain (CARDIA)    Difficulty of Paying Living Expenses: Not hard at all  Food Insecurity: No Food Insecurity (03/15/2021)   Hunger Vital Sign    Worried About Running Out of Food in the Last Year: Never true    [place] in the Last Year: Never true  Transportation Needs: No Transportation Needs (03/15/2021)   PRAPARE - Hydrologist (Medical): No    Lack of Transportation (Non-Medical): No  Physical Activity: Not on file  Stress: Not on file  Social Connections: Not on file  Intimate Partner Violence: Not on file   Immunization History  Administered Date(s) Administered   Influenza-Unspecified 10/22/2016, 09/21/2019, 10/10/2020, 10/19/2021   Moderna SARS-COV2 Booster Vaccination 12/15/2019, 11/04/2020, 10/25/2021   Moderna Sars-Covid-2 Vaccination 03/02/2019, 03/31/2019, 06/28/2020   Pneumococcal Conjugate-13 07/23/2015   Pneumococcal Polysaccharide-23 02/18/2018   Zoster, Live 10/16/2012   Zoster, Unspecified 10/16/2012   Allergies: Allergies  Allergen Reactions   Dust Mite Extract    Mold Extract  [Trichophyton]    Molds & Smuts Other (See Comments)   Pollen Extract    Roxanol [Morphine] Other (See Comments)    Extreme pain   Meperidine Rash    Itching   Meperidine And Related Itching and Rash   Other Itching, Rash and Other (See Comments)    All pain relievers except hydrocodone Trees, mold, dust, and perfume allergies (sneezing, eyes swell up)   Propoxyphene Itching and Rash   Tape Itching and Rash    With paper tape.  Please use adhesive tape Rash   Tramadol Itching and Rash    Current Medications: Current  Outpatient Medications  Medication Sig Dispense Refill   Accu-Chek Softclix Lancets lancets Use as instructed once a daily DX E11.65 100 each 0   allopurinol (ZYLOPRIM) 100 MG tablet TAKE (1) TABLET BY MOUTH EVERY DAY 90 tablet 1   aspirin EC 81 MG tablet Take 81 mg by mouth daily.     atorvastatin (LIPITOR) 10 MG tablet Take  1 tab po twice a week 90 tablet 1   azelastine (ASTELIN) 0.1 % nasal  spray PLACE 1 SPRAY INTO EACH NOSTRIL DAILY ASDIRECTED. 30 mL 3   Blood Glucose Monitoring Suppl (ACCU-CHEK GUIDE) w/Device KIT Use as directed Dx E11.65 1 kit 0   Calcium Carb-Cholecalciferol 600-200 MG-UNIT TABS Take 1 tablet by mouth daily.     cetirizine (ZYRTEC) 10 MG tablet Take 10 mg by mouth daily.     clobetasol (TEMOVATE) 0.05 % external solution Apply 1 application. topically 2 (two) times daily. 50 mL 3   clotrimazole-betamethasone (LOTRISONE) cream Apply 1 Application topically daily. 45 g 5   esomeprazole (NEXIUM) 40 MG capsule Take 40 mg by mouth daily at 12 noon.     fluticasone (FLONASE) 50 MCG/ACT nasal spray Place 1 spray into both nostrils daily. 16 g 3   glipiZIDE (GLUCOTROL XL) 5 MG 24 hr tablet Take 1 tablet (5 mg total) by mouth daily with breakfast. 90 tablet 1   glucose blood (ACCU-CHEK GUIDE) test strip Use as instructed once a daily  DX E11.65 100 each 1   ketoconazole (NIZORAL) 2 % shampoo APPLY 1 APPLICATION TOPICALLY TWICE A WEEK. 120 mL 3   montelukast (SINGULAIR) 10 MG tablet TAKE 1 TABLET BY MOUTH DAILY AT BEDTIME 90 tablet 1   Multiple Vitamins-Minerals (MULTIVITAMIN ADULT PO) Take 1 tablet by mouth daily.     nystatin (MYCOSTATIN/NYSTOP) powder Apply 1 application. topically 3 (three) times daily as needed. 60 g 0   oxybutynin (DITROPAN) 5 MG tablet Take 1 tablet (5 mg total) by mouth daily. 90 tablet 0   No current facility-administered medications for this visit.    Review of Systems General:  no complaints Skin: no complaints Eyes: no complaints HEENT: no  complaints Breasts: no complaints Pulmonary: no complaints Cardiac: no complaints Gastrointestinal: no complaints Genitourinary/Sexual: no complaints Ob/Gyn: no complaints Musculoskeletal: no complaints Hematology: no complaints Neurologic/Psych: no complaints   Objective:  Physical Examination:  BP (!) 165/87   Pulse 77   Temp 98.6 F (37 C)   Resp 18   Wt 238 lb 11.2 oz (108.3 kg)   SpO2 97%   BMI 46.62 kg/m    ECOG Performance Status: 1 - Symptomatic but completely ambulatory  GENERAL: elderly appearing female in no acute distress HEENT:  Sclera clear. Anicteric NODES:  Negative axillary, supraclavicular, inguinal lymph node survery LUNGS:  Clear to auscultation bilaterally.   HEART:  Regular rate and rhythm.  ABDOMEN:  Soft, nontender.  No hernias, incisions well healed. No masses or ascites.  EXTREMITIES:  No peripheral edema. Atraumatic. No cyanosis SKIN:  Skin maceration at skin folds, pannus/inguinal area stable NEURO:  Nonfocal. Well oriented.  Appropriate affect.   Pelvic: exam chaperoned by CMA;   Vulva: normal appearing vulva with no masses, tenderness or lesions Vagina: normal vagina o/t 1.5-2 cm mass mid cuff to right vaginal fornix which is stable and nontender Cervix: surgically absent Uterus: surgically absent BME: no adnexal masses; see vaginal cuff findings as noted above  Assessment:  KIMEKA BADOUR is a 77 y.o. female diagnosed with 1A mixed clear cell/endometrioid endometrial carcinoma (MSI; dMMR with loss of MLH1/PMS2 with methylated MLH1 alleles present) s/p TLH, RSO 08/11/19. SLN mapping unsuccessful and left adnexa could not be removed due to severe adhesions from diverticular disease.  No LVSI, or cervical/adnexal involvement and invades 23% of the myometrium. Pre-op CT scan negative for metastatic disease. Treated with whole pelvic radiation 11/21.  Possible vaginal cuff mass, CT 09/2020 showed 2.4 nodule thought to represent lymph node, near  IVC, above field of  RT. CT 12/2020 increased to 2.6 cm. Biopsy was negative though no lymphatic tissue present. PET scan showed stranding of right retroperitoneum, mildly FDG active. Retroperitoneal LN was obscured and could not be characterized. Follow up CT 06/02/21 was negative. She is asymptomatic and clinically NED. Exam stable1.5-2 cm vaginal cuff mass possible postop change. Pelvic exams are very difficult for her due to severe back pain with positioning. Recommend continued surveillance.   Pulmonary nodules, 07/2019 on CT chest, on follow up CT at [facility] 2022 report stable. No additional imaging was recommended.   Intertrigous dermatitis, stable  Medical co-morbidities complicating care: HTN, morbid obesity (Body mass index is 46.62 kg/m.), prediabetes, possible h/o carotid stenosis.  Plan:   Problem List Items Addressed This Visit       Genitourinary   Primary clear cell adenocarcinoma of endometrium ([place]) - Primary   Given resolution of possible retroperitoneal lymph node and findings of persistent vaginal cuff mass, recommend continued surveillance and no additional imaging at this time unless concerning findings on exam or symptoms. She is concerned about cost of imaging studies. Patient agreeable. Vaginal findings are stable.   Recommend follow up/surveillance with pelvic exam in 6 months due to difficulty with exams. If recurrent disease, she would be a candidate for chemotherapy or pembrolizumab/immunotherapy.   Intertrigous dermatitis- follow up with her PCP.   I personally had a face to face interaction and evaluated the patient.  I have reviewed her history and available records and have performed the entire physical exam. The assessment and plan which was fully formulated by me.  Counseling was completed by me. Beckey Rutter, NP scribed the note.   Yianni Skilling Gaetana Michaelis, MD

## 2022-02-02 ENCOUNTER — Telehealth: Payer: Self-pay

## 2022-02-02 NOTE — Telephone Encounter (Signed)
Pt called that her she is having right side of neck swelling that is the size of the palm of her hand. no fever,no sob and no difficulty swallowing as per pt. Advised to go to ED due to acute swelling as per lauren and also advised by lauren as well

## 2022-02-20 ENCOUNTER — Other Ambulatory Visit: Payer: Self-pay | Admitting: Nurse Practitioner

## 2022-02-26 ENCOUNTER — Ambulatory Visit (INDEPENDENT_AMBULATORY_CARE_PROVIDER_SITE_OTHER): Payer: Medicare HMO | Admitting: Nurse Practitioner

## 2022-02-26 ENCOUNTER — Encounter: Payer: Self-pay | Admitting: Nurse Practitioner

## 2022-02-26 VITALS — BP 140/70 | Temp 97.7°F | Resp 16 | Ht 61.0 in | Wt 237.2 lb

## 2022-02-26 DIAGNOSIS — E1165 Type 2 diabetes mellitus with hyperglycemia: Secondary | ICD-10-CM | POA: Diagnosis not present

## 2022-02-26 DIAGNOSIS — R030 Elevated blood-pressure reading, without diagnosis of hypertension: Secondary | ICD-10-CM

## 2022-02-26 DIAGNOSIS — E782 Mixed hyperlipidemia: Secondary | ICD-10-CM | POA: Diagnosis not present

## 2022-02-26 LAB — POCT GLYCOSYLATED HEMOGLOBIN (HGB A1C): Hemoglobin A1C: 6.8 % — AB (ref 4.0–5.6)

## 2022-02-26 NOTE — Progress Notes (Signed)
[facility] [place], [place] [address]  Internal MEDICINE  Office Visit Note  Patient Name: Cassidy Parsons  [id number]  [id number]  Date of Service: 02/26/2022  Chief Complaint  Patient presents with   Gastroesophageal Reflux   Diabetes   Hypertension    HPI Mollyann presents for a follow-up visit for diabetes, hypertension and med refills.  Diabetes -- A1c is 6.8, improving  Hypertension -- BP elevated, improved when rechecked Does not need any refills.     Current Medication: Outpatient Encounter Medications as of 02/26/2022  Medication Sig   Accu-Chek Softclix Lancets lancets Use as instructed once a daily DX E11.65   allopurinol (ZYLOPRIM) 100 MG tablet TAKE (1) TABLET BY MOUTH EVERY DAY   aspirin EC 81 MG tablet Take 81 mg by mouth daily.   atorvastatin (LIPITOR) 10 MG tablet Take  1 tab po twice a week   azelastine (ASTELIN) 0.1 % nasal spray PLACE 1 SPRAY INTO EACH NOSTRIL DAILY ASDIRECTED.   Blood Glucose Monitoring Suppl (ACCU-CHEK GUIDE) w/Device KIT Use as directed Dx E11.65   Calcium Carb-Cholecalciferol 600-200 MG-UNIT TABS Take 1 tablet by mouth daily.   cetirizine (ZYRTEC) 10 MG tablet Take 10 mg by mouth daily.   clobetasol (TEMOVATE) 0.05 % external solution Apply 1 application. topically 2 (two) times daily.   clotrimazole-betamethasone (LOTRISONE) cream Apply 1 Application topically daily.   esomeprazole (NEXIUM) 40 MG capsule Take 40 mg by mouth daily at 12 noon.   fluticasone (FLONASE) 50 MCG/ACT nasal spray Place 1 spray into both nostrils daily.   glipiZIDE (GLUCOTROL XL) 5 MG 24 hr tablet Take 1 tablet (5 mg total) by mouth daily with breakfast.   glucose blood (ACCU-CHEK GUIDE) test strip Use as instructed once a daily  DX E11.65   ketoconazole (NIZORAL) 2 % shampoo APPLY 1 APPLICATION TOPICALLY TWICE A WEEK.   montelukast (SINGULAIR) 10 MG tablet TAKE 1 TABLET BY MOUTH DAILY AT BEDTIME   Multiple Vitamins-Minerals  (MULTIVITAMIN ADULT PO) Take 1 tablet by mouth daily.   nystatin (MYCOSTATIN/NYSTOP) powder Apply 1 application. topically 3 (three) times daily as needed.   oxybutynin (DITROPAN) 5 MG tablet TAKE (1) TABLET BY MOUTH EVERY DAY   No facility-administered encounter medications on file as of 02/26/2022.    Surgical History: Past Surgical History:  Procedure Laterality Date   BRAIN SURGERY     BREAST BIOPSY     BREAST SURGERY     biopsy right breast   CRANIOTOMY N/A 02/10/2015   Procedure: Removal of cranial plate with scalp wound revision;  Surgeon: Earnie Larsson, MD;  Location: [facility];  Service: Neurosurgery;  Laterality: N/A;   DIAGNOSTIC LAPAROSCOPY     panniculectomy   PARTIAL HYSTERECTOMY     TONSILLECTOMY      Medical History: Past Medical History:  Diagnosis Date   Asthma    Brain tumor (benign) ([place])    right side   Diabetes mellitus without complication ([place])    borderline   Early cataracts, bilateral    Environmental and seasonal allergies    GERD (gastroesophageal reflux disease)    Hypertension    PMH:   On home oxygen therapy    pt is no longer  using oxygen at night   Seizures ([place])    haven't had a seizure since brain surgery in 2001   Uterine cancer ([place])     Family History: Family History  Problem Relation Age of Onset   Cancer Mother  Diabetes Father    Heart failure Father    Depression Sister    Stroke Sister    Depression Sister    Breast cancer Maternal Aunt    Cancer Brother    Cancer Brother    Cancer Brother    Diabetes Other     Social History   Socioeconomic History   Marital status: Widowed    Spouse name: Not on file   Number of children: Not on file   Years of education: Not on file   Highest education level: Not on file  Occupational History   Not on file  Tobacco Use   Smoking status: Former    Types: Cigarettes   Smokeless tobacco: Never   Tobacco comments:    quit smoking cigarettes 34 years ago  Vaping Use    Vaping Use: Never used  Substance and Sexual Activity   Alcohol use: No   Drug use: No   Sexual activity: Not on file  Other Topics Concern   Not on file  Social History Narrative   Not on file   Social Determinants of Health   Financial Resource Strain: Low Risk  (03/15/2021)   Overall Financial Resource Strain (CARDIA)    Difficulty of Paying Living Expenses: Not hard at all  Food Insecurity: No Food Insecurity (03/15/2021)   Hunger Vital Sign    Worried About Running Out of Food in the Last Year: Never true    [place] in the Last Year: Never true  Transportation Needs: No Transportation Needs (03/15/2021)   PRAPARE - Hydrologist (Medical): No    Lack of Transportation (Non-Medical): No  Physical Activity: Not on file  Stress: Not on file  Social Connections: Not on file  Intimate Partner Violence: Not on file      Review of Systems  Constitutional:  Negative for chills, fatigue and unexpected weight change.  HENT:  Negative for congestion, rhinorrhea, sneezing and sore throat.   Eyes:  Negative for redness.  Respiratory:  Negative for cough, chest tightness and shortness of breath.   Cardiovascular:  Negative for chest pain and palpitations.  Gastrointestinal:  Negative for abdominal pain, constipation, diarrhea, nausea and vomiting.  Genitourinary:  Negative for dysuria and frequency.  Musculoskeletal:  Negative for arthralgias, back pain, joint swelling and neck pain.  Skin:  Negative for rash.  Neurological: Negative.  Negative for tremors and numbness.  Hematological:  Negative for adenopathy. Does not bruise/bleed easily.  Psychiatric/Behavioral:  Negative for behavioral problems (Depression), sleep disturbance and suicidal ideas. The patient is not nervous/anxious.     Vital Signs: BP (!) 140/70 Comment: 156/84  Temp 97.7 F (36.5 C)   Resp 16   Ht '5\' 1"'$  (1.549 m)   Wt 237 lb 3.2 oz (107.6 kg)   SpO2 96%   BMI 44.82  kg/m    Physical Exam Vitals reviewed.  Constitutional:      General: She is not in acute distress.    Appearance: Normal appearance. She is obese. She is not ill-appearing.  HENT:     Head: Normocephalic and atraumatic.  Eyes:     Pupils: Pupils are equal, round, and reactive to light.  Cardiovascular:     Rate and Rhythm: Normal rate and regular rhythm.  Pulmonary:     Effort: Pulmonary effort is normal. No respiratory distress.  Neurological:     Mental Status: She is alert and oriented to person, place, and time.  Psychiatric:        Mood and Affect: Mood normal.        Behavior: Behavior normal.        Assessment/Plan: 1. Type 2 diabetes mellitus with hyperglycemia, without long-term current use of insulin (HCC) A1c is improving, keep up the good work. Follow up in 6 months for A1c recheck  - POCT glycosylated hemoglobin (Hb A1C) - Urine Microalbumin w/creat. ratio  2. White coat syndrome without diagnosis of hypertension BP is normal at home, but elevated in office. No medications and no hypertension. Continue to monitor  3. Mixed hyperlipidemia Continue atorvastatin as prescribed.    General Counseling: linzy alexandra understanding of the findings of todays visit and agrees with plan of treatment. I have discussed any further diagnostic evaluation that may be needed or ordered today. We also reviewed her medications today. she has been encouraged to call the office with any questions or concerns that should arise related to todays visit.    Orders Placed This Encounter  Procedures   Urine Microalbumin w/creat. ratio   POCT glycosylated hemoglobin (Hb A1C)    No orders of the defined types were placed in this encounter.   Return in about 6 months (around 08/27/2022) for F/U, Recheck A1C, Juliany Daughety PCP.   Total time spent:30 Minutes Time spent includes review of chart, medications, test results, and follow up plan with the patient.   [place] Controlled  Substance Database was reviewed by me.  This patient was seen by Jonetta Osgood, FNP-C in collaboration with Dr. Clayborn Bigness as a part of collaborative care agreement.   Fruma Africa R. Valetta Fuller, MSN, FNP-C Internal medicine

## 2022-02-27 LAB — MICROALBUMIN / CREATININE URINE RATIO
Creatinine, Urine: 137.3 mg/dL
Microalb/Creat Ratio: 5 mg/g creat (ref 0–29)
Microalbumin, Urine: 6.5 ug/mL

## 2022-03-01 NOTE — Progress Notes (Signed)
Microalbumin is normal

## 2022-03-04 ENCOUNTER — Encounter: Payer: Self-pay | Admitting: Nurse Practitioner

## 2022-03-09 ENCOUNTER — Other Ambulatory Visit: Payer: Self-pay | Admitting: Internal Medicine

## 2022-05-17 ENCOUNTER — Other Ambulatory Visit: Payer: Self-pay | Admitting: Internal Medicine

## 2022-05-17 ENCOUNTER — Other Ambulatory Visit: Payer: Self-pay | Admitting: Nurse Practitioner

## 2022-05-22 ENCOUNTER — Other Ambulatory Visit: Payer: Self-pay | Admitting: Nurse Practitioner

## 2022-06-07 ENCOUNTER — Other Ambulatory Visit: Payer: Self-pay | Admitting: Nurse Practitioner

## 2022-06-07 DIAGNOSIS — E1165 Type 2 diabetes mellitus with hyperglycemia: Secondary | ICD-10-CM

## 2022-06-28 ENCOUNTER — Encounter: Payer: Self-pay | Admitting: Nurse Practitioner

## 2022-06-28 ENCOUNTER — Ambulatory Visit (INDEPENDENT_AMBULATORY_CARE_PROVIDER_SITE_OTHER): Payer: Medicare HMO | Admitting: Nurse Practitioner

## 2022-06-28 VITALS — BP 138/85 | HR 69 | Temp 98.4°F | Resp 16 | Ht 61.0 in | Wt 244.6 lb

## 2022-06-28 DIAGNOSIS — R030 Elevated blood-pressure reading, without diagnosis of hypertension: Secondary | ICD-10-CM | POA: Diagnosis not present

## 2022-06-28 DIAGNOSIS — E1165 Type 2 diabetes mellitus with hyperglycemia: Secondary | ICD-10-CM

## 2022-06-28 DIAGNOSIS — Z1231 Encounter for screening mammogram for malignant neoplasm of breast: Secondary | ICD-10-CM

## 2022-06-28 DIAGNOSIS — E559 Vitamin D deficiency, unspecified: Secondary | ICD-10-CM

## 2022-06-28 DIAGNOSIS — E782 Mixed hyperlipidemia: Secondary | ICD-10-CM | POA: Diagnosis not present

## 2022-06-28 LAB — POCT GLYCOSYLATED HEMOGLOBIN (HGB A1C): Hemoglobin A1C: 7 % — AB (ref 4.0–5.6)

## 2022-06-28 NOTE — Progress Notes (Signed)
[facility] [address] [place], [place] [address]  Internal MEDICINE  Office Visit Note  Patient Name: Cassidy Parsons  [id number]  [id number]  Date of Service: 06/28/2022  Chief Complaint  Patient presents with   Diabetes   Gastroesophageal Reflux   Hypertension   Follow-up    HPI Cassidy Parsons presents for a follow-up visit for diabetes, hypertension, screenings and labs.  Diabetes -- slightly increased A1c to 7.0 but still fairly stable.  White coat syndrome without hypertension -- elevated at first, improved when rechecked, not on any medications . Does not need any refills today Due for routine screening mammogram.  Wants to complete her routine labs prior to her upcoming annual wellness visit.     Current Medication: Outpatient Encounter Medications as of 06/28/2022  Medication Sig   Accu-Chek Softclix Lancets lancets Use as instructed once a daily DX E11.65   allopurinol (ZYLOPRIM) 100 MG tablet TAKE (1) TABLET BY MOUTH EVERY DAY   aspirin EC 81 MG tablet Take 81 mg by mouth daily.   atorvastatin (LIPITOR) 10 MG tablet Take  1 tab po twice a week   azelastine (ASTELIN) 0.1 % nasal spray PLACE 1 SPRAY INTO EACH NOSTRIL DAILY ASDIRECTED.   Blood Glucose Monitoring Suppl (ACCU-CHEK GUIDE) w/Device KIT Use as directed Dx E11.65   Calcium Carb-Cholecalciferol 600-200 MG-UNIT TABS Take 1 tablet by mouth daily.   cetirizine (ZYRTEC) 10 MG tablet Take 10 mg by mouth daily.   clobetasol (TEMOVATE) 0.05 % external solution APPLY TOPICALLY TWICE A DAY   clotrimazole-betamethasone (LOTRISONE) cream Apply 1 Application topically daily.   esomeprazole (NEXIUM) 40 MG capsule Take 40 mg by mouth daily at 12 noon.   fluticasone (FLONASE) 50 MCG/ACT nasal spray PLACE 1 SPRAY INTO BOTH NOSTRILS DAILY   glipiZIDE (GLUCOTROL XL) 5 MG 24 hr tablet TAKE 1 TABLET BY MOUTH DAILY WITH BREAKFAST   glucose blood (ACCU-CHEK GUIDE) test strip Use as instructed once a daily  DX E11.65    ketoconazole (NIZORAL) 2 % shampoo APPLY 1 APPLICATION TOPICALLY TWICE A WEEK.   montelukast (SINGULAIR) 10 MG tablet TAKE 1 TABLET BY MOUTH DAILY AT BEDTIME   Multiple Vitamins-Minerals (MULTIVITAMIN ADULT PO) Take 1 tablet by mouth daily.   nystatin (MYCOSTATIN/NYSTOP) powder Apply 1 application. topically 3 (three) times daily as needed.   oxybutynin (DITROPAN) 5 MG tablet TAKE (1) TABLET BY MOUTH EVERY DAY   No facility-administered encounter medications on file as of 06/28/2022.    Surgical History: Past Surgical History:  Procedure Laterality Date   BRAIN SURGERY     BREAST BIOPSY     BREAST SURGERY     biopsy right breast   CRANIOTOMY N/A 02/10/2015   Procedure: Removal of cranial plate with scalp wound revision;  Surgeon: Julio Sicks, MD;  Location: [facility];  Service: Neurosurgery;  Laterality: N/A;   DIAGNOSTIC LAPAROSCOPY     panniculectomy   PARTIAL HYSTERECTOMY     TONSILLECTOMY      Medical History: Past Medical History:  Diagnosis Date   Asthma    Brain tumor (benign) (HCC)    right side   Diabetes mellitus without complication (HCC)    borderline   Early cataracts, bilateral    Environmental and seasonal allergies    GERD (gastroesophageal reflux disease)    Hypertension    PMH:   On home oxygen therapy    pt is no longer  using oxygen at night   Seizures (HCC)    haven't  had a seizure since brain surgery in 2001   Uterine cancer [facility])     Family History: Family History  Problem Relation Age of Onset   Cancer Mother    Diabetes Father    Heart failure Father    Depression Sister    Stroke Sister    Depression Sister    Breast cancer Maternal Aunt    Cancer Brother    Cancer Brother    Cancer Brother    Diabetes Other     Social History   Socioeconomic History   Marital status: Widowed    Spouse name: Not on file   Number of children: Not on file   Years of education: Not on file   Highest education level: Not on file  Occupational  History   Not on file  Tobacco Use   Smoking status: Former    Types: Cigarettes   Smokeless tobacco: Never   Tobacco comments:    quit smoking cigarettes 34 years ago  Vaping Use   Vaping Use: Never used  Substance and Sexual Activity   Alcohol use: No   Drug use: No   Sexual activity: Not on file  Other Topics Concern   Not on file  Social History Narrative   Not on file   Social Determinants of Health   Financial Resource Strain: Low Risk  (03/15/2021)   Overall Financial Resource Strain (CARDIA)    Difficulty of Paying Living Expenses: Not hard at all  Food Insecurity: No Food Insecurity (03/15/2021)   Hunger Vital Sign    Worried About Running Out of Food in the Last Year: Never true    Ran Out of Food in the Last Year: Never true  Transportation Needs: No Transportation Needs (03/15/2021)   PRAPARE - Administrator, Civil Service (Medical): No    Lack of Transportation (Non-Medical): No  Physical Activity: Not on file  Stress: Not on file  Social Connections: Not on file  Intimate Partner Violence: Not on file      Review of Systems  Constitutional:  Negative for chills, fatigue and unexpected weight change.  HENT:  Negative for congestion, rhinorrhea, sneezing and sore throat.   Eyes:  Negative for redness.  Respiratory:  Negative for cough, chest tightness and shortness of breath.   Cardiovascular:  Negative for chest pain and palpitations.  Gastrointestinal:  Negative for abdominal pain, constipation, diarrhea, nausea and vomiting.  Genitourinary:  Negative for dysuria and frequency.  Musculoskeletal:  Negative for arthralgias, back pain, joint swelling and neck pain.  Skin:  Negative for rash.  Neurological: Negative.  Negative for tremors and numbness.  Hematological:  Negative for adenopathy. Does not bruise/bleed easily.  Psychiatric/Behavioral:  Negative for behavioral problems (Depression), sleep disturbance and suicidal ideas. The patient is  not nervous/anxious.     Vital Signs: BP 138/85 Comment: 164/91  Pulse 69   Temp 98.4 F (36.9 C)   Resp 16   Ht 5\' 1"  (1.549 m)   Wt 244 lb 9.6 oz (110.9 kg)   SpO2 99%   BMI 46.22 kg/m    Physical Exam Vitals reviewed.  Constitutional:      General: She is not in acute distress.    Appearance: Normal appearance. She is obese. She is not ill-appearing.  HENT:     Head: Normocephalic and atraumatic.  Eyes:     Pupils: Pupils are equal, round, and reactive to light.  Cardiovascular:     Rate and Rhythm: Normal  rate and regular rhythm.  Pulmonary:     Effort: Pulmonary effort is normal. No respiratory distress.  Neurological:     Mental Status: She is alert and oriented to person, place, and time.  Psychiatric:        Mood and Affect: Mood normal.        Behavior: Behavior normal.        Assessment/Plan: 1. Type 2 diabetes mellitus with hyperglycemia, without long-term current use of insulin (HCC) A1c slightly elevated at 7.0, will repeat A1c in November at her annual wellness exam, routine labs ordered  - POCT glycosylated hemoglobin (Hb A1C) - CBC with Differential/Platelet - CMP14+EGFR - Lipid Profile - Vitamin D (25 hydroxy)  2. White coat syndrome without diagnosis of hypertension BP returned to normal when rechecked   3. Mixed hyperlipidemia Routine labs ordered  - CBC with Differential/Platelet - CMP14+EGFR - Lipid Profile - Vitamin D (25 hydroxy)  4. Vitamin D deficiency Routine lab ordered  - Vitamin D (25 hydroxy)  5. Encounter for screening mammogram for malignant neoplasm of breast Screening mammogram ordered  - MM 3D SCREENING MAMMOGRAM BILATERAL BREAST; Future   General Counseling: Cassidy Parsons verbalizes understanding of the findings of todays visit and agrees with plan of treatment. I have discussed any further diagnostic evaluation that may be needed or ordered today. We also reviewed her medications today. she has been encouraged to call  the office with any questions or concerns that should arise related to todays visit.    Orders Placed This Encounter  Procedures   MM 3D SCREENING MAMMOGRAM BILATERAL BREAST   CBC with Differential/Platelet   CMP14+EGFR   Lipid Profile   Vitamin D (25 hydroxy)   POCT glycosylated hemoglobin (Hb A1C)    No orders of the defined types were placed in this encounter.   Return for previously scheduled, AWV, Khalilah Hoke PCP in november .   Total time spent:30 Minutes Time spent includes review of chart, medications, test results, and follow up plan with the patient.   [place] Controlled Substance Database was reviewed by me.  This patient was seen by Sallyanne Kuster, FNP-C in collaboration with Dr. Beverely Risen as a part of collaborative care agreement.   Ananda Sitzer R. Tedd Sias, MSN, FNP-C Internal medicine

## 2022-06-29 ENCOUNTER — Encounter: Payer: Self-pay | Admitting: Nurse Practitioner

## 2022-07-10 IMAGING — CT NM PET TUM IMG RESTAG (PS) SKULL BASE T - THIGH
1 of 9 series · 1 of 25 positions shown · non-contrast
Comparison: Multiple priors, most recent CT abdomen and pelvis
dated December 26, 2020; chest CT dated July 18, 2020

CLINICAL DATA: Restaging for clear cell adenocarcinoma of
endometrium.

EXAM:
NUCLEAR MEDICINE PET SKULL BASE TO THIGH
TECHNIQUE: 12.5 mCi F-18 FDG was injected intravenously. Full-ring PET imaging
was performed from the skull base to thigh after the radiotracer. CT
data was obtained and used for attenuation correction and anatomic
localization.
Fasting blood glucose: 141 mg/dl

[Series 3: ct wb 5.0 b30f · axial · 5.0mm · 0.98mm/px · 1 of 290 slices shown]
[im 290/290  brain]
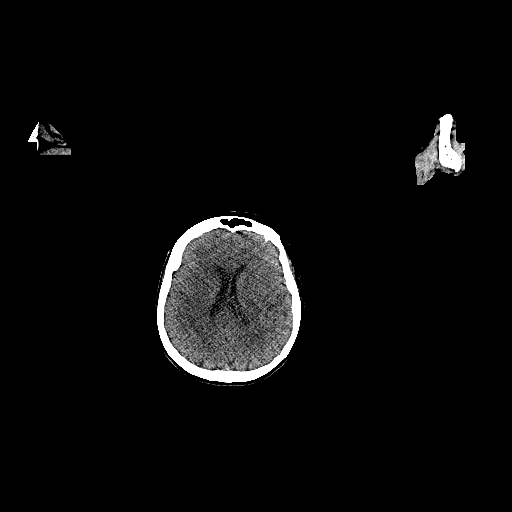

[1 of 25 positions shown; findings below may reference images not displayed]

FINDINGS: Mediastinal blood pool activity: SUV max

Liver activity: SUV max

NECK: No hypermetabolic lymph nodes in the neck.

Incidental CT findings: none

CHEST: No hypermetabolic mediastinal or hilar nodes. No suspicious
pulmonary nodules on the CT scan.

Incidental CT findings: Three-vessel coronary artery calcifications.
Mild atherosclerotic disease of the thoracic aorta. Small solid
pulmonary nodule of the left upper lobe measuring 5 mm on image 88,
unchanged when compared with prior exam dating back to August 07, 2019

ABDOMEN/PELVIS: New high density soft tissue stranding of the right
retroperitoneum with mild FDG uptake, likely postprocedural
retroperitoneal hematoma. Previously described enlarged right
retroperitoneal lymph node is obscured and FDG avidity or size
cannot be characterized.

Incidental CT findings: Cholelithiasis with no gallbladder wall
thickening biliary ductal dilation. Uterus is surgically absent. New
high density soft tissue stranding of the right retroperitoneum,
likely postprocedural retroperitoneal hematoma.

SKELETON: No focal hypermetabolic activity to suggest skeletal
metastasis.

Incidental CT findings: Sclerosis of the sacrum with associated
linear lucencies and mild associated FDG uptake.
IMPRESSION: 1. Soft tissue stranding of the right retroperitoneum with mild FDG
uptake, likely postprocedural changes. Previously described enlarged
right retroperitoneal lymph node is obscured and FDG avidity cannot
be characterized.
2. No additional sites of hypermetabolic activity to suggest
metastatic disease.
3. Sclerosis of the sacrum with associated linear lucencies and mild
FDG uptake, findings are similar to prior exams and likely due to
chronic sacral insufficiency fractures.
4.  Aortic Atherosclerosis ([id number]).

## 2022-07-11 ENCOUNTER — Ambulatory Visit
Admission: RE | Admit: 2022-07-11 | Discharge: 2022-07-11 | Disposition: A | Discharge status: Home / Self Care | Payer: Medicare HMO | Source: Ambulatory Visit | Attending: Nurse Practitioner | Admitting: Nurse Practitioner

## 2022-07-11 DIAGNOSIS — Z1231 Encounter for screening mammogram for malignant neoplasm of breast: Secondary | ICD-10-CM | POA: Insufficient documentation

## 2022-07-18 ENCOUNTER — Other Ambulatory Visit: Payer: Self-pay | Admitting: Nurse Practitioner

## 2022-07-18 DIAGNOSIS — E782 Mixed hyperlipidemia: Secondary | ICD-10-CM

## 2022-07-18 DIAGNOSIS — E1165 Type 2 diabetes mellitus with hyperglycemia: Secondary | ICD-10-CM

## 2022-07-25 ENCOUNTER — Encounter: Payer: Self-pay | Admitting: Obstetrics and Gynecology

## 2022-07-25 ENCOUNTER — Inpatient Hospital Stay: Payer: Medicare HMO | Attending: Obstetrics and Gynecology | Admitting: Obstetrics and Gynecology

## 2022-07-25 ENCOUNTER — Other Ambulatory Visit: Payer: Self-pay

## 2022-07-25 VITALS — BP 162/89 | HR 83 | Temp 98.6°F | Resp 20 | Wt 244.8 lb

## 2022-07-25 DIAGNOSIS — Z8542 Personal history of malignant neoplasm of other parts of uterus: Secondary | ICD-10-CM | POA: Diagnosis not present

## 2022-07-25 DIAGNOSIS — E782 Mixed hyperlipidemia: Secondary | ICD-10-CM | POA: Diagnosis not present

## 2022-07-25 DIAGNOSIS — E1165 Type 2 diabetes mellitus with hyperglycemia: Secondary | ICD-10-CM | POA: Diagnosis not present

## 2022-07-25 DIAGNOSIS — Z90721 Acquired absence of ovaries, unilateral: Secondary | ICD-10-CM | POA: Insufficient documentation

## 2022-07-25 DIAGNOSIS — Z90710 Acquired absence of both cervix and uterus: Secondary | ICD-10-CM | POA: Insufficient documentation

## 2022-07-25 DIAGNOSIS — E559 Vitamin D deficiency, unspecified: Secondary | ICD-10-CM | POA: Diagnosis not present

## 2022-07-25 DIAGNOSIS — B372 Candidiasis of skin and nail: Secondary | ICD-10-CM | POA: Diagnosis not present

## 2022-07-25 DIAGNOSIS — C541 Malignant neoplasm of endometrium: Secondary | ICD-10-CM

## 2022-07-25 MED ORDER — NYSTATIN 100000 UNIT/GM EX POWD: 1.0000 | Freq: Three times a day (TID) | CUTANEOUS | 1 refills | Status: DC | PRN

## 2022-07-25 NOTE — Progress Notes (Signed)
Gynecologic Oncology Interval Visit   Referring Provider: Dr. Tiburcio Pea  Chief Complaint: 1A mixed clear cell/endometrioid endometrial carcinoma the endometrium status post TLH/BSO, radiation  Subjective:  Cassidy Parsons is a 77 y.o. 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 Initial pathology from endometrial biopsy was reviewed at [facility] and confirmed clear cell carcinoma. She returns to clinic for continued surveillance.    She was last seen October 2023.     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  Patient had ~ 5 year history of PMB, worse since May 2021 after she fell on her stomach. On Aug 08, 2022 underwent endometrial biopsy. - Clear cell carcinoma, FIGO grade 3  Pap Aug 08, 2019- NILM  Husband died in 04-02-22 and she delayed self care because she was taking care of him.   CT scan C/A/P 08/07/19 did not show metastatic disease. Impression:  1. Heterogeneous and thickened appearance of the endometrium in keeping with patients known primary malignancy.  2. No definite evidence of metastatic disease in the chest, abdomen, or pelvis.  3. Bilateral pulmonary nodules, nonspecific. Recommend continued attention on follow-up.  4. Mild thickening of the sigmoid colon and tethering of the superior bladder wall, favored sequela of prior diverticulitis.   Underwent TLH, BSx/RO SLN mapping at [facility] 08/11/19.  Grade 2 endometrioid 4.2 cm cancer with 26% invasion.  Negative right adnexa and cervix and no LVSI.  Left adnexa could not be removed due to dense adhesions of sigmoid colon secondary to severe diverticular disease.   Comment: The tumor demonstrates foci of well-formed endometrioid glands with defined nuclear  polarity, but a considerable portion of the tumor appears solid, with large areas of squamous differentiation.  Morphologic features of clear cell carcinoma, such as marked nuclear atypia, hobnail architecture, hyalinized stroma, and intracytoplasmic eosinophilic globules, are not identified.  Secretory change is present in some cells, which stain positive for the markers Napsin A and AMACR.  The reported history of a prior biopsy with clear cell carcinoma is noted.  Review of this biopsy may be beneficial to exclude the possibility of a mixed clear cell/endometrioid tumor, if this diagnosis would influence patient management.  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   Slides were reviewed at [facility]. Dr. Johnnette Litter recommended adjuvant WPRT.   10/20/19-11/23/19 completed WPRT. Suffered diarrhea and fatigue during treatment and she has a h/o urinary frequency unrelieved by oxybutynin.   History of pulmonary nodules:  Previous imaging showed bilateral pulmonary nodules, 2-3 mm nonspecific on CT scan. Per patient, results were reassuring. [facility] radiology regarding read on 07/18/20 CT scan. No further follow up needed given stability.   09/19/2020 CT ABDOMEN AND PELVIS WITH CONTRAST  Soft tissue nodule at the right aspect of the aorta, anterior to the right ureter, measuring up to 10 x 8 x 24 mm (series 2, image 49 and series 6, image 118), which does not fill with contrast on the delayed sequence.   12/26/20 CT ABDOMEN AND PELVIS WITH CONTRAST Enlarged visualized portions of great saphenous veins with multiple associated distended anterior pelvic wall veins. 2.6 x 1.2 cm enlarged right retroperitoneal nodule or lymph node anterior to the mid ureter (5:59 and 2:51), previously 2.4 x 1. No new adenopathy.   01/12/21  PET  1. Soft tissue stranding of the right retroperitoneum with mild FDG uptake, likely postprocedural changes. Previously described enlarged right  retroperitoneal lymph node is obscured and FDG avidity cannot be characterized. 2. No additional sites of hypermetabolic activity to suggest metastatic disease. 3. Sclerosis of the sacrum with associated linear lucencies and mild FDG uptake, findings are similar to prior exams and likely due to chronic sacral insufficiency fractures. 4.  Aortic Atherosclerosis (ICD10-I70.0).  06/02/21 Follow up CT was negative.  1. No evidence of metastatic disease in the abdomen or pelvis. 2. Cholelithiasis.  11/2021 She saw Dr. Rushie Chestnut, had a negative exam, and was released from Rad Onc.     She had mammogram 7/24 which had no mammographic evidence of malignancy. RECOMMENDATION: Screening mammogram in one year. (Code:[id number]) BI-RADS CATEGORY  1: Negative.  She previously declined additional colonoscopy.     Problem List: Patient Active Problem List   Diagnosis Date Noted   Primary clear cell adenocarcinoma of endometrium (HCC) 12/23/2019   Morbid obesity with BMI of 45.0-49.9, adult (HCC) 08/12/2019   Urinary tract infection without hematuria 08/07/2018   Dysuria 08/07/2018   Osteoarthritis of knee 04/11/2017   Low back pain 02/05/2017   Mild persistent asthma, uncomplicated 02/05/2017   Rhinitis, allergic 02/05/2017   GAD (generalized anxiety disorder) 02/05/2017   Unspecified complication of internal orthopedic prosthetic device, implant and graft, initial encounter (HCC) 02/05/2017   Diabetes mellitus without complication (HCC) 02/05/2017   Gout, unspecified 02/05/2017   Occlusion and stenosis of bilateral carotid arteries 02/05/2017   Insomnia, unspecified 02/05/2017   Sleep apnea 02/05/2017   Elevated blood-pressure reading without diagnosis of hypertension 02/05/2017   Dupuytren's disease of palm 10/05/2016   Exposed orthopaedic hardware (HCC) 02/10/2015    Past Medical History: Past Medical History:  Diagnosis Date   Asthma    Brain tumor (benign) (HCC)    right side    Diabetes mellitus without complication (HCC)    borderline   Early cataracts, bilateral    Environmental and seasonal allergies    GERD (gastroesophageal reflux disease)    Hypertension    PMH:   On home oxygen therapy    pt is no longer  using oxygen at night   Seizures (HCC)    haven't had a seizure since brain surgery in 2001   Uterine cancer (HCC)     Past Surgical History: Past Surgical History:  Procedure Laterality Date   BRAIN SURGERY     BREAST BIOPSY     BREAST SURGERY     biopsy right breast   CRANIOTOMY N/A 02/10/2015   Procedure: Removal of cranial plate with scalp wound revision;  Surgeon: Julio Sicks, MD;  Location: [facility];  Service: Neurosurgery;  Laterality: N/A;   DIAGNOSTIC LAPAROSCOPY     panniculectomy   PARTIAL HYSTERECTOMY     TONSILLECTOMY      Past Gynecologic History:  Menarche: age [age] Post menopausal Hx of post menopausal bleeding    OB History:  OB History  Gravida Para Term Preterm AB Living  2 1 1   1 1   SAB IAB Ectopic Multiple Live Births               # Outcome Date GA Lbr Len/2nd Weight Sex Type Anes PTL Lv  2 AB           1 Term            Family History: Family History  Problem Relation Age of Onset  Cancer Mother    Diabetes Father    Heart failure Father    Depression Sister    Stroke Sister    Depression Sister    Breast cancer Maternal Aunt    Cancer Brother    Cancer Brother    Cancer Brother    Diabetes Other    Social History: Social History   Socioeconomic History   Marital status: Widowed    Spouse name: Not on file   Number of children: Not on file   Years of education: Not on file   Highest education level: Not on file  Occupational History   Not on file  Tobacco Use   Smoking status: Former    Types: Cigarettes   Smokeless tobacco: Never   Tobacco comments:    quit smoking cigarettes 34 years ago  Vaping Use   Vaping status: Never Used  Substance and Sexual Activity   Alcohol use:  No   Drug use: No   Sexual activity: Not on file  Other Topics Concern   Not on file  Social History Narrative   Not on file   Social Determinants of Health   Financial Resource Strain: Low Risk  (03/15/2021)   Overall Financial Resource Strain (CARDIA)    Difficulty of Paying Living Expenses: Not hard at all  Food Insecurity: No Food Insecurity (03/15/2021)   Hunger Vital Sign    Worried About Running Out of Food in the Last Year: Never true    Ran Out of Food in the Last Year: Never true  Transportation Needs: No Transportation Needs (03/15/2021)   PRAPARE - Administrator, Civil Service (Medical): No    Lack of Transportation (Non-Medical): No  Physical Activity: Not on file  Stress: Not on file  Social Connections: Unknown (05/21/2021)   [facility] from [facility]   Social Network    Social Network: Not on file  Intimate Partner Violence: Unknown (04/12/2021)   Received from [facility]   HITS    Physically Hurt: Not on file    Insult or Talk Down To: Not on file    Threaten Physical Harm: Not on file    Scream or Curse: Not on file   Immunization History  Administered Date(s) Administered   Influenza-Unspecified 10/22/2016, 09/21/2019, 10/10/2020, 10/19/2021   Moderna SARS-COV2 Booster Vaccination 12/15/2019, 11/04/2020, 10/25/2021   Moderna Sars-Covid-2 Vaccination 03/02/2019, 03/31/2019, 06/28/2020   Pneumococcal Conjugate-13 07/23/2015   Pneumococcal Polysaccharide-23 02/18/2018   Zoster, Live 10/16/2012   Zoster, Unspecified 10/16/2012   Allergies: Allergies  Allergen Reactions   Dust Mite Extract    Mold Extract  [Trichophyton]    Molds & Smuts Other (See Comments)   Pollen Extract    Roxanol [Morphine] Other (See Comments)    Extreme pain   Meperidine Rash    Itching   Meperidine And Related Itching and Rash   Other Itching, Rash and Other (See Comments)    All pain relievers except hydrocodone Trees, mold, dust, and perfume allergies  (sneezing, eyes swell up)   Propoxyphene Itching and Rash   Tape Itching and Rash    With paper tape.  Please use adhesive tape Rash   Tramadol Itching and Rash    Current Medications: Current Outpatient Medications  Medication Sig Dispense Refill   Accu-Chek Softclix Lancets lancets Use as instructed once a daily DX E11.65 100 each 0   allopurinol (ZYLOPRIM) 100 MG tablet TAKE (1) TABLET BY MOUTH EVERY DAY 90 tablet 1  aspirin EC 81 MG tablet Take 81 mg by mouth daily.     atorvastatin (LIPITOR) 10 MG tablet 1 TABLET BY MOUTH TWICE A WEEK 90 tablet 1   azelastine (ASTELIN) 0.1 % nasal spray PLACE 1 SPRAY INTO EACH NOSTRIL DAILY ASDIRECTED. 30 mL 3   Blood Glucose Monitoring Suppl (ACCU-CHEK GUIDE) w/Device KIT Use as directed Dx E11.65 1 kit 0   Calcium Carb-Cholecalciferol 600-200 MG-UNIT TABS Take 1 tablet by mouth daily.     cetirizine (ZYRTEC) 10 MG tablet Take 10 mg by mouth daily.     clobetasol (TEMOVATE) 0.05 % external solution APPLY TOPICALLY TWICE A DAY 50 mL 3   clotrimazole-betamethasone (LOTRISONE) cream Apply 1 Application topically daily. 45 g 5   esomeprazole (NEXIUM) 40 MG capsule Take 40 mg by mouth daily at 12 noon.     fluticasone (FLONASE) 50 MCG/ACT nasal spray PLACE 1 SPRAY INTO BOTH NOSTRILS DAILY 16 g 3   glipiZIDE (GLUCOTROL XL) 5 MG 24 hr tablet TAKE 1 TABLET BY MOUTH DAILY WITH BREAKFAST 90 tablet 1   glucose blood (ACCU-CHEK GUIDE) test strip Use as instructed once a daily  DX E11.65 100 each 1   ketoconazole (NIZORAL) 2 % shampoo APPLY 1 APPLICATION TOPICALLY TWICE A WEEK. 120 mL 3   montelukast (SINGULAIR) 10 MG tablet TAKE 1 TABLET BY MOUTH DAILY AT BEDTIME 90 tablet 1   Multiple Vitamins-Minerals (MULTIVITAMIN ADULT PO) Take 1 tablet by mouth daily.     oxybutynin (DITROPAN) 5 MG tablet TAKE (1) TABLET BY MOUTH EVERY DAY 90 tablet 0   nystatin (MYCOSTATIN/NYSTOP) powder Apply 1 Application topically 3 (three) times daily as needed. 60 g 1   No  current facility-administered medications for this visit.    Review of Systems General:  no complaints Skin: She requested more nystatin powder for her skin candidiasis.  She also noted a new lesion on the right abdominal wall Eyes: no complaints HEENT: no complaints Breasts: no complaints Pulmonary: no complaints Cardiac: no complaints Gastrointestinal: no complaints Genitourinary/Sexual: no complaints Ob/Gyn: no complaints Musculoskeletal: Chronic back pain Hematology: no complaints Neurologic/Psych: no complaints    Objective:  Physical Examination:  BP (!) 162/89   Pulse 83   Temp 98.6 F (37 C)   Resp 20   Wt 244 lb 12.8 oz (111 kg)   SpO2 97%   BMI 46.25 kg/m    ECOG Performance Status: 1 - Symptomatic but completely ambulatory  GENERAL: Patient is a elderly appearing female in no acute distress HEENT:  Atraumatic and normocephalic. PERRL NODES:  No cervical, supraclavicular, axillary, or inguinal lymphadenopathy palpated.  LUNGS: Normal respiratory effort ABDOMEN:  Soft, nontender. Nondistended. No masses/ascites/hernia/or hepatomegaly.  EXTREMITIES:  No peripheral edema.   SKIN: Thiemann areas below the pannus consistent with candidiasis.  There is a small raised erythematous lesion on the right abdomen that is approximately 5 mm and then surrounded by erythema.  The entire lesions approximately 1 cm NEURO:  Nonfocal. Well oriented.  Appropriate affect.  Pelvic: EGBUS: no lesions Cervix: surgically absent Vagina: no lesions, no discharge or bleeding.  On palpation thickness of the vaginal cuff but no definite mass. Uterus: surgically absent BME: no palpable masses Rectovaginal: deferred  Radiology 06/02/2021 CT ABDOMEN AND PELVIS WITH CONTRAST Narrative & Impression  CLINICAL DATA:  Uterine/cervical cancer; * Tracking Code: BO *   TECHNIQUE: Multidetector CT imaging of the abdomen and pelvis was performed using the standard protocol following bolus  administration of intravenous contrast.  RADIATION DOSE REDUCTION: This exam was performed according to the departmental dose-optimization program which includes automated exposure control, adjustment of the mA and/or kV according to patient size and/or use of iterative reconstruction technique.   CONTRAST:  OMNIPAQUE IOHEXOL 300 MG/ML  SOLN   COMPARISON:  CT abdomen and pelvis dated December 26, 2020; PET-CT dated January 12, 2021   FINDINGS: Lower chest: No acute abnormality.   Hepatobiliary: Suspicious liver lesions. Cholelithiasis with no gallbladder wall thickening. No biliary ductal dilation.   Pancreas: Unremarkable. No pancreatic ductal dilatation or surrounding inflammatory changes.   Spleen: Normal in size without focal abnormality.   Adrenals/Urinary Tract: Adrenal glands are unremarkable. Kidneys are normal, without renal calculi, focal lesion, or hydronephrosis. Bladder is unremarkable.   Stomach/Bowel: Stomach is within normal limits. Appendix appears normal. Diverticulosis. Mild wall thickening of the sigmoid colon, unchanged when compared with prior exam and likely sequela of prior acute diverticulitis. No inflammatory change or evidence of obstruction.   Vascular/Lymphatic: Mild aortic atherosclerosis. No enlarged abdominal or pelvic lymph nodes. Previously seen enlarged right retroperitoneal lymph node is not present on today's exam.   Reproductive: Status post hysterectomy. No adnexal masses.   Other: No abdominal wall hernia or abnormality. No abdominopelvic ascites.   Musculoskeletal: No acute or significant osseous findings.   IMPRESSION: 1. No evidence of metastatic disease in the abdomen or pelvis. 2. Cholelithiasis.      Assessment:  Cassidy Parsons is a 77 y.o. female diagnosed with 1A mixed clear cell/endometrioid endometrial carcinoma (MSI; dMMR with loss of MLH1/PMS2 with methylated MLH1 alleles present) s/p TLH, RSO 08/11/19. SLN  mapping unsuccessful and left adnexa could not be removed due to severe adhesions from diverticular disease.  No LVSI, or cervical/adnexal involvement and invades 23% of the myometrium. Pre-op CT scan negative for metastatic disease. Treated with whole pelvic radiation 11/21.  Possible vaginal cuff mass, CT 09/2020 showed 2.4 nodule thought to represent lymph node, near IVC, above field of RT. CT 12/2020 increased to 2.6 cm. Biopsy was negative though no lymphatic tissue present. PET scan showed stranding of right retroperitoneum, mildly FDG active. Retroperitoneal LN was obscured and could not be characterized. Follow up CT 06/02/21 was negative. She is asymptomatic and clinically NED. Exam stable1.5-2 cm vaginal cuff mass possible postop change.  Follow-up imaging with CT scan on 06/02/2021 was negative.  Pelvic exams are very difficult for her due to severe back pain with positioning. Recommend continued surveillance.   Pulmonary nodules, 07/2019 on CT chest, on follow up [facility] at [facility] 2022 report stable. No additional imaging was recommended.   Intertrigous dermatitis, stable  Skin lesion  Medical co-morbidities complicating care: HTN, morbid obesity (Body mass index is 46.25 kg/m.), prediabetes, possible h/o carotid stenosis.  Plan:   Problem List Items Addressed This Visit       Genitourinary   Primary clear cell adenocarcinoma of endometrium (HCC) - Primary   Other Visit Diagnoses     Candidal intertrigo           Given resolution of possible retroperitoneal lymph node and findings of persistent vaginal cuff mass, recommend continued surveillance and no additional imaging at this time unless concerning findings on exam or symptoms. She is concerned about cost of imaging studies. Patient agreeable. Vaginal findings are very reassuring on today's exam.  No definitive mass palpated.  Recommend follow up/surveillance with pelvic exam in 6 months due to difficulty with exams. If  recurrent disease, she would be a candidate for  chemotherapy or pembrolizumab/immunotherapy.   Intertrigous dermatitis and skin lesion- follow up with her PCP.  She was given a refill on her prescription for nystatin powder today.  The skin lesion recommended warm compresses until resolution or until she has a chance to follow-up with her PCP  I personally had a face to face interaction and evaluated the patient.  I have reviewed her history and available records and have performed the entire physical exam. The assessment and plan which was fully formulated by me.    Gomer France Leta Jungling, MD

## 2022-07-26 LAB — LIPID PANEL
Chol/HDL Ratio: 3 ratio (ref 0.0–4.4)
Cholesterol, Total: 152 mg/dL (ref 100–199)
HDL: 51 mg/dL (ref >39)
LDL Chol Calc (NIH): 80 mg/dL (ref 0–99)
Triglycerides: 115 mg/dL (ref 0–149)
VLDL Cholesterol Cal: 21 mg/dL (ref 5–40)

## 2022-07-26 LAB — CBC WITH DIFFERENTIAL/PLATELET
Basophils Absolute: 0 10*3/uL (ref 0.0–0.2)
Basos: 1 %
EOS (ABSOLUTE): 0.2 10*3/uL (ref 0.0–0.4)
Eos: 4 %
Hematocrit: 42.6 % (ref 34.0–46.6)
Hemoglobin: 14.3 g/dL (ref 11.1–15.9)
Immature Grans (Abs): 0 10*3/uL (ref 0.0–0.1)
Immature Granulocytes: 0 %
Lymphocytes Absolute: 0.9 10*3/uL (ref 0.7–3.1)
Lymphs: 21 %
MCH: 29.4 pg (ref 26.6–33.0)
MCHC: 33.6 g/dL (ref 31.5–35.7)
MCV: 88 fL (ref 79–97)
Monocytes Absolute: 0.5 10*3/uL (ref 0.1–0.9)
Monocytes: 11 %
Neutrophils Absolute: 2.9 10*3/uL (ref 1.4–7.0)
Neutrophils: 63 %
Platelets: 256 10*3/uL (ref 150–450)
RBC: 4.86 x10E6/uL (ref 3.77–5.28)
RDW: 13.4 % (ref 11.7–15.4)
WBC: 4.5 10*3/uL (ref 3.4–10.8)

## 2022-07-26 LAB — CMP14+EGFR
ALT: 14 IU/L (ref 0–32)
AST: 21 IU/L (ref 0–40)
Albumin: 3.8 g/dL (ref 3.8–4.8)
Alkaline Phosphatase: 116 IU/L (ref 44–121)
BUN/Creatinine Ratio: 23 (ref 12–28)
BUN: 19 mg/dL (ref 8–27)
Bilirubin Total: 0.5 mg/dL (ref 0.0–1.2)
CO2: 23 mmol/L (ref 20–29)
Calcium: 9.3 mg/dL (ref 8.7–10.3)
Chloride: 101 mmol/L (ref 96–106)
Creatinine, Ser: 0.83 mg/dL (ref 0.57–1.00)
Globulin, Total: 2.9 g/dL (ref 1.5–4.5)
Glucose: 134 mg/dL — ABNORMAL HIGH (ref 70–99)
Potassium: 4.6 mmol/L (ref 3.5–5.2)
Sodium: 140 mmol/L (ref 134–144)
Total Protein: 6.7 g/dL (ref 6.0–8.5)
eGFR: 73 mL/min/{1.73_m2} (ref >59)

## 2022-07-26 LAB — VITAMIN D 25 HYDROXY (VIT D DEFICIENCY, FRACTURES): Vit D, 25-Hydroxy: 35 ng/mL (ref 30.0–100.0)

## 2022-07-28 NOTE — Progress Notes (Signed)
CBC, cholesterol, vitamin D, and CMP are normal except for glucose of 134. Good job.

## 2022-07-30 ENCOUNTER — Telehealth: Payer: Self-pay

## 2022-07-30 NOTE — Telephone Encounter (Signed)
-----   Message from [facility] sent at 07/28/2022 11:29 AM EDT ----- CBC, cholesterol, vitamin D, and CMP are normal except for glucose of 134. Good job.

## 2022-07-30 NOTE — Telephone Encounter (Signed)
Pt.notified

## 2022-09-05 ENCOUNTER — Other Ambulatory Visit: Payer: Self-pay | Admitting: Nurse Practitioner

## 2022-09-13 ENCOUNTER — Other Ambulatory Visit: Payer: Self-pay

## 2022-09-13 ENCOUNTER — Telehealth: Payer: Self-pay | Admitting: Nurse Practitioner

## 2022-09-13 ENCOUNTER — Other Ambulatory Visit: Payer: Self-pay | Admitting: Nurse Practitioner

## 2022-09-13 DIAGNOSIS — E1165 Type 2 diabetes mellitus with hyperglycemia: Secondary | ICD-10-CM

## 2022-09-13 MED ORDER — MONTELUKAST SODIUM 10 MG PO TABS: ORAL_TABLET | ORAL | 1 refills | Status: DC

## 2022-09-13 MED ORDER — KETOCONAZOLE 2 % EX SHAM: MEDICATED_SHAMPOO | CUTANEOUS | 3 refills | Status: DC

## 2022-09-13 NOTE — Telephone Encounter (Signed)
Ok add phar

## 2022-09-19 ENCOUNTER — Other Ambulatory Visit: Payer: Self-pay

## 2022-09-19 MED ORDER — OXYBUTYNIN CHLORIDE 5 MG PO TABS: 5.0000 mg | ORAL_TABLET | Freq: Every day | ORAL | 0 refills | Status: DC

## 2022-11-27 ENCOUNTER — Encounter: Payer: Self-pay | Admitting: Internal Medicine

## 2022-11-27 ENCOUNTER — Other Ambulatory Visit: Payer: Self-pay | Admitting: Nurse Practitioner

## 2022-11-27 ENCOUNTER — Ambulatory Visit (INDEPENDENT_AMBULATORY_CARE_PROVIDER_SITE_OTHER): Payer: Medicare HMO | Admitting: Internal Medicine

## 2022-11-27 VITALS — BP 120/75 | HR 74 | Temp 98.4°F | Resp 16 | Ht 61.0 in | Wt 247.2 lb

## 2022-11-27 DIAGNOSIS — Z0001 Encounter for general adult medical examination with abnormal findings: Secondary | ICD-10-CM

## 2022-11-27 DIAGNOSIS — S0005XS Superficial foreign body of scalp, sequela: Secondary | ICD-10-CM

## 2022-11-27 DIAGNOSIS — E1165 Type 2 diabetes mellitus with hyperglycemia: Secondary | ICD-10-CM

## 2022-11-27 DIAGNOSIS — E2839 Other primary ovarian failure: Secondary | ICD-10-CM | POA: Diagnosis not present

## 2022-11-27 DIAGNOSIS — E782 Mixed hyperlipidemia: Secondary | ICD-10-CM | POA: Diagnosis not present

## 2022-11-27 DIAGNOSIS — Z1211 Encounter for screening for malignant neoplasm of colon: Secondary | ICD-10-CM

## 2022-11-27 LAB — POCT GLYCOSYLATED HEMOGLOBIN (HGB A1C): Hemoglobin A1C: 7.1 % — AB (ref 4.0–5.6)

## 2022-11-27 NOTE — Progress Notes (Signed)
[facility] [address] [place], [place] [address]  Internal MEDICINE  Office Visit Note  Patient Name: Cassidy Parsons  [id number]  [id number]  Date of Service: 11/27/2022  Chief Complaint  Patient presents with   Medicare Wellness   Diabetes   Gastroesophageal Reflux   Hypertension     HPI Pt is here for routine health maintenance examination Feels well, has been going to the gym Liked [employer] but very expensive  Needs Cologuard / or colonoscopy  Current Medication: Outpatient Encounter Medications as of 11/27/2022  Medication Sig   Accu-Chek Softclix Lancets lancets Use as instructed once a daily DX E11.65   aspirin EC 81 MG tablet Take 81 mg by mouth daily.   atorvastatin (LIPITOR) 10 MG tablet 1 TABLET BY MOUTH TWICE A WEEK   azelastine (ASTELIN) 0.1 % nasal spray PLACE 1 SPRAY INTO EACH NOSTRIL DAILY ASDIRECTED.   Blood Glucose Monitoring Suppl (ACCU-CHEK GUIDE) w/Device KIT Use as directed Dx E11.65   Calcium Carb-Cholecalciferol 600-200 MG-UNIT TABS Take 1 tablet by mouth daily.   cetirizine (ZYRTEC) 10 MG tablet Take 10 mg by mouth daily.   clobetasol (TEMOVATE) 0.05 % external solution APPLY TOPICALLY TWICE A DAY   clotrimazole-betamethasone (LOTRISONE) cream Apply 1 Application topically daily.   esomeprazole (NEXIUM) 40 MG capsule Take 40 mg by mouth daily at 12 noon.   fluticasone (FLONASE) 50 MCG/ACT nasal spray PLACE 1 SPRAY INTO BOTH NOSTRILS DAILY   glipiZIDE (GLUCOTROL XL) 5 MG 24 hr tablet TAKE ONE TABLET BY MOUTH EVERY DAY WITH BREAKFAST   glucose blood (ACCU-CHEK GUIDE) test strip Use as instructed once a daily  DX E11.65   ketoconazole (NIZORAL) 2 % shampoo APPLY 1 APPLICATION TOPICALLY TWICE A WEEK   montelukast (SINGULAIR) 10 MG tablet TAKE 1 TABLET BY MOUTH DAILY AT BEDTIME   Multiple Vitamins-Minerals (MULTIVITAMIN ADULT PO) Take 1 tablet by mouth daily.   nystatin (MYCOSTATIN/NYSTOP) powder Apply 1 Application topically 3  (three) times daily as needed.   oxybutynin (DITROPAN) 5 MG tablet Take 1 tablet (5 mg total) by mouth daily.   [DISCONTINUED] allopurinol (ZYLOPRIM) 100 MG tablet TAKE (1) TABLET BY MOUTH EVERY DAY   No facility-administered encounter medications on file as of 11/27/2022.    Surgical History: Past Surgical History:  Procedure Laterality Date   BRAIN SURGERY     BREAST BIOPSY     BREAST SURGERY     biopsy right breast   CRANIOTOMY N/A 02/10/2015   Procedure: Removal of cranial plate with scalp wound revision;  Surgeon: Julio Sicks, MD;  Location: [facility];  Service: Neurosurgery;  Laterality: N/A;   DIAGNOSTIC LAPAROSCOPY     panniculectomy   PARTIAL HYSTERECTOMY     TONSILLECTOMY      Medical History: Past Medical History:  Diagnosis Date   Asthma    Brain tumor (benign) (HCC)    right side   Diabetes mellitus without complication (HCC)    borderline   Early cataracts, bilateral    Environmental and seasonal allergies    GERD (gastroesophageal reflux disease)    Hypertension    PMH:   On home oxygen therapy    pt is no longer  using oxygen at night   Seizures (HCC)    haven't had a seizure since brain surgery in 2001   Uterine cancer (HCC)     Family History: Family History  Problem Relation Age of Onset   Cancer Mother    Diabetes Father  Heart failure Father    Depression Sister    Stroke Sister    Depression Sister    Breast cancer Maternal Aunt    Cancer Brother    Cancer Brother    Cancer Brother    Diabetes Other     Social History: Social History   Socioeconomic History   Marital status: Widowed    Spouse name: Not on file   Number of children: Not on file   Years of education: Not on file   Highest education level: Not on file  Occupational History   Not on file  Tobacco Use   Smoking status: Former    Types: Cigarettes   Smokeless tobacco: Never   Tobacco comments:    quit smoking cigarettes 34 years ago  Vaping Use   Vaping  status: Never Used  Substance and Sexual Activity   Alcohol use: No   Drug use: No   Sexual activity: Not on file  Other Topics Concern   Not on file  Social History Narrative   Not on file   Social Determinants of Health   Financial Resource Strain: Low Risk  (03/15/2021)   Overall Financial Resource Strain (CARDIA)    Difficulty of Paying Living Expenses: Not hard at all  Food Insecurity: No Food Insecurity (03/15/2021)   Hunger Vital Sign    Worried About Running Out of Food in the Last Year: Never true    Ran Out of Food in the Last Year: Never true  Transportation Needs: No Transportation Needs (03/15/2021)   PRAPARE - Administrator, Civil Service (Medical): No    Lack of Transportation (Non-Medical): No  Physical Activity: Not on file  Stress: Not on file  Social Connections: Unknown (05/21/2021)   Received from [facility]   Social Network    Social Network: Not on file      Review of Systems  Constitutional:  Negative for chills, fatigue and unexpected weight change.  HENT:  Positive for postnasal drip. Negative for congestion, rhinorrhea, sneezing and sore throat.   Eyes:  Negative for redness.  Respiratory:  Negative for cough, chest tightness and shortness of breath.   Cardiovascular:  Negative for chest pain and palpitations.  Gastrointestinal:  Negative for abdominal pain, constipation, diarrhea, nausea and vomiting.  Genitourinary:  Negative for dysuria and frequency.  Musculoskeletal:  Negative for arthralgias, back pain, joint swelling and neck pain.  Skin:  Negative for rash.  Neurological: Negative.  Negative for tremors and numbness.  Hematological:  Negative for adenopathy. Does not bruise/bleed easily.  Psychiatric/Behavioral:  Negative for behavioral problems (Depression), sleep disturbance and suicidal ideas. The patient is not nervous/anxious.      Vital Signs: BP 120/75   Pulse 74   Temp 98.4 F (36.9 C)   Resp 16    Ht 5\' 1"  (1.549 m)   Wt 247 lb 3.2 oz (112.1 kg)   SpO2 97%   BMI 46.71 kg/m    Physical Exam Constitutional:      Appearance: Normal appearance.  HENT:     Head: Normocephalic and atraumatic.     Comments: Her incision site at the scalp has a metal fb, no signs of infection     Nose: Nose normal.     Mouth/Throat:     Mouth: Mucous membranes are moist.     Pharynx: No posterior oropharyngeal erythema.  Eyes:     Extraocular Movements: Extraocular movements intact.     Pupils: Pupils  are equal, round, and reactive to light.  Cardiovascular:     Rate and Rhythm: Normal rate and regular rhythm.     Pulses: Normal pulses.     Heart sounds: Normal heart sounds.  Pulmonary:     Effort: Pulmonary effort is normal.     Breath sounds: Normal breath sounds.  Musculoskeletal:     Right foot: Normal range of motion.     Left foot: Normal range of motion.  Feet:     Right foot:     Protective Sensation: 2 sites tested.  2 sites sensed.     Skin integrity: Skin integrity normal.     Toenail Condition: Right toenails are normal.     Left foot:     Protective Sensation: 2 sites tested.  2 sites sensed.     Skin integrity: Skin integrity normal.     Toenail Condition: Left toenails are normal.  Skin:    General: Skin is warm.     Comments: No rash under breast   Neurological:     General: No focal deficit present.     Mental Status: She is alert.  Psychiatric:        Mood and Affect: Mood normal.        Behavior: Behavior normal.        Assessment/Plan: 1. Encounter for general adult medical examination with abnormal findings All PHM is updated   2. Type 2 diabetes mellitus with hyperglycemia, without long-term current use of insulin (HCC) Continue therapy as before  - POCT HgB A1C( 7.1)  3. Mixed hyperlipidemia Continue Lipitor   4. Screening for colon cancer - Cologuard  5. Other primary ovarian failure - DG Bone Density; Future  6. Foreign body of skin of  scalp, sequela A small metal piece is protruding, however it is not causing her any pain, pt is going to monitor it    General Counseling: Shawntel verbalizes understanding of the findings of todays visit and agrees with plan of treatment. I have discussed any further diagnostic evaluation that may be needed or ordered today. We also reviewed her medications today. she has been encouraged to call the office with any questions or concerns that should arise related to todays visit.    Counseling:  [place] Controlled Substance Database was reviewed by me.  Orders Placed This Encounter  Procedures   DG Bone Density   Cologuard   POCT HgB A1C    No orders of the defined types were placed in this encounter.   Total time spent:35 Minutes  Time spent includes review of chart, medications, test results, and follow up plan with the patient.     Lyndon Code, MD  Internal Medicine

## 2022-11-29 ENCOUNTER — Ambulatory Visit: Payer: Medicare HMO | Admitting: Internal Medicine

## 2022-11-30 ENCOUNTER — Ambulatory Visit: Payer: Medicare HMO | Admitting: Nurse Practitioner

## 2022-11-30 DIAGNOSIS — E2839 Other primary ovarian failure: Secondary | ICD-10-CM | POA: Diagnosis not present

## 2022-11-30 DIAGNOSIS — M8589 Other specified disorders of bone density and structure, multiple sites: Secondary | ICD-10-CM | POA: Diagnosis not present

## 2022-11-30 DIAGNOSIS — M8588 Other specified disorders of bone density and structure, other site: Secondary | ICD-10-CM | POA: Diagnosis not present

## 2022-12-14 ENCOUNTER — Other Ambulatory Visit: Payer: Self-pay | Admitting: Nurse Practitioner

## 2022-12-18 ENCOUNTER — Other Ambulatory Visit: Payer: Self-pay | Admitting: Nurse Practitioner

## 2023-01-10 ENCOUNTER — Other Ambulatory Visit: Payer: Self-pay | Admitting: Nurse Practitioner

## 2023-01-30 ENCOUNTER — Inpatient Hospital Stay: Payer: HMO | Attending: Obstetrics and Gynecology | Admitting: Obstetrics and Gynecology

## 2023-01-30 VITALS — BP 159/83 | HR 89 | Temp 97.6°F | Resp 19 | Wt 245.7 lb

## 2023-01-30 DIAGNOSIS — R918 Other nonspecific abnormal finding of lung field: Secondary | ICD-10-CM | POA: Insufficient documentation

## 2023-01-30 DIAGNOSIS — Z800 Family history of malignant neoplasm of digestive organs: Secondary | ICD-10-CM | POA: Insufficient documentation

## 2023-01-30 DIAGNOSIS — Z6841 Body Mass Index (BMI) 40.0 and over, adult: Secondary | ICD-10-CM | POA: Insufficient documentation

## 2023-01-30 DIAGNOSIS — I10 Essential (primary) hypertension: Secondary | ICD-10-CM | POA: Diagnosis not present

## 2023-01-30 DIAGNOSIS — Z923 Personal history of irradiation: Secondary | ICD-10-CM | POA: Insufficient documentation

## 2023-01-30 DIAGNOSIS — Z08 Encounter for follow-up examination after completed treatment for malignant neoplasm: Secondary | ICD-10-CM

## 2023-01-30 DIAGNOSIS — Z8542 Personal history of malignant neoplasm of other parts of uterus: Secondary | ICD-10-CM | POA: Diagnosis not present

## 2023-01-30 DIAGNOSIS — L989 Disorder of the skin and subcutaneous tissue, unspecified: Secondary | ICD-10-CM | POA: Diagnosis not present

## 2023-01-30 DIAGNOSIS — Z87891 Personal history of nicotine dependence: Secondary | ICD-10-CM | POA: Diagnosis not present

## 2023-01-30 DIAGNOSIS — Z809 Family history of malignant neoplasm, unspecified: Secondary | ICD-10-CM | POA: Diagnosis not present

## 2023-01-30 DIAGNOSIS — Z803 Family history of malignant neoplasm of breast: Secondary | ICD-10-CM | POA: Insufficient documentation

## 2023-01-30 DIAGNOSIS — L308 Other specified dermatitis: Secondary | ICD-10-CM | POA: Diagnosis not present

## 2023-01-30 NOTE — Progress Notes (Signed)
Gynecologic Oncology Interval Visit   Referring Provider: Dr. Tiburcio Pea  Chief Complaint: 1A mixed clear cell/endometrioid endometrial carcinoma the endometrium status post TLH/BSO, radiation  Subjective:  Cassidy Parsons is a 78 y.o. 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 Initial pathology from endometrial biopsy was reviewed at [facility] and confirmed clear cell carcinoma. She returns to clinic for continued surveillance.    She continues to feel at baseline. Denies pelvic pain, vaginal bleeding, or changes in stool caliper. Her brother has now been diagnosed with liver cancer. Concerned for significant family history of multiple cancers.     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  Patient had ~ 5 year history of PMB, worse since May 2021 after she fell on her stomach. On August 26, 2023 underwent endometrial biopsy. - Clear cell carcinoma, FIGO grade 3   Pap 08-26-19- NILM  Husband died in 04/26/23 and she delayed self care because she was taking care of him.   CT scan C/A/P 08/07/19 did not show metastatic disease. Impression:  1. Heterogeneous and thickened appearance of the endometrium in keeping with patients known primary malignancy.  2. No definite evidence of metastatic disease in the chest, abdomen, or pelvis.  3. Bilateral pulmonary nodules, nonspecific. Recommend continued attention on follow-up.  4. Mild thickening of the sigmoid colon and tethering of the superior bladder wall, favored sequela of prior diverticulitis.   Underwent TLH, BSx/RO SLN mapping at [facility] 08/11/19 with Dr Johnnette Litter. Grade 2 endometrioid 4.2 cm cancer with 26% invasion. Negative right adnexa and cervix and no LVSI. Left adnexa could  not be removed due to dense adhesions of sigmoid colon secondary to severe diverticular disease.  Comment: The tumor demonstrates foci of well-formed endometrioid glands with defined nuclear polarity, but a considerable portion of the tumor appears solid, with large areas of squamous differentiation.  Morphologic features of clear cell carcinoma, such as marked nuclear atypia, hobnail architecture, hyalinized stroma, and intracytoplasmic eosinophilic globules, are not identified.  Secretory change is present in some cells, which stain positive for the markers Napsin A and AMACR.  The reported history of a prior biopsy with clear cell carcinoma is noted.  Review of this biopsy may be beneficial to exclude the possibility of a mixed clear cell/endometrioid tumor, if this diagnosis would influence patient management.  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   Slides were reviewed at [facility]. Dr. Johnnette Litter recommended adjuvant WPRT.   10/20/19-11/23/19 completed WPRT. Suffered diarrhea and fatigue during treatment and she has a h/o urinary frequency unrelieved by oxybutynin.    History of pulmonary nodules:  Previous imaging showed bilateral pulmonary nodules, 2-3 mm nonspecific on CT scan. Per patient, results were reassuring. [facility] radiology regarding read on 07/18/20 CT scan. No further follow up needed given stability.   09/19/2020 CT ABDOMEN AND PELVIS WITH CONTRAST  Soft tissue nodule at the right aspect of the aorta, anterior to the right ureter, measuring up to 10 x 8 x 24 mm (series 2, image 49 and series 6, image 118), which does not fill with contrast on the delayed sequence.   12/26/20 CT ABDOMEN AND PELVIS WITH CONTRAST Enlarged visualized portions of great saphenous veins with multiple associated distended anterior pelvic wall veins. 2.6 x 1.2  cm enlarged right retroperitoneal nodule or lymph node anterior to the mid ureter (5:59 and 2:51), previously 2.4 x  1. No new adenopathy.   01/12/21  PET 1. Soft tissue stranding of the right retroperitoneum with mild FDG uptake, likely postprocedural changes. Previously described enlarged right retroperitoneal lymph node is obscured and FDG avidity cannot be characterized. 2. No additional sites of hypermetabolic activity to suggest metastatic disease. 3. Sclerosis of the sacrum with associated linear lucencies and mild FDG uptake, findings are similar to prior exams and likely due to chronic sacral insufficiency fractures. 4.  Aortic Atherosclerosis (ICD10-I70.0).  06/02/21 Follow up CT was negative.  1. No evidence of metastatic disease in the abdomen or pelvis. 2. Cholelithiasis.  11/2021 She saw Dr. Rushie Chestnut, had a negative exam, and was released from Rad Onc.   She had mammogram 7/24 which had no mammographic evidence of malignancy. RECOMMENDATION: Screening mammogram in one year. (Code:[id number]-01Y) BI-RADS CATEGORY  1: Negative.  She previously declined additional colonoscopy.     Problem List: Patient Active Problem List   Diagnosis Date Noted   Primary clear cell adenocarcinoma of endometrium (HCC) 12/23/2019   Morbid obesity with BMI of 45.0-49.9, adult (HCC) 08/12/2019   Urinary tract infection without hematuria 08/07/2018   Dysuria 08/07/2018   Osteoarthritis of knee 04/11/2017   Low back pain 02/05/2017   Mild persistent asthma, uncomplicated 02/05/2017   Rhinitis, allergic 02/05/2017   GAD (generalized anxiety disorder) 02/05/2017   Unspecified complication of internal orthopedic prosthetic device, implant and graft, initial encounter (HCC) 02/05/2017   Diabetes mellitus without complication (HCC) 02/05/2017   Gout, unspecified 02/05/2017   Occlusion and stenosis of bilateral carotid arteries 02/05/2017   Insomnia, unspecified 02/05/2017   Sleep apnea 02/05/2017   Elevated blood-pressure reading without diagnosis of hypertension 02/05/2017   Dupuytren's disease of palm 10/05/2016    Exposed orthopaedic hardware (HCC) 02/10/2015    Past Medical History: Past Medical History:  Diagnosis Date   Asthma    Brain tumor (benign) (HCC)    right side   Diabetes mellitus without complication (HCC)    borderline   Early cataracts, bilateral    Environmental and seasonal allergies    GERD (gastroesophageal reflux disease)    Hypertension    PMH:   On home oxygen therapy    pt is no longer  using oxygen at night   Seizures (HCC)    haven't had a seizure since brain surgery in 2001   Uterine cancer (HCC)     Past Surgical History: Past Surgical History:  Procedure Laterality Date   BRAIN SURGERY     BREAST BIOPSY     BREAST SURGERY     biopsy right breast   CRANIOTOMY N/A 02/10/2015   Procedure: Removal of cranial plate with scalp wound revision;  Surgeon: Julio Sicks, MD;  Location: [facility];  Service: Neurosurgery;  Laterality: N/A;   DIAGNOSTIC LAPAROSCOPY     panniculectomy   PARTIAL HYSTERECTOMY     TONSILLECTOMY      Past Gynecologic History:  Menarche: age [age] Post menopausal Hx of post menopausal bleeding    OB History:  OB History  Gravida Para Term Preterm AB Living  2 1 1  1 1   SAB IAB Ectopic Multiple Live Births          # Outcome Date GA Lbr Len/2nd Weight Sex Type Anes PTL Lv  2 AB           1 Term  Family History: Family History  Problem Relation Age of Onset   Cancer Mother    Diabetes Father    Heart failure Father    Depression Sister    Stroke Sister    Depression Sister    Breast cancer Maternal Aunt    Cancer Brother    Cancer Brother    Cancer Brother    Diabetes Other    Social History: Social History   Socioeconomic History   Marital status: Widowed    Spouse name: Not on file   Number of children: Not on file   Years of education: Not on file   Highest education level: Not on file  Occupational History   Not on file  Tobacco Use   Smoking status: Former    Types: Cigarettes   Smokeless  tobacco: Never   Tobacco comments:    quit smoking cigarettes 34 years ago  Vaping Use   Vaping status: Never Used  Substance and Sexual Activity   Alcohol use: No   Drug use: No   Sexual activity: Not on file  Other Topics Concern   Not on file  Social History Narrative   Not on file   Social Drivers of Health   Financial Resource Strain: Low Risk  (03/15/2021)   Overall Financial Resource Strain (CARDIA)    Difficulty of Paying Living Expenses: Not hard at all  Food Insecurity: No Food Insecurity (03/15/2021)   Hunger Vital Sign    Worried About Running Out of Food in the Last Year: Never true    Ran Out of Food in the Last Year: Never true  Transportation Needs: No Transportation Needs (03/15/2021)   PRAPARE - Administrator, Civil Service (Medical): No    Lack of Transportation (Non-Medical): No  Physical Activity: Not on file  Stress: Not on file  Social Connections: Unknown (05/21/2021)   Received from [facility]   Social Network    Social Network: Not on file  Intimate Partner Violence: Unknown (04/12/2021)   [facility] from [facility], [facility]   HITS    Physically Hurt: Not on file    Insult or Talk Down To: Not on file    Threaten Physical Harm: Not on file    Scream or Curse: Not on file   Immunization History  Administered Date(s) Administered   Influenza-Unspecified 10/22/2016, 09/21/2019, 10/10/2020, 10/19/2021, 10/31/2022   Moderna Covid-19 Fall Seasonal Vaccine 10yrs & older 10/31/2022   Moderna SARS-COV2 Booster Vaccination 12/15/2019, 11/04/2020, 10/25/2021   Moderna Sars-Covid-2 Vaccination 03/02/2019, 03/31/2019, 06/28/2020   Pneumococcal Conjugate-13 07/23/2015   Pneumococcal Polysaccharide-23 02/18/2018   Zoster, Live 10/16/2012   Zoster, Unspecified 10/16/2012   Allergies: Allergies  Allergen Reactions   Dust Mite Extract    Mold Extract  [Trichophyton]    Molds & Smuts Other (See Comments)   Pollen Extract     Roxanol [Morphine] Other (See Comments)    Extreme pain   Meperidine Rash    Itching   Meperidine And Related Itching and Rash   Other Itching, Rash and Other (See Comments)    All pain relievers except hydrocodone Trees, mold, dust, and perfume allergies (sneezing, eyes swell up)   Propoxyphene Itching and Rash   Tape Itching and Rash    With paper tape.  Please use adhesive tape Rash   Tramadol Itching and Rash    Current Medications: Current Outpatient Medications  Medication Sig Dispense Refill   Accu-Chek Softclix Lancets lancets Use as  instructed once a daily DX E11.65 100 each 0   allopurinol (ZYLOPRIM) 100 MG tablet TAKE ONE TABLET BY MOUTH EVERY DAY 90 tablet 1   aspirin EC 81 MG tablet Take 81 mg by mouth daily.     atorvastatin (LIPITOR) 10 MG tablet 1 TABLET BY MOUTH TWICE A WEEK 90 tablet 1   azelastine (ASTELIN) 0.1 % nasal spray PLACE 1 SPRAY INTO EACH NOSTRIL DAILY ASDIRECTED. 30 mL 3   Blood Glucose Monitoring Suppl (ACCU-CHEK GUIDE) w/Device KIT Use as directed Dx E11.65 1 kit 0   Calcium Carb-Cholecalciferol 600-200 MG-UNIT TABS Take 1 tablet by mouth daily.     cetirizine (ZYRTEC) 10 MG tablet Take 10 mg by mouth daily.     clobetasol (TEMOVATE) 0.05 % external solution APPLY TOPICALLY TWICE A DAY 50 mL 3   clotrimazole-betamethasone (LOTRISONE) cream Apply 1 Application topically daily. 45 g 5   esomeprazole (NEXIUM) 40 MG capsule Take 40 mg by mouth daily at 12 noon.     fluticasone (FLONASE) 50 MCG/ACT nasal spray PLACE 1 SPRAY INTO BOTH NOSTRILS DAILY 16 g 3   glipiZIDE (GLUCOTROL XL) 5 MG 24 hr tablet TAKE ONE TABLET BY MOUTH EVERY DAY WITH BREAKFAST 90 tablet 1   glucose blood (ACCU-CHEK GUIDE) test strip Use as instructed once a daily  DX E11.65 100 each 1   ketoconazole (NIZORAL) 2 % shampoo APPLY 1 APPLICATION TOPICALLY TWICE A WEEK 120 mL 3   montelukast (SINGULAIR) 10 MG tablet TAKE ONE TABLET AT BEDTIME 90 tablet 1   Multiple Vitamins-Minerals  (MULTIVITAMIN ADULT PO) Take 1 tablet by mouth daily.     nystatin (MYCOSTATIN/NYSTOP) powder Apply 1 Application topically 3 (three) times daily as needed. 60 g 1   oxybutynin (DITROPAN) 5 MG tablet TAKE 1 TABLET BY MOUTH ONCE DAILY 90 tablet 0   No current facility-administered medications for this visit.   Review of Systems General:  no complaints Skin: no complaints Eyes: no complaints HEENT: no complaints Breasts: no complaints Pulmonary: no complaints Cardiac: no complaints Gastrointestinal: no complaints Genitourinary/Sexual: no complaints Ob/Gyn: no complaints Musculoskeletal: chronic back pain Hematology: no complaints Neurologic/Psych: no complaints   Objective:  Physical Examination:  BP (!) 159/83   Pulse 89   Temp 97.6 F (36.4 C)   Resp 19   Wt 245 lb 11.2 oz (111.4 kg)   SpO2 95%   BMI 46.42 kg/m    ECOG Performance Status: 1 - Symptomatic but completely ambulatory  GENERAL: Patient is a well appearing female in no acute distress HEENT:  Sclera clear. Anicteric NODES:  Negative axillary, supraclavicular, inguinal lymph node survery LUNGS:  Clear to auscultation bilaterally.   HEART:  Regular rate and rhythm.  ABDOMEN:  Soft, nontender.  No hernias, incisions well healed. No masses or ascites however exam limited d/t habitus.  EXTREMITIES:  No peripheral edema. Atraumatic. No cyanosis.  MSK: Limited ROM d/t chronic back pain.  SKIN:  Scattered erythematous lesions of skin folds consistent with hx of candidiasis. 2 mm darkened raised mole of right temple.  NEURO:  Nonfocal. Well oriented.  Appropriate affect.  Pelvic: Exam chaperoned by CMA EGBUS: no lesions Cervix: surgically absent Vagina: no lesions, no discharge or bleeding.  On palpation vaginal cuff 2 cm right of midline. Mobile and nontender. She could not tolerate a speculum exam as she could not place her feet in stirrups and the exam was very uncomfortable for her due to back and leg pain. She  had difficulty standing  and not able to get off the table without assistance.  Uterus: surgically absent BME: as noted on vaginal exam; no other masses palpated Rectovaginal: deferred given patient discomfort  Radiology No interval imaging   Assessment:  Cassidy Parsons is a 78 y.o. female diagnosed with 1A mixed clear cell/endometrioid endometrial carcinoma (MSI; dMMR with loss of MLH1/PMS2 with methylated MLH1 alleles present) s/p TLH, RSO 08/11/19. SLN mapping unsuccessful and left adnexa could not be removed due to severe adhesions from diverticular disease.  No LVSI, or cervical/adnexal involvement and invades 23% of the myometrium. Pre-op CT scan negative for metastatic disease. Treated with whole pelvic radiation 11/21.  Possible vaginal cuff mass, CT 09/2020 showed 2.4 nodule thought to represent lymph node, near IVC, above field of RT. CT 12/2020 increased to 2.6 cm. Biopsy was negative though no lymphatic tissue present. PET scan showed stranding of right retroperitoneum, mildly FDG active. Retroperitoneal LN was obscured and could not be characterized. Follow up CT 06/02/21 was negative. She is asymptomatic and clinically NED. Exam stable1.5-2 cm vaginal cuff mass possible postop change.  Follow-up imaging with CT scan on 06/02/2021 was negative. Persistent mass palpated today.    Pelvic exams are very difficult for her due to severe back pain with positioning. Given the extent of difficulty we will not continue pelvic exams but follow with imaging.   Pulmonary nodules, 07/2019 on CT chest, on follow up CT at [facility] 2022 report stable. No additional imaging was recommended.   Intertrigous dermatitis- persistent  Skin lesion of right temple.   Family history of cancer- brother recently diagnosed with new liver cancer  Medical co-morbidities complicating care: HTN, morbid obesity (Body mass index is 46.42 kg/m.), prediabetes, possible h/o carotid stenosis.  Plan:   Problem List  Items Addressed This Visit   None Visit Diagnoses       Encounter for follow-up surveillance of endometrial cancer    -  Primary   Relevant Orders   Ambulatory referral to Genetics   Ambulatory referral to Dermatology     Skin lesion of face       Relevant Orders   Ambulatory referral to Genetics   Ambulatory referral to Dermatology     Family history of cancer       Relevant Orders   Ambulatory referral to Genetics   Ambulatory referral to Dermatology      Given persistent vaginal cuff mass, recommend imaging. PET/CT and if too expensive for her we can obtain a CT scan instead.   Recommend no further pelvic exams due to difficulty with exams. Follow up in 6 months. If recurrent disease, she would be a candidate for chemotherapy or pembrolizumab/immunotherapy.   Skin lesion- recommended TBSE with dermatology. She's in agreement and referral sent.   Intertrigous dermatitis - persistent and improved  Family & personal history of malignancy- 6 of 9 siblings have now been diagnosed with cancer as well as her personal history. Recommend evaluation with genetic counselor to determine if testing is indicated. Patient declines genetics referral.   Consuello Masse, DNP, AGNP-C, AOCNP [facility] [phone] (clinic)   I personally had a face to face interaction and evaluated the patient.  I have reviewed her history and available records and have performed the entire physical exam. The assessment and plan which was fully formulated by me.    Dorine Duffey Leta Jungling, MD

## 2023-01-30 NOTE — Addendum Note (Signed)
Addended by: Ashley Royalty A on: 01/30/2023 01:50 PM   Modules accepted: Orders

## 2023-02-01 ENCOUNTER — Ambulatory Visit: Payer: HMO

## 2023-02-04 ENCOUNTER — Ambulatory Visit
Admission: RE | Admit: 2023-02-04 | Discharge: 2023-02-04 | Disposition: A | Discharge status: Home / Self Care | Payer: HMO | Source: Ambulatory Visit | Attending: Obstetrics and Gynecology | Admitting: Obstetrics and Gynecology

## 2023-02-04 DIAGNOSIS — Z08 Encounter for follow-up examination after completed treatment for malignant neoplasm: Secondary | ICD-10-CM | POA: Insufficient documentation

## 2023-02-04 DIAGNOSIS — Z8542 Personal history of malignant neoplasm of other parts of uterus: Secondary | ICD-10-CM | POA: Insufficient documentation

## 2023-02-05 ENCOUNTER — Telehealth: Payer: Self-pay

## 2023-02-06 ENCOUNTER — Other Ambulatory Visit: Payer: Self-pay | Admitting: Internal Medicine

## 2023-02-06 ENCOUNTER — Ambulatory Visit
Admission: RE | Admit: 2023-02-06 | Discharge: 2023-02-06 | Discharge status: Home / Self Care | Payer: HMO | Source: Ambulatory Visit | Attending: Obstetrics and Gynecology | Admitting: Obstetrics and Gynecology

## 2023-02-06 DIAGNOSIS — K8020 Calculus of gallbladder without cholecystitis without obstruction: Secondary | ICD-10-CM | POA: Diagnosis not present

## 2023-02-06 DIAGNOSIS — K449 Diaphragmatic hernia without obstruction or gangrene: Secondary | ICD-10-CM | POA: Diagnosis not present

## 2023-02-06 DIAGNOSIS — Z8542 Personal history of malignant neoplasm of other parts of uterus: Secondary | ICD-10-CM | POA: Diagnosis not present

## 2023-02-06 DIAGNOSIS — K5730 Diverticulosis of large intestine without perforation or abscess without bleeding: Secondary | ICD-10-CM | POA: Diagnosis not present

## 2023-02-06 DIAGNOSIS — Z08 Encounter for follow-up examination after completed treatment for malignant neoplasm: Secondary | ICD-10-CM | POA: Diagnosis not present

## 2023-02-06 LAB — POCT I-STAT CREATININE: Creatinine, Ser: 0.8 mg/dL (ref 0.44–1.00)

## 2023-02-06 MED ORDER — IOHEXOL 350 MG/ML SOLN
100.0000 mL | Freq: Once | INTRAVENOUS | Status: AC | PRN
  Administered 2023-02-06: 100 mL via INTRAVENOUS

## 2023-02-06 MED ORDER — RYBELSUS 3 MG PO TABS
3.0000 mg | ORAL_TABLET | Freq: Every day | ORAL | 3 refills | Status: DC
Start: 2023-02-06 — End: 2023-02-21

## 2023-02-06 NOTE — Telephone Encounter (Signed)
Patient stated she was on it before but the co pay was expensive so she stop it and stated that you told her the co pay would be less now.

## 2023-02-07 NOTE — Telephone Encounter (Signed)
Patient notified

## 2023-02-21 ENCOUNTER — Telehealth: Payer: Self-pay | Admitting: Nurse Practitioner

## 2023-02-21 NOTE — Telephone Encounter (Signed)
I reached out to [place], Cassidy Parsons's brother after receiving news of her unexpected passing. She says she appeared to have passed peacefully in her sleep. Expressed my condolences to the family for their loss.

## 2023-03-09 DEATH — deceased

## 2023-04-01 ENCOUNTER — Other Ambulatory Visit: Payer: Self-pay | Admitting: Nurse Practitioner

## 2023-04-05 ENCOUNTER — Other Ambulatory Visit: Payer: Self-pay | Admitting: Internal Medicine

## 2023-05-28 ENCOUNTER — Ambulatory Visit: Payer: Medicare HMO | Admitting: Nurse Practitioner

## 2023-07-31 ENCOUNTER — Ambulatory Visit: Payer: HMO

## 2023-12-02 ENCOUNTER — Ambulatory Visit: Payer: Medicare HMO | Admitting: Nurse Practitioner
